# Patient Record
Sex: Female | Born: 1980
Health system: Southern US, Community
[De-identification: ages and names within clinical notes are randomized; demographics above are authoritative.]

## PROBLEM LIST (undated history)

## (undated) DIAGNOSIS — G43909 Migraine, unspecified, not intractable, without status migrainosus: Secondary | ICD-10-CM

## (undated) DIAGNOSIS — K219 Gastro-esophageal reflux disease without esophagitis: Secondary | ICD-10-CM

## (undated) DIAGNOSIS — F4310 Post-traumatic stress disorder, unspecified: Secondary | ICD-10-CM

## (undated) DIAGNOSIS — F32A Depression, unspecified: Secondary | ICD-10-CM

## (undated) DIAGNOSIS — R112 Nausea with vomiting, unspecified: Secondary | ICD-10-CM

## (undated) DIAGNOSIS — R002 Palpitations: Secondary | ICD-10-CM

## (undated) DIAGNOSIS — B009 Herpesviral infection, unspecified: Secondary | ICD-10-CM

## (undated) DIAGNOSIS — T7840XA Allergy, unspecified, initial encounter: Secondary | ICD-10-CM

## (undated) DIAGNOSIS — Z87448 Personal history of other diseases of urinary system: Secondary | ICD-10-CM

## (undated) DIAGNOSIS — G5600 Carpal tunnel syndrome, unspecified upper limb: Secondary | ICD-10-CM

## (undated) DIAGNOSIS — F909 Attention-deficit hyperactivity disorder, unspecified type: Secondary | ICD-10-CM

## (undated) DIAGNOSIS — Z8619 Personal history of other infectious and parasitic diseases: Secondary | ICD-10-CM

## (undated) DIAGNOSIS — Z9889 Other specified postprocedural states: Secondary | ICD-10-CM

## (undated) DIAGNOSIS — D649 Anemia, unspecified: Secondary | ICD-10-CM

## (undated) DIAGNOSIS — N83209 Unspecified ovarian cyst, unspecified side: Secondary | ICD-10-CM

## (undated) DIAGNOSIS — F17200 Nicotine dependence, unspecified, uncomplicated: Secondary | ICD-10-CM

## (undated) DIAGNOSIS — R0600 Dyspnea, unspecified: Secondary | ICD-10-CM

## (undated) DIAGNOSIS — R5382 Chronic fatigue, unspecified: Secondary | ICD-10-CM

## (undated) DIAGNOSIS — F329 Major depressive disorder, single episode, unspecified: Secondary | ICD-10-CM

## (undated) DIAGNOSIS — R87629 Unspecified abnormal cytological findings in specimens from vagina: Secondary | ICD-10-CM

## (undated) DIAGNOSIS — F419 Anxiety disorder, unspecified: Secondary | ICD-10-CM

## (undated) HISTORY — DX: Dyspnea, unspecified: R06.00

## (undated) HISTORY — DX: Palpitations: R00.2

## (undated) HISTORY — PX: WISDOM TOOTH EXTRACTION: SHX21

## (undated) HISTORY — DX: Personal history of other infectious and parasitic diseases: Z86.19

## (undated) HISTORY — DX: Major depressive disorder, single episode, unspecified: F32.9

## (undated) HISTORY — PX: LEEP: SHX91

## (undated) HISTORY — DX: Personal history of other diseases of urinary system: Z87.448

## (undated) HISTORY — DX: Anxiety disorder, unspecified: F41.9

## (undated) HISTORY — DX: Unspecified abnormal cytological findings in specimens from vagina: R87.629

## (undated) HISTORY — PX: BACK SURGERY: SHX140

## (undated) HISTORY — DX: Depression, unspecified: F32.A

## (undated) HISTORY — PX: OTHER SURGICAL HISTORY: SHX169

## (undated) HISTORY — DX: Migraine, unspecified, not intractable, without status migrainosus: G43.909

## (undated) HISTORY — DX: Allergy, unspecified, initial encounter: T78.40XA

---
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
## 2000-07-02 ENCOUNTER — Emergency Department (HOSPITAL_COMMUNITY): Admission: EM | Admit: 2000-07-02 | Discharge: 2000-07-03 | Payer: Self-pay | Admitting: Emergency Medicine

## 2000-07-03 ENCOUNTER — Encounter: Payer: Self-pay | Admitting: Emergency Medicine

## 2000-12-18 ENCOUNTER — Other Ambulatory Visit: Admission: RE | Admit: 2000-12-18 | Discharge: 2000-12-18 | Payer: Self-pay | Admitting: Family Medicine

## 2001-04-17 ENCOUNTER — Encounter: Payer: Self-pay | Admitting: Otolaryngology

## 2001-04-17 ENCOUNTER — Encounter: Admission: RE | Admit: 2001-04-17 | Discharge: 2001-04-17 | Payer: Self-pay | Admitting: Otolaryngology

## 2001-05-03 ENCOUNTER — Encounter (INDEPENDENT_AMBULATORY_CARE_PROVIDER_SITE_OTHER): Payer: Self-pay | Admitting: *Deleted

## 2001-05-03 ENCOUNTER — Ambulatory Visit (HOSPITAL_BASED_OUTPATIENT_CLINIC_OR_DEPARTMENT_OTHER): Admission: RE | Admit: 2001-05-03 | Discharge: 2001-05-03 | Payer: Self-pay | Admitting: Hematology and Oncology

## 2005-11-08 ENCOUNTER — Emergency Department (HOSPITAL_COMMUNITY): Admission: EM | Admit: 2005-11-08 | Discharge: 2005-11-08 | Payer: Self-pay | Admitting: Emergency Medicine

## 2007-12-06 ENCOUNTER — Inpatient Hospital Stay (HOSPITAL_COMMUNITY): Admission: AD | Admit: 2007-12-06 | Discharge: 2007-12-06 | Payer: Self-pay | Admitting: Obstetrics and Gynecology

## 2008-01-25 ENCOUNTER — Inpatient Hospital Stay (HOSPITAL_COMMUNITY): Admission: AD | Admit: 2008-01-25 | Discharge: 2008-01-25 | Payer: Self-pay | Admitting: Obstetrics and Gynecology

## 2008-02-08 ENCOUNTER — Inpatient Hospital Stay (HOSPITAL_COMMUNITY): Admission: AD | Admit: 2008-02-08 | Discharge: 2008-02-09 | Payer: Self-pay | Admitting: Obstetrics and Gynecology

## 2008-02-09 ENCOUNTER — Inpatient Hospital Stay (HOSPITAL_COMMUNITY): Admission: AD | Admit: 2008-02-09 | Discharge: 2008-02-11 | Payer: Self-pay | Admitting: Obstetrics and Gynecology

## 2008-02-14 ENCOUNTER — Inpatient Hospital Stay (HOSPITAL_COMMUNITY): Admission: AD | Admit: 2008-02-14 | Discharge: 2008-02-14 | Payer: Self-pay | Admitting: Obstetrics & Gynecology

## 2009-01-16 ENCOUNTER — Inpatient Hospital Stay (HOSPITAL_COMMUNITY): Admission: AD | Admit: 2009-01-16 | Discharge: 2009-01-16 | Payer: Self-pay | Admitting: Obstetrics & Gynecology

## 2009-01-27 ENCOUNTER — Encounter: Payer: Self-pay | Admitting: *Deleted

## 2009-02-06 ENCOUNTER — Inpatient Hospital Stay (HOSPITAL_COMMUNITY): Admission: AD | Admit: 2009-02-06 | Discharge: 2009-02-06 | Payer: Self-pay | Admitting: Obstetrics and Gynecology

## 2009-02-11 ENCOUNTER — Inpatient Hospital Stay (HOSPITAL_COMMUNITY): Admission: RE | Admit: 2009-02-11 | Discharge: 2009-02-12 | Payer: Self-pay | Admitting: Obstetrics and Gynecology

## 2010-02-25 ENCOUNTER — Encounter: Admission: RE | Admit: 2010-02-25 | Discharge: 2010-02-25 | Payer: Self-pay | Admitting: Otolaryngology

## 2010-04-25 ENCOUNTER — Encounter
Admission: RE | Admit: 2010-04-25 | Discharge: 2010-04-25 | Payer: Self-pay | Source: Home / Self Care | Attending: Emergency Medicine | Admitting: Emergency Medicine

## 2010-08-11 LAB — CBC
HCT: 32.2 % — ABNORMAL LOW (ref 36.0–46.0)
HCT: 35.1 % — ABNORMAL LOW (ref 36.0–46.0)
Hemoglobin: 10.7 g/dL — ABNORMAL LOW (ref 12.0–15.0)
Hemoglobin: 12 g/dL (ref 12.0–15.0)
MCHC: 33.3 g/dL (ref 30.0–36.0)
MCHC: 34.1 g/dL (ref 30.0–36.0)
MCV: 92.7 fL (ref 78.0–100.0)
MCV: 93.4 fL (ref 78.0–100.0)
Platelets: 176 10*3/uL (ref 150–400)
Platelets: 185 10*3/uL (ref 150–400)
RBC: 3.45 MIL/uL — ABNORMAL LOW (ref 3.87–5.11)
RBC: 3.79 MIL/uL — ABNORMAL LOW (ref 3.87–5.11)
RDW: 13.9 % (ref 11.5–15.5)
RDW: 14.2 % (ref 11.5–15.5)
WBC: 11.4 10*3/uL — ABNORMAL HIGH (ref 4.0–10.5)
WBC: 11.9 10*3/uL — ABNORMAL HIGH (ref 4.0–10.5)

## 2010-08-11 LAB — RPR: RPR Ser Ql: NONREACTIVE

## 2010-09-20 NOTE — H&P (Signed)
Abigail White, Abigail White              ACCOUNT NO.:  [id number]   MEDICAL RECORD NO.:  [id number]          PATIENT TYPE:  INP   LOCATION:  9126                          FACILITY:  [facility]   PHYSICIAN:  Lenoard Aden, M.D.DATE OF BIRTH:  08-19-1980   DATE OF ADMISSION:  02/09/2008  DATE OF DISCHARGE:                              HISTORY & PHYSICAL   CHIEF COMPLAINT:  Induction at 39 weeks.   Abigail White is a 30 year old white female G2, P0 at 39 and 5/7th weeks'  gestation by ultrasound, adjusted dates with unsure LMP who presents for  induction.  The patient currently lives in [place].  Her husband is  in the [employer].  Given her advanced distance 2-1/2 hours away from the  hospital, her advanced dilatation of 4-5 cm, her husband is need to be  out of town and deployed [employer] as of this week, and her GBS  positivity; a decision is made to proceed with induction at 39 weeks.   SOCIAL HISTORY:  Abigail White is a reformed smoker.  Abigail White is a nondrinker.  Abigail White  denies any current domestic or physical violence.   ALLERGIES:  1. CODEINE.  2. PENICILLIN.  3. LATEX.   Abigail White has a personal history of situational depression and no other  medical or surgical issues.   Abigail White has 1 uncomplicated TAB in 1999 at 10 weeks.   FAMILY HISTORY:  Heart disease, hypertension, diabetes, bone and breast  cancer, kidney stones, stroke, thyroid disease, and kidney disease.   Prenatal course has been complicated by preterm labor, advanced  dilatation, and GBS positivity.   PHYSICAL EXAMINATION:  GENERAL:  Abigail White is a well-developed and well-  nourished white female, in no acute distress.  HEENT:  Normal.  NECK:  Supple.  Full range of motion.  LUNGS:  Clear.  HEART:  Regular rhythm.  ABDOMEN:  Soft, gravid, and nontender.  Estimated fetal weight is 7-1/2  to 8 pounds.  Cervix is 4-5 cm, 80% vertex, -1, soft and mid position.  EXTREMITIES:  There are no cords.  NEUROLOGIC:  Nonfocal.  SKIN:  Intact.   IMPRESSION:  1. A 39-week intrauterine pregnancy.  2. Advanced geographic distance from the hospital.  3. Social indications to include husband deployed in the [employer].  4. Advanced dilatation.  5. Group B streptococcus positive.   PLAN:  Proceed with Pitocin induction and epidural as needed.  Anticipate attempts at vaginal delivery.      Lenoard Aden, M.D.  Electronically Signed     RJT/MEDQ  D:  02/09/2008  T:  02/10/2008  Job:  [id number]

## 2010-09-20 NOTE — H&P (Signed)
NAMESHELIE, Abigail White              ACCOUNT NO.:  [id number]   MEDICAL RECORD NO.:  [id number]          PATIENT TYPE:  MAT   LOCATION:  MATC                          [facility]:  [facility]   PHYSICIAN:  Lenoard Aden, M.D.DATE OF BIRTH:  1980-11-30   DATE OF ADMISSION:  01/25/2008  DATE OF DISCHARGE:  01/25/2008                              HISTORY & PHYSICAL   CHIEF COMPLAINT:  Labor.   She is a 26-year white female, G2, P0, 36 weeks' gestation with  increased frequency of contractions, questionable leakage of fluid.   ALLERGIES:  To CODEINE, PENICILLIN, LATEX.   MEDICATIONS:  1. Procardia as needed.  2. Preterm vitamins.  3. Tums.   She is a nonsmoker, nondrinker.  Denies domestic or physical violations.  She has a family history of heart disease, hypertension, bone and breast  cancer, stroke, kidney stones, thyroid disease.  Prenatal course  complicated by preterm cervical change.   PHYSICAL EXAMINATION:  GENERAL:  She is uncomfortable-appearing white  female, in no acute distress.  HEENT: Normal lungs, clear.  HEART: Regular rhythm.  ABDOMEN:  Soft, gravid, nontender.  Cervix is 2 cm, 60%, vertex -1.  EXTREMITIES:  There are no cords.  NEUROLOGICAL:  Nonfocal.  SKIN:  Intact.   NST is reactive.  Her fetal heart sounds 50 beats per minute,  accelerations noted, no decelerations, irregular contractions noted.   IMPRESSION:  1. A 36 weeks' intrauterine pregnancy.  2. Prodromal labor pattern, no evidence of cervical change.   PLAN:  Discharge home, preterm labor warning is given.  Procardia p.r.n.  until 37 weeks, followup in the office as scheduled.      Lenoard Aden, M.D.  Electronically Signed     RJT/MEDQ  D:  01/25/2008  T:  01/25/2008  Job:  [id number]

## 2010-09-23 NOTE — Op Note (Signed)
[place]  Patient:    Abigail White, Abigail White Visit Number: [id number] MRN: [id number]          Service Type: DSU Location: [facility] Attending Physician:  Dalene Seltzer Dictated by:   Jeannett Senior Pollyann Kennedy, M.D. Proc. Date: 05/03/01 Admit Date:  05/03/2001   CC:         Elvina Sidle, M.D.   Operative Report  PREOPERATIVE DIAGNOSIS:  Chronic ethmoid, maxillary, and frontal sinusitis.  POSTOPERATIVE DIAGNOSIS:  Chronic ethmoid, maxillary, and frontal sinusitis.  PROCEDURES: 1. Bilateral total endoscopic ethmoidectomy. 2. Bilateral endoscopic maxillary antrostomy. 3. Bilateral endoscopic frontal sinusotomy.  SURGEON:  Jefry H. Pollyann Kennedy, M.D.  ANESTHESIA:  General endotracheal anesthesia.  COMPLICATIONS:  None.  ESTIMATED BLOOD LOSS:  50 cc.  FINDINGS:  Diffuse inflammatory changes of mucosa involving the anterior and posterior ethmoid cell, the maxillary sinuses and the frontal sinuses bilaterally.  There was polypoid change of the mucosa and isolated large polyps particularly in the frontal sinus recess area bilaterally.  There was severely thickened and inflamed polypoid mucosa of the right maxillary sinus. There were multiple ethmoid cells that were filled with inspissated mucus.  REFERRING PHYSICIAN:  Dr. Elvina Sidle.  The patient tolerated the procedure well and was awakened, extubated, and transferred to recovery in stable condition.  INDICATION FOR PROCEDURE:  This is a 30 year old with a history of chronic pansinusitis, who has failed prolonged medical management with antibiotic therapy.  Risks, benefits, alternatives, and complications of the procedure were explained to the patient, who seemed to understand and agreed to surgery.  PROCEDURE:  The patient was taken to the operating room, placed on the operating table in supine position.  Following induction of general endotracheal anesthesia, the patient was prepped and draped  in a standard fashion.  #1 - Bilateral total endoscopic ethmoidectomy.  Oxymetazoline spray was used preoperatively in the nose.  Xylocaine 1% with epinephrine was infiltrated into the superior and posterior attachments of the middle turbinates bilaterally.  It was also infiltrated into the lateral nasal wall in the infundibular area.  The uncinate process was taken down using a sickle knife on both sides.  This allowed easier access into the infundibulum and exposure of the ethmoid bullae.  The ethmoid bullae were opened and cleaned of thickened mucosa.  A complete ethmoidectomy was then performed using a combination of sinus endoscopy forceps and the microdebrider.  The lateral limit of dissection was the lamina papyracea, which was kept intact bilaterally.  The superior limit of dissection was the fovea ethmoidalis laterally and the cribriform plate medially, which was also kept intact.  The posterior limit of dissection after passing through the ground lamella on both sides and opening the posterior ethmoid cells was the face of the sphenoid, which was kept intact.  The sphenoid ostium was identified on the left side, but was left unmolested as there was no history of chronic sphenoid disease. The ostium was not identified specifically on the right side.  The middle turbinate was thin and kept intact bilaterally.  There was mild to moderate deviation of the superior septum towards the right side, which made access into the right ethmoid slightly difficult at first, but after cleaning out the lateral cells made it much easier and there was sufficient room for the dissection.  #2 - Bilateral endoscopic maxillary antrostomy.  After the uncinectomy was performed, then the bulla was opened.  The natural ostium of the maxillary sinus was easily identified bilaterally.  It was  entered with a curved suction.  A large amount of inspissated mucus was aspirated from the left side, the right  side had chronic polypoid changes.  The ostium was enlarged anteriorly using the backbiting forceps up to the lacrimal bone and posteriorly using a straight through-cut forcep back to the confluence with the laminae papyracea.  #3 - Bilateral endoscopic frontal sinusotomy.  After having completed the anterior aspect of the ethmoid dissection, the frontal recess area was easily identified using the 30 degree scope and curved suction.  There was a large amount of polypoid tissue on both sides that was cleaned out from within the inferior aspect of the frontal sinus.  The bony ledge that was arising from anterior to the frontal sinus recess was taken down with a up-angled Wilde-Blakesley forceps to allow easier access into the frontal sinus on both sides.  The ethmoid cavities were suctioned of debris and blood and packed with Kennedy packs inflated with the local anesthetic solution.  The nasal cavity and pharynx were suctioned of blood and secretions and the patient was then awakened, extubated and transferred to recovery room. Dictated by:   Jeannett Senior Pollyann Kennedy, M.D. Attending Physician:  Dalene Seltzer DD:  05/03/01 TD:  05/03/01 Job: [id number]

## 2010-10-11 ENCOUNTER — Other Ambulatory Visit: Payer: Self-pay | Admitting: Obstetrics and Gynecology

## 2011-02-06 LAB — URINALYSIS, ROUTINE W REFLEX MICROSCOPIC
Bilirubin Urine: NEGATIVE
Glucose, UA: NEGATIVE
Hgb urine dipstick: NEGATIVE
Ketones, ur: NEGATIVE
Nitrite: NEGATIVE
Protein, ur: NEGATIVE
Specific Gravity, Urine: <1.005 — ABNORMAL LOW
Urobilinogen, UA: 0.2
pH: 7

## 2011-02-07 LAB — RH IMMUNE GLOB WKUP(>/=20WKS)(NOT WOMEN'S HOSP): Fetal Screen: NEGATIVE

## 2011-02-07 LAB — CBC
HCT: 30 — ABNORMAL LOW
HCT: 33.8 — ABNORMAL LOW
Hemoglobin: 10.1 — ABNORMAL LOW
Hemoglobin: 11.3 — ABNORMAL LOW
MCHC: 33.4
MCHC: 33.8
MCV: 90.8
MCV: 93.3
Platelets: 181
Platelets: 243
RBC: 3.21 — ABNORMAL LOW
RBC: 3.73 — ABNORMAL LOW
RDW: 12.3
RDW: 12.8
WBC: 11.2 — ABNORMAL HIGH
WBC: 15.9 — ABNORMAL HIGH

## 2011-02-07 LAB — RPR: RPR Ser Ql: NONREACTIVE

## 2011-08-11 ENCOUNTER — Encounter: Payer: Self-pay | Admitting: *Deleted

## 2011-08-11 DIAGNOSIS — R002 Palpitations: Secondary | ICD-10-CM | POA: Insufficient documentation

## 2011-08-11 DIAGNOSIS — R079 Chest pain, unspecified: Secondary | ICD-10-CM | POA: Insufficient documentation

## 2011-08-11 DIAGNOSIS — F419 Anxiety disorder, unspecified: Secondary | ICD-10-CM | POA: Insufficient documentation

## 2011-08-11 DIAGNOSIS — F329 Major depressive disorder, single episode, unspecified: Secondary | ICD-10-CM | POA: Insufficient documentation

## 2011-08-11 DIAGNOSIS — F32A Depression, unspecified: Secondary | ICD-10-CM | POA: Insufficient documentation

## 2011-08-11 DIAGNOSIS — R0600 Dyspnea, unspecified: Secondary | ICD-10-CM | POA: Insufficient documentation

## 2011-09-07 ENCOUNTER — Other Ambulatory Visit: Payer: Self-pay | Admitting: Dermatology

## 2011-09-07 ENCOUNTER — Ambulatory Visit (INDEPENDENT_AMBULATORY_CARE_PROVIDER_SITE_OTHER): Payer: 59 | Admitting: Family Medicine

## 2011-09-07 VITALS — BP 94/58 | HR 78 | Temp 98.6°F | Resp 16 | Ht 67.0 in | Wt 159.0 lb

## 2011-09-07 DIAGNOSIS — G5600 Carpal tunnel syndrome, unspecified upper limb: Secondary | ICD-10-CM

## 2011-09-07 NOTE — Progress Notes (Signed)
  Subjective:    Patient ID: Abigail White, female    DOB: April 06, 1981, 31 y.o.   MRN: [id number]  HPI  Patient presents complaining of numbness in (B) wrists Wrist pain present throughout the day worse at night. Intermittent paresthesias (B) 4 th and 5 th digit  Dx with carpal tunnel in the past; given splints in the past however cannot locate them.  Has never had NCS  SH/ Dialysis RN at [facility]  Review of Systems     Objective:   Physical Exam  Constitutional: She appears well-developed.  Neck: No thyromegaly present.  Cardiovascular: Normal rate, regular rhythm and normal heart sounds.   Pulmonary/Chest: Effort normal and breath sounds normal.  Neurological: She is alert.       Positive tinels and phalens (B) No evidence of thenar wasting          Assessment & Plan:   1. Carpal tunnel syndrome  Wrist splint, Ambulatory referral to Orthopedic Surgery   Anticipatory guidance

## 2011-10-24 ENCOUNTER — Ambulatory Visit (INDEPENDENT_AMBULATORY_CARE_PROVIDER_SITE_OTHER): Payer: 59 | Admitting: Emergency Medicine

## 2011-10-24 VITALS — BP 102/65 | HR 86 | Temp 97.9°F | Resp 18 | Ht 68.0 in | Wt 165.0 lb

## 2011-10-24 DIAGNOSIS — K2100 Gastro-esophageal reflux disease with esophagitis, without bleeding: Secondary | ICD-10-CM

## 2011-10-24 MED ORDER — ESOMEPRAZOLE MAGNESIUM 40 MG PO CPDR: 40.0000 mg | DELAYED_RELEASE_CAPSULE | Freq: Every day | ORAL | Status: DC

## 2011-10-24 MED ORDER — SUCRALFATE 1 G PO TABS: 1.0000 g | ORAL_TABLET | Freq: Four times a day (QID) | ORAL | Status: DC

## 2011-10-24 NOTE — Progress Notes (Signed)
  Subjective:    Patient ID: Abigail White, female    DOB: 1980/09/01, 31 y.o.   MRN: [id number]  Gastrophageal Reflux She complains of abdominal pain, coughing, dysphagia and heartburn. She reports no belching, no chest pain, no choking, no early satiety, no globus sensation, no hoarse voice, no nausea, no sore throat, no stridor, no tooth decay, no water brash or no wheezing. This is a new problem. The current episode started 1 to 4 weeks ago. The problem occurs constantly. The problem has been unchanged. The heartburn duration is an hour. The heartburn is located in the substernum. The heartburn is of moderate intensity. The heartburn wakes her from sleep. The heartburn does not limit her activity. The heartburn doesn't change with position. The symptoms are aggravated by lying down. Pertinent negatives include no anemia, fatigue, melena, muscle weakness, orthopnea or weight loss. Risk factors include no known risk factors. She has tried an antacid for the symptoms. The treatment provided no relief. Past procedures do not include an abdominal ultrasound, esophageal manometry, H. pylori antibody titer or a UGI.      Review of Systems  Constitutional: Positive for activity change, appetite change and unexpected weight change. Negative for fever, chills, weight loss and fatigue.  HENT: Negative.  Negative for sore throat and hoarse voice.   Eyes: Negative.   Respiratory: Positive for cough. Negative for choking and wheezing.   Cardiovascular: Negative.  Negative for chest pain.  Gastrointestinal: Positive for heartburn, dysphagia and abdominal pain. Negative for nausea, vomiting, diarrhea, constipation, blood in stool, melena and anal bleeding.  Genitourinary: Negative.   Musculoskeletal: Negative.  Negative for muscle weakness.  Skin: Negative.   All other systems reviewed and are negative.       Objective:   Physical Exam  Nursing note and vitals reviewed. Constitutional: She is  oriented to person, place, and time. She appears well-developed and well-nourished.  HENT:  Head: Normocephalic and atraumatic.  Right Ear: External ear normal.  Left Ear: External ear normal.  Eyes: Conjunctivae are normal. Pupils are equal, round, and reactive to light.  Neck: Normal range of motion. Neck supple.  Cardiovascular: Regular rhythm and normal heart sounds.   Pulmonary/Chest: Effort normal and breath sounds normal.  Abdominal: Soft.  Musculoskeletal: Normal range of motion.  Neurological: She is alert and oriented to person, place, and time.  Skin: Skin is warm and dry.          Assessment & Plan:  History of GERD treated previously with protonix F/U with [facility]MD

## 2011-11-06 ENCOUNTER — Ambulatory Visit (INDEPENDENT_AMBULATORY_CARE_PROVIDER_SITE_OTHER): Payer: 59 | Admitting: Family Medicine

## 2011-11-06 VITALS — BP 114/72 | HR 78 | Temp 98.3°F | Resp 16 | Ht 68.0 in | Wt 167.0 lb

## 2011-11-06 DIAGNOSIS — K219 Gastro-esophageal reflux disease without esophagitis: Secondary | ICD-10-CM

## 2011-11-06 DIAGNOSIS — J329 Chronic sinusitis, unspecified: Secondary | ICD-10-CM

## 2011-11-06 DIAGNOSIS — R059 Cough, unspecified: Secondary | ICD-10-CM

## 2011-11-06 DIAGNOSIS — M2550 Pain in unspecified joint: Secondary | ICD-10-CM

## 2011-11-06 DIAGNOSIS — R635 Abnormal weight gain: Secondary | ICD-10-CM

## 2011-11-06 DIAGNOSIS — R05 Cough: Secondary | ICD-10-CM

## 2011-11-06 LAB — POCT CBC
Granulocyte percent: 72.1 %G (ref 37–80)
HCT, POC: 45.3 % (ref 37.7–47.9)
Hemoglobin: 14.2 g/dL (ref 12.2–16.2)
Lymph, poc: 3.4 (ref 0.6–3.4)
MCH, POC: 30.3 pg (ref 27–31.2)
MCHC: 31.3 g/dL — AB (ref 31.8–35.4)
MCV: 96.5 fL (ref 80–97)
MID (cbc): 0.8 (ref 0–0.9)
MPV: 8.5 fL (ref 0–99.8)
POC Granulocyte: 11 — AB (ref 2–6.9)
POC LYMPH PERCENT: 22.5 %L (ref 10–50)
POC MID %: 5.4 %M (ref 0–12)
Platelet Count, POC: 347 10*3/uL (ref 142–424)
RBC: 4.69 M/uL (ref 4.04–5.48)
RDW, POC: 11.8 %
WBC: 15.2 10*3/uL — AB (ref 4.6–10.2)

## 2011-11-06 LAB — POCT SEDIMENTATION RATE: POCT SED RATE: 26 mm/hr — AB (ref 0–22)

## 2011-11-06 MED ORDER — LEVOFLOXACIN 500 MG PO TABS: 500.0000 mg | ORAL_TABLET | Freq: Every day | ORAL | Status: AC

## 2011-11-06 MED ORDER — HYDROCOD POLST-CHLORPHEN POLST 10-8 MG/5ML PO LQCR: 5.0000 mL | Freq: Every evening | ORAL | Status: DC | PRN

## 2011-11-06 MED ORDER — SUCRALFATE 1 GM/10ML PO SUSP: 1.0000 g | Freq: Four times a day (QID) | ORAL | Status: AC

## 2011-11-06 MED ORDER — PANTOPRAZOLE SODIUM 40 MG PO TBEC: 40.0000 mg | DELAYED_RELEASE_TABLET | Freq: Every day | ORAL | Status: DC

## 2011-11-06 NOTE — Progress Notes (Signed)
Gastrophageal Reflux  She complains of abdominal pain, coughing, dysphagia and heartburn. She reports no belching, no chest pain, no choking, no early satiety, no globus sensation, no hoarse voice, no nausea, no sore throat, no stridor, no tooth decay, no water brash or no wheezing. This is a new problem. The current episode started 1 to 4 weeks ago. The problem occurs constantly. The problem has been unchanged. The heartburn duration is an hour. The heartburn is located in the substernum. The heartburn is of moderate intensity. The heartburn wakes her from sleep. The heartburn does not limit her activity. The heartburn doesn't change with position. The symptoms are aggravated by lying down. Pertinent negatives include no anemia, fatigue, melena, muscle weakness, orthopnea or weight loss. Risk factors include no known risk factors. She has tried an antacid for the symptoms. The treatment provided no relief. Past procedures do not include an abdominal ultrasound, esophageal manometry, H. pylori antibody titer or a UGI.   Patient has been gaining weight since she moved in January.  She keeps gaining and is  Very upset.  She asked gynecologist for help but no help forthcoming.  Mother had thyroid problem in her thirties. She has stopped eating in bed.  Patient has two weeks of sinus congestion.  Thick green mucous has been associated.  Having chest pains and increasing cough.  Still smoking, but trying to quit  Also, has hand pain. Both writst, baseof thumbs.  Seeing orthopedic on July 8th.  Using brace.  Positive f/h for fibromyalgia  Objective: HEENT:  Thick mucous both nasal disch.  Otherwise neg Chest:  No rales. Heart:  Reg, no murmur Ext:  No edema Skin: normal  Assessment:  Sinus, GERD, weight  Gain  Plan:    1. Sinusitis  POCT CBC, levofloxacin (LEVAQUIN) 500 MG tablet  2. Weight gain  TSH  3. GERD (gastroesophageal reflux disease)  sucralfate (CARAFATE) 1 GM/10ML suspension, pantoprazole  (PROTONIX) 40 MG tablet  4. Cough  chlorpheniramine-HYDROcodone (TUSSIONEX PENNKINETIC ER) 10-8 MG/5ML [facility]

## 2011-11-07 LAB — TSH: TSH: 1.013 u[IU]/mL (ref 0.350–4.500)

## 2011-11-08 LAB — ANA: Anti Nuclear Antibody(ANA): NEGATIVE

## 2011-11-08 LAB — RHEUMATOID FACTOR: Rhuematoid fact SerPl-aCnc: <10 IU/mL (ref <=14)

## 2011-12-31 ENCOUNTER — Ambulatory Visit (HOSPITAL_COMMUNITY)
Admission: RE | Admit: 2011-12-31 | Discharge: 2011-12-31 | Disposition: A | Discharge status: Home / Self Care | Payer: 59 | Source: Ambulatory Visit | Attending: Family Medicine | Admitting: Family Medicine

## 2011-12-31 ENCOUNTER — Ambulatory Visit (HOSPITAL_COMMUNITY)
Admission: EM | Admit: 2011-12-31 | Discharge: 2011-12-31 | Disposition: A | Discharge status: Home / Self Care | Payer: 59 | Source: Ambulatory Visit | Attending: Family Medicine | Admitting: Family Medicine

## 2011-12-31 ENCOUNTER — Ambulatory Visit (INDEPENDENT_AMBULATORY_CARE_PROVIDER_SITE_OTHER): Payer: 59 | Admitting: Family Medicine

## 2011-12-31 ENCOUNTER — Ambulatory Visit: Payer: 59

## 2011-12-31 VITALS — BP 116/72 | HR 111 | Temp 98.5°F | Resp 16 | Ht 67.0 in | Wt 161.0 lb

## 2011-12-31 DIAGNOSIS — R1031 Right lower quadrant pain: Secondary | ICD-10-CM

## 2011-12-31 DIAGNOSIS — X58XXXA Exposure to other specified factors, initial encounter: Secondary | ICD-10-CM | POA: Insufficient documentation

## 2011-12-31 DIAGNOSIS — N83209 Unspecified ovarian cyst, unspecified side: Secondary | ICD-10-CM | POA: Insufficient documentation

## 2011-12-31 DIAGNOSIS — R110 Nausea: Secondary | ICD-10-CM

## 2011-12-31 DIAGNOSIS — T8339XA Other mechanical complication of intrauterine contraceptive device, initial encounter: Secondary | ICD-10-CM | POA: Insufficient documentation

## 2011-12-31 DIAGNOSIS — D72829 Elevated white blood cell count, unspecified: Secondary | ICD-10-CM

## 2011-12-31 LAB — POCT URINALYSIS DIPSTICK
Bilirubin, UA: NEGATIVE
Blood, UA: NEGATIVE
Glucose, UA: NEGATIVE
Ketones, UA: NEGATIVE
Nitrite, UA: NEGATIVE
Protein, UA: NEGATIVE
Spec Grav, UA: 1.02
Urobilinogen, UA: 0.2
pH, UA: 7

## 2011-12-31 LAB — POCT CBC
Granulocyte percent: 90.1 %G — AB (ref 37–80)
HCT, POC: 46 % (ref 37.7–47.9)
Hemoglobin: 14.3 g/dL (ref 12.2–16.2)
Lymph, poc: 1.3 (ref 0.6–3.4)
MCH, POC: 30 pg (ref 27–31.2)
MCHC: 31.1 g/dL — AB (ref 31.8–35.4)
MCV: 96.4 fL (ref 80–97)
MID (cbc): 0.4 (ref 0–0.9)
MPV: 9.4 fL (ref 0–99.8)
POC Granulocyte: 15.5 — AB (ref 2–6.9)
POC LYMPH PERCENT: 7.7 %L — AB (ref 10–50)
POC MID %: 2.2 %M (ref 0–12)
Platelet Count, POC: 327 10*3/uL (ref 142–424)
RBC: 4.77 M/uL (ref 4.04–5.48)
RDW, POC: 12.4 %
WBC: 17.2 10*3/uL — AB (ref 4.6–10.2)

## 2011-12-31 LAB — POCT UA - MICROSCOPIC ONLY
Bacteria, U Microscopic: NEGATIVE
Casts, Ur, LPF, POC: NEGATIVE
Crystals, Ur, HPF, POC: NEGATIVE
Mucus, UA: NEGATIVE
Yeast, UA: NEGATIVE

## 2011-12-31 MED ORDER — IOHEXOL 300 MG/ML  SOLN
100.0000 mL | Freq: Once | INTRAMUSCULAR | Status: AC | PRN
  Administered 2011-12-31: 100 mL via INTRAVENOUS

## 2011-12-31 MED ORDER — IOHEXOL 300 MG/ML  SOLN
40.0000 mL | Freq: Once | INTRAMUSCULAR | Status: AC | PRN
  Administered 2011-12-31: 40 mL via ORAL

## 2011-12-31 MED ORDER — TRAMADOL HCL 50 MG PO TABS: 50.0000 mg | ORAL_TABLET | Freq: Three times a day (TID) | ORAL | Status: AC | PRN

## 2011-12-31 MED ORDER — CIPROFLOXACIN HCL 500 MG PO TABS: 500.0000 mg | ORAL_TABLET | Freq: Two times a day (BID) | ORAL | Status: AC

## 2011-12-31 NOTE — Progress Notes (Signed)
[facility]:  Office Visit  Chief Complaint:  Chief Complaint  Patient presents with  . Abdominal Pain    RLQ since yesterday afternoon    HPI: Abigail White is a 31 y.o. female who complains of  1 day h/o RLQ abd pain, sharp. Denies fevers, chills. + nausea. Tried Tramadol without relief. No urinary or vagina sxs. Denies ovarian cyst. Denies  H/o abdominal surgery, ob/gyn thought she may have had endometriosis and was planning to do what sounds like an exploratory lap but she did not do it. Feels warm. Tried leftover levaquin and 1 pill of prednisone without relief because she thought she had a URI.   Past Medical History  Diagnosis Date  . Palpitations     H/O  . Dyspnea     H/O  . Chest pain     H/O  . Anxiety     H/O   No past surgical history on file. History   Social History  . Marital Status: Married    Spouse Name: N/A    Number of Children: N/A  . Years of Education: N/A   Occupational History  . nusre  Other   Social History Main Topics  . Smoking status: Current Everyday Smoker  . Smokeless tobacco: Never Used  . Alcohol Use: Yes     rarely  . Drug Use: No  . Sexually Active: None   Other Topics Concern  . None   Social History Narrative  . None   Family History  Problem Relation Age of Onset  . Coronary artery disease Mother 72  . Hypertension Mother   . Diabetes Mother   . Heart disease Mother   . Hypertension Other   . Diabetes Other   . Other Father     Shot-violence  . Hypertension Brother   . Hypertension Brother    Allergies  Allergen Reactions  . Codeine   . Latex   . Penicillins   . Sulfa Antibiotics   . Differin (Adapalene) Rash   Prior to Admission medications   Medication Sig Start Date End Date Taking? Authorizing Provider  ALPRAZolam Prudy Feeler) 0.5 MG tablet Take 0.5 mg by mouth as needed.   Yes Historical Provider, MD  esomeprazole (NEXIUM) 40 MG capsule Take 1 capsule (40 mg total) by mouth daily.  10/24/11 10/23/12 Yes Phillips Odor, MD  levonorgestrel (MIRENA) 20 MCG/24HR IUD 1 each by Intrauterine route once.   Yes Historical Provider, MD  lisdexamfetamine (VYVANSE) 20 MG capsule Take 20 mg by mouth daily.   Yes Historical Provider, MD  methocarbamol (ROBAXIN) 500 MG tablet Take 500 mg by mouth 2 (two) times daily.   Yes Historical Provider, MD  SUMAtriptan (IMITREX) 100 MG tablet Take 100 mg by mouth every 2 (two) hours as needed.   Yes Historical Provider, MD  ValACYclovir HCl (VALTREX PO) Take by mouth daily.   Yes Historical Provider, MD     ROS: The patient denies fevers, chills, night sweats, unintentional weight loss, chest pain, palpitations, wheezing, dyspnea on exertion, vomiting,  dysuria, hematuria, melena, numbness, weakness, or tingling. + nausea, abd pain  All other systems have been reviewed and were otherwise negative with the exception of those mentioned in the HPI and as above.    PHYSICAL EXAM: Filed Vitals:   12/31/11 1619  BP: 116/72  Pulse: 111  Temp: 98.5 F (36.9 C)  Resp: 16   Filed Vitals:   12/31/11 1619  Height: 5\' 7"  (1.702 m)  Weight: 161 lb (73.029 kg)   Body mass index is 25.22 kg/(m^2).  General: Alert, mild distress HEENT:  Normocephalic, atraumatic, oropharynx patent.  Cardiovascular:  Regular rate and rhythm, no rubs murmurs or gallops.  No Carotid bruits, radial pulse intact. No pedal edema.  Respiratory: Clear to auscultation bilaterally.  No wheezes, rales, or rhonchi.  No cyanosis, no use of accessory musculature GI: No organomegaly, abdomen is soft, + exquisitely tender RLQ, positive bowel sounds. + guarding. No rebound.   No masses. Skin: No rashes. Neurologic: Facial musculature symmetric. Psychiatric: Patient is appropriate throughout our interaction. Lymphatic: No cervical lymphadenopathy Musculoskeletal: Gait intact.   LABS: Results for orders placed in visit on 12/31/11  POCT CBC      Component Value Range   WBC  17.2 (*) 4.6 - 10.2 K/uL   Lymph, poc 1.3  0.6 - 3.4   POC LYMPH PERCENT 7.7 (*) 10 - 50 %L   MID (cbc) 0.4  0 - 0.9   POC MID % 2.2  0 - 12 %M   POC Granulocyte 15.5 (*) 2 - 6.9   Granulocyte percent 90.1 (*) 37 - 80 %G   RBC 4.77  4.04 - 5.48 M/uL   Hemoglobin 14.3  12.2 - 16.2 g/dL   HCT, POC 40.9  81.1 - 47.9 %   MCV 96.4  80 - 97 fL   MCH, POC 30.0  27 - 31.2 pg   MCHC 31.1 (*) 31.8 - 35.4 g/dL   RDW, POC 91.4     Platelet Count, POC 327  142 - 424 K/uL   MPV 9.4  0 - 99.8 fL  POCT UA - MICROSCOPIC ONLY      Component Value Range   WBC, Ur, HPF, POC 0-3     RBC, urine, microscopic 0-1     Bacteria, U Microscopic negative     Mucus, UA negative     Epithelial cells, urine per micros 0-1     Crystals, Ur, HPF, POC negative     Casts, Ur, LPF, POC negative     Yeast, UA negative    POCT URINALYSIS DIPSTICK      Component Value Range   Color, UA yellow     Clarity, UA clear     Glucose, UA negative     Bilirubin, UA negative     Ketones, UA negative     Spec Grav, UA 1.020     Blood, UA negative     pH, UA 7.0     Protein, UA negative     Urobilinogen, UA 0.2     Nitrite, UA negative     Leukocytes, UA Trace       EKG/XRAY:   Primary read interpreted by Dr. Conley Rolls at [facility]. Nonobstructive gas patterns, no free gas   ASSESSMENT/PLAN: Encounter Diagnosis  Name Primary?  . Abdominal pain, acute, right lower quadrant Yes   Stat CT abdomen to rule out appendicitis UA does not explain sxs and history Leukocytosis may be related to 1 x use of prednisone pill but I don't think that 1 pill would elevate her white count that high. She is tender and guarding in the RLQ of her abdomen.  If CT negative for appendicitis then consider antibiotic for coverage of leukocytosis ? UTI  (trace leuks on UA)     Nerine Pulse PHUONG, DO 12/31/2011 5:47 PM

## 2012-01-01 ENCOUNTER — Telehealth: Payer: Self-pay

## 2012-01-01 ENCOUNTER — Other Ambulatory Visit: Payer: Self-pay | Admitting: Family Medicine

## 2012-01-01 DIAGNOSIS — R110 Nausea: Secondary | ICD-10-CM

## 2012-01-01 LAB — COMPREHENSIVE METABOLIC PANEL
ALT: 12 U/L (ref 0–35)
AST: 16 U/L (ref 0–37)
Albumin: 4.8 g/dL (ref 3.5–5.2)
Alkaline Phosphatase: 64 U/L (ref 39–117)
BUN: 13 mg/dL (ref 6–23)
CO2: 21 mEq/L (ref 19–32)
Calcium: 10 mg/dL (ref 8.4–10.5)
Chloride: 107 mEq/L (ref 96–112)
Creat: 0.68 mg/dL (ref 0.50–1.10)
Glucose, Bld: 98 mg/dL (ref 70–99)
Potassium: 4.4 mEq/L (ref 3.5–5.3)
Sodium: 140 mEq/L (ref 135–145)
Total Bilirubin: 0.3 mg/dL (ref 0.3–1.2)
Total Protein: 7.4 g/dL (ref 6.0–8.3)

## 2012-01-01 MED ORDER — ONDANSETRON 4 MG PO TBDP: 4.0000 mg | ORAL_TABLET | Freq: Three times a day (TID) | ORAL | Status: AC | PRN

## 2012-01-01 NOTE — Telephone Encounter (Signed)
Labs sent

## 2012-01-01 NOTE — Telephone Encounter (Signed)
DEBBIE FROM THE MD OFFICE STATES PT WAS SEEN HERE ON THE WEEKEND AND THEY NEED TO KNOW THE LAB REPORT PT IS IN OFFICE NOW. PLEASE FAX TO [phone] AND THE PHONE NUMBER [place] [phone]   PT IN OFFICE NOW WAITING

## 2012-01-01 NOTE — Telephone Encounter (Signed)
Pt called to state dr Conley Rolls just called her with results and let us know dr L doesn't need to call now.  bf

## 2012-01-01 NOTE — Telephone Encounter (Signed)
PATIENT WAS TOLD BY DR. LE THAT HER REPORT WOULD BE READ TODAY AND THAT SHE WOULD GET A CALL WITH THE RESULTS.  DR. LE IS NOT HERE TODAY AND SHE WANTS TO KNOW IF DR. L CAN CALL HER BACK.  SHE IS STILL HAVING A LOT OF PROBLEMS AND THE OBGYN CANT FIGURE OUT WHAT IS WRONG WITH HER EITHER.  PLEASE CALL ASAP  [id number]

## 2012-01-30 ENCOUNTER — Other Ambulatory Visit: Payer: Self-pay

## 2012-01-30 MED ORDER — IBUPROFEN 600 MG PO TABS: 600.0000 mg | ORAL_TABLET | Freq: Three times a day (TID) | ORAL | Status: DC | PRN

## 2012-06-03 ENCOUNTER — Ambulatory Visit (INDEPENDENT_AMBULATORY_CARE_PROVIDER_SITE_OTHER): Payer: 59 | Admitting: Family Medicine

## 2012-06-03 ENCOUNTER — Ambulatory Visit: Payer: 59

## 2012-06-03 VITALS — BP 112/71 | HR 102 | Temp 98.7°F | Resp 18 | Ht 67.5 in | Wt 160.0 lb

## 2012-06-03 DIAGNOSIS — F411 Generalized anxiety disorder: Secondary | ICD-10-CM

## 2012-06-03 DIAGNOSIS — M542 Cervicalgia: Secondary | ICD-10-CM

## 2012-06-03 DIAGNOSIS — G43709 Chronic migraine without aura, not intractable, without status migrainosus: Secondary | ICD-10-CM

## 2012-06-03 DIAGNOSIS — F419 Anxiety disorder, unspecified: Secondary | ICD-10-CM

## 2012-06-03 DIAGNOSIS — R110 Nausea: Secondary | ICD-10-CM

## 2012-06-03 MED ORDER — ONDANSETRON 4 MG PO TBDP: 4.0000 mg | ORAL_TABLET | Freq: Three times a day (TID) | ORAL | Status: DC | PRN

## 2012-06-03 MED ORDER — CYCLOBENZAPRINE HCL 5 MG PO TABS: 5.0000 mg | ORAL_TABLET | Freq: Three times a day (TID) | ORAL | Status: DC | PRN

## 2012-06-03 MED ORDER — LIDOCAINE 5 % EX PTCH: 1.0000 | MEDICATED_PATCH | CUTANEOUS | Status: DC

## 2012-06-03 MED ORDER — ONDANSETRON 4 MG PO TBDP
4.0000 mg | ORAL_TABLET | Freq: Once | ORAL | Status: AC
  Administered 2012-06-03: 4 mg via ORAL

## 2012-06-03 MED ORDER — ALPRAZOLAM 0.5 MG PO TABS: 0.5000 mg | ORAL_TABLET | ORAL | Status: DC | PRN

## 2012-06-03 MED ORDER — BUTALBITAL-APAP-CAFFEINE 50-325-40 MG PO TABS: 1.0000 | ORAL_TABLET | Freq: Four times a day (QID) | ORAL | Status: AC | PRN

## 2012-06-03 MED ORDER — KETOROLAC TROMETHAMINE 60 MG/2ML IM SOLN
60.0000 mg | Freq: Once | INTRAMUSCULAR | Status: AC
  Administered 2012-06-03: 60 mg via INTRAMUSCULAR
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
## 2012-06-06 ENCOUNTER — Other Ambulatory Visit: Payer: Self-pay | Admitting: Family Medicine

## 2012-06-06 ENCOUNTER — Telehealth: Payer: Self-pay

## 2012-06-06 DIAGNOSIS — K6289 Other specified diseases of anus and rectum: Secondary | ICD-10-CM

## 2012-06-06 MED ORDER — HYDROCORTISONE 2.5 % RE CREA: TOPICAL_CREAM | Freq: Two times a day (BID) | RECTAL | Status: DC

## 2012-06-06 NOTE — Telephone Encounter (Signed)
Pt called wants to discuss a hemorrids issue with dr Milus Glazier, please call pt to advise

## 2012-06-06 NOTE — Telephone Encounter (Signed)
Spoke with pt and she states she was seen here 3 days ago and is taking a lot of medicine such as Robaxin, Tramadol, and Imitrex. She already has internal hemorrhoids and her stool has become very hard, (she thinks it may be because of all the medicine she is taking) and states she is in a lot of pain. She would like to know if she can be RXd something for this. She also requests a small quantity of Tramadol called in to help her through the pain. Please advise. Pt states you can call her if you would like.

## 2012-11-28 ENCOUNTER — Other Ambulatory Visit: Payer: Self-pay | Admitting: Family Medicine

## 2012-11-29 NOTE — Telephone Encounter (Signed)
Dr Conley Rolls, it looks like pt is due for f/up, but you have seen her most recently. I didn't see that you had ibuprofen in her plan, so wasn't sure if you wanted her taking this and quantity? If so, I can give her 1 mos RF w/note to RTC for more RFs.

## 2012-12-02 ENCOUNTER — Ambulatory Visit (INDEPENDENT_AMBULATORY_CARE_PROVIDER_SITE_OTHER): Payer: 59 | Admitting: Internal Medicine

## 2012-12-02 VITALS — BP 98/62 | HR 82 | Temp 98.2°F | Resp 18 | Ht 66.5 in | Wt 160.0 lb

## 2012-12-02 DIAGNOSIS — R5381 Other malaise: Secondary | ICD-10-CM

## 2012-12-02 DIAGNOSIS — R5383 Other fatigue: Secondary | ICD-10-CM

## 2012-12-02 DIAGNOSIS — F419 Anxiety disorder, unspecified: Secondary | ICD-10-CM

## 2012-12-02 DIAGNOSIS — G4700 Insomnia, unspecified: Secondary | ICD-10-CM

## 2012-12-02 DIAGNOSIS — M542 Cervicalgia: Secondary | ICD-10-CM

## 2012-12-02 DIAGNOSIS — E0789 Other specified disorders of thyroid: Secondary | ICD-10-CM

## 2012-12-02 DIAGNOSIS — E079 Disorder of thyroid, unspecified: Secondary | ICD-10-CM

## 2012-12-02 DIAGNOSIS — F17200 Nicotine dependence, unspecified, uncomplicated: Secondary | ICD-10-CM | POA: Insufficient documentation

## 2012-12-02 LAB — POCT CBC
Granulocyte percent: 65.4 %G (ref 37–80)
HCT, POC: 42.1 % (ref 37.7–47.9)
Hemoglobin: 13.2 g/dL (ref 12.2–16.2)
Lymph, poc: 3.2 (ref 0.6–3.4)
MCH, POC: 30.7 pg (ref 27–31.2)
MCHC: 31.4 g/dL — AB (ref 31.8–35.4)
MCV: 97.8 fL — AB (ref 80–97)
MID (cbc): 0.8 (ref 0–0.9)
MPV: 8.6 fL (ref 0–99.8)
POC Granulocyte: 7.4 — AB (ref 2–6.9)
POC LYMPH PERCENT: 27.9 %L (ref 10–50)
POC MID %: 6.7 %M (ref 0–12)
Platelet Count, POC: 287 10*3/uL (ref 142–424)
RBC: 4.3 M/uL (ref 4.04–5.48)
RDW, POC: 12.8 %
WBC: 11.3 10*3/uL — AB (ref 4.6–10.2)

## 2012-12-02 MED ORDER — CYCLOBENZAPRINE HCL 5 MG PO TABS: 5.0000 mg | ORAL_TABLET | Freq: Three times a day (TID) | ORAL | Status: DC | PRN

## 2012-12-02 MED ORDER — IBUPROFEN 800 MG PO TABS: 800.0000 mg | ORAL_TABLET | Freq: Three times a day (TID) | ORAL | Status: DC | PRN

## 2012-12-02 NOTE — Progress Notes (Signed)
Subjective:    Patient ID: Abigail White, female    DOB: Sep 26, 1980, 32 y.o.   MRN: [id number]  HPI complaining of marked fatigue for 4-8 weeks Difficulty sleeping, hard to fall asleep, and wakes frequently has young children get in bed with her tonight/she snores but has no history of gasping for air/she manages to have no daytime hypersomnolence Works as a Engineer, civil (consulting) in the dialysis [place] Married to an addict who is in remission but is a source of stress and maybe about to relapse again 4 kids, young require a lot of attention(2 are stepchildren)(the mother was a crack addict) Mother who is very ill lives with her needing her care Father Abigail White-our Patient Son Abigail White recently here with suspected Abigail White spotted fever  Has had lots of breast tenderness in the last month but has done numerous home pregnancy tests that were negative and also has not missed a period while using the patch  [employer] counseling Mom w/ graves-hypo//fibromyalgia on Cymbalta  PMH-stormy adolescence/ started by me on Paxil at age 72/has never done well on  SSRIs or Wellbutrin/allergic reaction to Zoloft with a rash Her brother is an addict/  Review of Systems  Constitutional: Positive for fatigue. Negative for fever, activity change, appetite change and unexpected weight change.  HENT: Positive for neck pain and neck stiffness. Negative for congestion, rhinorrhea, trouble swallowing and voice change.        History of arthritis.Dr.Le-requires exercises and intermittent muscle relaxers  Eyes: Negative for visual disturbance.  Respiratory: Negative for apnea, cough, shortness of breath and wheezing.   Cardiovascular: Negative for chest pain, palpitations and leg swelling.  Endocrine: Negative for cold intolerance and heat intolerance.  Genitourinary: Negative for dysuria, frequency, hematuria, flank pain, vaginal discharge, difficulty urinating and vaginal pain.       See history of ovarian cyst on  the right fall 2013 with cramping and expulsion of Mirena-Dr. Billy Coast LEEP 3 months ago   Musculoskeletal: Positive for myalgias and back pain. Negative for joint swelling.       History of sciatica  Recently uncomfortable all over in large muscles   Neurological: Positive for headaches. Negative for dizziness, weakness and light-headedness.  Hematological: Negative for adenopathy. Does not bruise/bleed easily.  Psychiatric/Behavioral: Positive for sleep disturbance and dysphoric mood. The patient is nervous/anxious.        She feels overwhelmed most of and has problems with anxiety. She's been unable to tolerate SSRIs or other medications because they seem to have the opposite effect. She is afraid to take things that would make her sleep because she needs to wake up for the kids.   her headaches respond ibuprofen taken on an intermittent basis     Objective:   Physical Exam  Constitutional: She is oriented to person, place, and time. She appears well-developed and well-nourished.  HENT:  Right Ear: External ear normal.  Left Ear: External ear normal.  Nose: Nose normal.  Mouth/Throat: Oropharynx is clear and moist.  Eyes: Conjunctivae and EOM are normal. Pupils are equal, round, and reactive to light.  Neck: Normal range of motion. Thyromegaly present.  Mildly enlarged thyroid that is tender especially on the right but is symmetrical without nodules His tender over the posterior C-spine with some discomfort with range of motion and him going from lying to sitting/shoulder range of motion is good  Cardiovascular: Normal rate, regular rhythm and normal heart sounds.   No murmur heard. Pulmonary/Chest: Breath sounds normal. She has no  wheezes. She exhibits no tenderness.  Abdominal: Soft. Bowel sounds are normal. She exhibits no mass. There is no tenderness. There is no guarding.  No organomegaly  Musculoskeletal: Normal range of motion. She exhibits no edema.  Lymphadenopathy:    She  has no cervical adenopathy.  Neurological: She is alert and oriented to person, place, and time. She has normal reflexes. No cranial nerve deficit.  Skin: No rash noted. No erythema.  Psychiatric: She has a normal mood and affect. Her behavior is normal. Thought content normal.      Results for orders placed in visit on 12/02/12  POCT CBC      Result Value Range   WBC 11.3 (*) 4.6 - 10.2 K/uL   Lymph, poc 3.2  0.6 - 3.4   POC LYMPH PERCENT 27.9  10 - 50 %L   MID (cbc) 0.8  0 - 0.9   POC MID % 6.7  0 - 12 %M   POC Granulocyte 7.4 (*) 2 - 6.9   Granulocyte percent 65.4  37 - 80 %G   RBC 4.30  4.04 - 5.48 M/uL   Hemoglobin 13.2  12.2 - 16.2 g/dL   HCT, POC 96.2  95.2 - 47.9 %   MCV 97.8 (*) 80 - 97 fL   MCH, POC 30.7  27 - 31.2 pg   MCHC 31.4 (*) 31.8 - 35.4 g/dL   RDW, POC 84.1     Platelet Count, POC 287  142 - 424 K/uL   MPV 8.6  0 - 99.8 fL   50 minute office visit     Assessment & Plan:  Other malaise and fatigue - Plan: POCT CBC, Comprehensive metabolic panel, TSH, T4, Free, Thyroid Peroxidase Antibody, Sedimentation rate  Insomnia, unspecified - Plan: POCT CBC, Comprehensive metabolic panel, TSH, T4, Free, Thyroid Peroxidase Antibody, Sedimentation rate  Pain in the neck/? Underlying arthritis - Plan: cyclobenzaprine (FLEXERIL) 5 MG tablet  Headache syndrome-okay for occasional ibuprofen 800/she has Fioricet but uses it sparingly  Thyroid pain- antibodies  Plan- She is working through a codependency workbook Discussed valerian root 300-600 mg at bedtime She has a prescription for Xanax at the pharmacy but has never filled keeping it for an emergency Lots of other options discussed Needs the fundamentals of eating sleeping and exercising correctly to be reestablished

## 2012-12-03 LAB — COMPREHENSIVE METABOLIC PANEL
ALT: 11 U/L (ref 0–35)
AST: 12 U/L (ref 0–37)
Albumin: 3.8 g/dL (ref 3.5–5.2)
Alkaline Phosphatase: 39 U/L (ref 39–117)
BUN: 10 mg/dL (ref 6–23)
CO2: 26 mEq/L (ref 19–32)
Calcium: 9.2 mg/dL (ref 8.4–10.5)
Chloride: 106 mEq/L (ref 96–112)
Creat: 0.77 mg/dL (ref 0.50–1.10)
Glucose, Bld: 84 mg/dL (ref 70–99)
Potassium: 4.7 mEq/L (ref 3.5–5.3)
Sodium: 140 mEq/L (ref 135–145)
Total Bilirubin: 0.2 mg/dL — ABNORMAL LOW (ref 0.3–1.2)
Total Protein: 6.4 g/dL (ref 6.0–8.3)

## 2012-12-03 LAB — TSH: TSH: 1.38 u[IU]/mL (ref 0.350–4.500)

## 2012-12-03 LAB — T4, FREE: Free T4: 1.15 ng/dL (ref 0.80–1.80)

## 2012-12-03 MED ORDER — ALPRAZOLAM 0.5 MG PO TABS: 0.5000 mg | ORAL_TABLET | Freq: Every evening | ORAL | Status: DC | PRN

## 2012-12-03 NOTE — Addendum Note (Signed)
Addended by: Tonye Pearson on: 12/03/2012 05:13 PM   Modules accepted: Orders

## 2012-12-04 LAB — THYROID PEROXIDASE ANTIBODY: Thyroperoxidase Ab SerPl-aCnc: <10 IU/mL (ref <35.0)

## 2012-12-05 LAB — SEDIMENTATION RATE

## 2012-12-06 ENCOUNTER — Encounter: Payer: Self-pay | Admitting: Internal Medicine

## 2012-12-18 ENCOUNTER — Ambulatory Visit (INDEPENDENT_AMBULATORY_CARE_PROVIDER_SITE_OTHER): Payer: 59 | Admitting: Family Medicine

## 2012-12-18 ENCOUNTER — Ambulatory Visit: Payer: 59

## 2012-12-18 VITALS — BP 100/58 | HR 98 | Temp 98.4°F | Resp 16 | Ht 66.0 in | Wt 160.4 lb

## 2012-12-18 DIAGNOSIS — R109 Unspecified abdominal pain: Secondary | ICD-10-CM

## 2012-12-18 DIAGNOSIS — M25559 Pain in unspecified hip: Secondary | ICD-10-CM

## 2012-12-18 LAB — POCT URINALYSIS DIPSTICK
Bilirubin, UA: NEGATIVE
Glucose, UA: NEGATIVE
Ketones, UA: NEGATIVE
Leukocytes, UA: NEGATIVE
Nitrite, UA: NEGATIVE
Spec Grav, UA: 1.025
Urobilinogen, UA: 0.2
pH, UA: 7.5

## 2012-12-18 LAB — POCT UA - MICROSCOPIC ONLY
Casts, Ur, LPF, POC: NEGATIVE
Crystals, Ur, HPF, POC: NEGATIVE
Mucus, UA: NEGATIVE
Yeast, UA: NEGATIVE

## 2012-12-18 LAB — POCT CBC
Granulocyte percent: 69.4 %G (ref 37–80)
HCT, POC: 38.8 % (ref 37.7–47.9)
Hemoglobin: 12.2 g/dL (ref 12.2–16.2)
Lymph, poc: 2.5 (ref 0.6–3.4)
MCH, POC: 30.5 pg (ref 27–31.2)
MCHC: 31.4 g/dL — AB (ref 31.8–35.4)
MCV: 97 fL (ref 80–97)
MID (cbc): 0.9 (ref 0–0.9)
MPV: 90 fL (ref 0–99.8)
POC Granulocyte: 7.7 — AB (ref 2–6.9)
POC LYMPH PERCENT: 22.6 %L (ref 10–50)
POC MID %: 8 %M (ref 0–12)
Platelet Count, POC: 283 10*3/uL (ref 142–424)
RBC: 4 M/uL — AB (ref 4.04–5.48)
RDW, POC: 12.9 %
WBC: 11.1 10*3/uL — AB (ref 4.6–10.2)

## 2012-12-18 LAB — POCT WET PREP WITH KOH
KOH Prep POC: NEGATIVE
RBC Wet Prep HPF POC: NEGATIVE
Trichomonas, UA: NEGATIVE
Yeast Wet Prep HPF POC: NEGATIVE

## 2012-12-18 LAB — POCT URINE PREGNANCY: Preg Test, Ur: NEGATIVE

## 2012-12-18 NOTE — Patient Instructions (Addendum)

## 2012-12-18 NOTE — Progress Notes (Signed)
[facility]:  Office Visit  Chief Complaint:  Chief Complaint  Patient presents with  . Back Pain  . Abdominal Pain    nausea; the stool is  light brown    HPI: Abigail White is a 32 y.o. female who complains of  Sunday started having abd pain after eating, driving from [place]. They had takeout chicken, [employer] in [place].  Pressure in the back , and laso in fron abd. She thought she had to use the restroom but could not so had triedSodium citrate and since then  she had 6-7, Has not had BM since Monday until today.  Had chills, has eaten Chicken and star soup, she is eating bland and crackers and tolerated ok.  This morning she had yogurt made her sick, she had tacos for dinner and sent her to the bathroom.  LMP July 1st   Past Medical History  Diagnosis Date  . Palpitations     H/O  . Dyspnea     H/O  . Chest pain     H/O  . Anxiety     H/O  . Depression   . Allergy   . Migraine    History reviewed. No pertinent past surgical history. History   Social History  . Marital Status: Married    Spouse Name: N/A    Number of Children: N/A  . Years of Education: N/A   Occupational History  . nusre  Other   Social History Main Topics  . Smoking status: Current Every Day Smoker -- 1.00 packs/day    Types: Cigarettes  . Smokeless tobacco: Never Used  . Alcohol Use: 0.6 oz/week    1 Glasses of wine per week     Comment: rarely  . Drug Use: No  . Sexual Activity: Yes    Birth Control/ Protection: Patch   Other Topics Concern  . None   Social History Narrative  . None   Family History  Problem Relation Age of Onset  . Coronary artery disease Mother 64  . Hypertension Mother   . Diabetes Mother   . Heart disease Mother   . Hypertension Other   . Diabetes Other   . Other Father     Shot-violence  . Hypertension Brother   . Hypertension Brother   . Cancer Maternal Grandfather   . Cancer Paternal Grandmother   . Aneurysm  Paternal Grandfather    Allergies  Allergen Reactions  . Codeine   . Latex   . Penicillins   . Sulfa Antibiotics   . Zoloft [Sertraline Hcl]     rash  . Differin [Adapalene] Rash   Prior to Admission medications   Medication Sig Start Date End Date Taking? Authorizing Provider  ALPRAZolam Prudy Feeler) 0.5 MG tablet Take 1 tablet (0.5 mg total) by mouth at bedtime as needed for sleep. 12/03/12  Yes Tonye Pearson, MD  butalbital-acetaminophen-caffeine (FIORICET) [phone] MG per tablet Take 1-2 tablets by mouth every 6 (six) hours as needed for headache. 06/03/12 06/03/13 Yes Ceniyah Thorp P Mozell Haber, DO  cyclobenzaprine (FLEXERIL) 5 MG tablet Take 1 tablet (5 mg total) by mouth 3 (three) times daily as needed for muscle spasms. 12/02/12  Yes Tonye Pearson, MD  hydrocortisone (ANUSOL-HC) 2.5 % rectal cream Place rectally 2 (two) times daily. 06/06/12  Yes Elvina Sidle, MD  ibuprofen (ADVIL,MOTRIN) 800 MG tablet Take 1 tablet (800 mg total) by mouth every 8 (eight) hours as needed. 12/02/12  Yes Molly Maduro  Clemencia Course, MD  lidocaine (LIDODERM) 5 % Place 1 patch onto the skin daily. Remove & Discard patch within 12 hours or as directed by MD 06/03/12  Yes Daeshawn Redmann P Wilbern Pennypacker, DO  ondansetron (ZOFRAN ODT) 4 MG disintegrating tablet Take 1 tablet (4 mg total) by mouth every 8 (eight) hours as needed for nausea. 06/03/12  Yes Aury Scollard P Laurren Lepkowski, DO  ValACYclovir HCl (VALTREX PO) Take by mouth daily.   Yes Historical Provider, MD  esomeprazole (NEXIUM) 40 MG capsule Take 40 mg by mouth as needed. 10/24/11 10/23/12  Phillips Odor, MD     ROS: The patient denies fevers,  night sweats, unintentional weight loss, chest pain, palpitations, wheezing, dyspnea on exertion, dysuria, hematuria, melena, numbness, weakness, or tingling.  All other systems have been reviewed and were otherwise negative with the exception of those mentioned in the HPI and as above.    PHYSICAL EXAM: Filed Vitals:   12/18/12 2010  BP: 100/58  Pulse: 98   Temp: 98.4 F (36.9 C)  Resp: 16   Filed Vitals:   12/18/12 2010  Height: 5\' 6"  (1.676 m)  Weight: 160 lb 6.4 oz (72.757 kg)   Body mass index is 25.9 kg/(m^2).  General: Alert, no acute distress HEENT:  Normocephalic, atraumatic, oropharynx patent.  Cardiovascular:  Regular rate and rhythm, no rubs murmurs or gallops.  No Carotid bruits, radial pulse intact. No pedal edema.  Respiratory: Clear to auscultation bilaterally.  No wheezes, rales, or rhonchi.  No cyanosis, no use of accessory musculature GI: No organomegaly, abdomen is soft and non-tender, positive bowel sounds.  No masses. Skin: No rashes. Neurologic: Facial musculature symmetric. Psychiatric: Patient is appropriate throughout our interaction. Lymphatic: No cervical lymphadenopathy Musculoskeletal: Gait intact.   LABS: Results for orders placed in visit on 12/18/12  POCT CBC      Result Value Range   WBC 11.1 (*) 4.6 - 10.2 K/uL   Lymph, poc 2.5  0.6 - 3.4   POC LYMPH PERCENT 22.6  10 - 50 %L   MID (cbc) 0.9  0 - 0.9   POC MID % 8.0  0 - 12 %M   POC Granulocyte 7.7 (*) 2 - 6.9   Granulocyte percent 69.4  37 - 80 %G   RBC 4.00 (*) 4.04 - 5.48 M/uL   Hemoglobin 12.2  12.2 - 16.2 g/dL   HCT, POC 16.1  09.6 - 47.9 %   MCV 97.0  80 - 97 fL   MCH, POC 30.5  27 - 31.2 pg   MCHC 31.4 (*) 31.8 - 35.4 g/dL   RDW, POC 04.5     Platelet Count, POC 283  142 - 424 K/uL   MPV 90  0 - 99.8 fL  POCT UA - MICROSCOPIC ONLY      Result Value Range   WBC, Ur, HPF, POC 0-3     RBC, urine, microscopic 0-2     Bacteria, U Microscopic trace     Mucus, UA neg     Epithelial cells, urine per micros 1-4     Crystals, Ur, HPF, POC neg     Casts, Ur, LPF, POC neg     Yeast, UA neg    POCT URINALYSIS DIPSTICK      Result Value Range   Color, UA yellow     Clarity, UA clear     Glucose, UA neg     Bilirubin, UA neg     Ketones, UA neg  Spec Grav, UA 1.025     Blood, UA trace     pH, UA 7.5     Protein, UA trace      Urobilinogen, UA 0.2     Nitrite, UA neg     Leukocytes, UA Negative    POCT URINE PREGNANCY      Result Value Range   Preg Test, Ur Negative    POCT WET PREP WITH KOH      Result Value Range   Trichomonas, UA Negative     Clue Cells Wet Prep HPF POC 2-6     Epithelial Wet Prep HPF POC 0-4     Yeast Wet Prep HPF POC neg     Bacteria Wet Prep HPF POC trace     RBC Wet Prep HPF POC neg     WBC Wet Prep HPF POC 0-2     KOH Prep POC Negative       EKG/XRAY:   Primary read interpreted by Dr. Conley Rolls at [facility]. Normal chest No pneumothorax, no free air No obstruction    ASSESSMENT/PLAN: Encounter Diagnoses  Name Primary?  . Abdominal pain, unspecified site Yes  . Right flank pain   . Pain in joint, pelvic region and thigh, unspecified laterality    Most likely constipation vs food poisoning Try miralax Monitor for appendicitis sxs Push fluids, bland diet F/u prn    Rutger Salton PHUONG, DO 12/18/2012 9:47 PM

## 2012-12-19 LAB — COMPREHENSIVE METABOLIC PANEL
ALT: 8 U/L (ref 0–35)
AST: 11 U/L (ref 0–37)
Albumin: 3.6 g/dL (ref 3.5–5.2)
Alkaline Phosphatase: 43 U/L (ref 39–117)
BUN: 10 mg/dL (ref 6–23)
CO2: 21 mEq/L (ref 19–32)
Calcium: 8.6 mg/dL (ref 8.4–10.5)
Chloride: 110 mEq/L (ref 96–112)
Creat: 0.56 mg/dL (ref 0.50–1.10)
Glucose, Bld: 101 mg/dL — ABNORMAL HIGH (ref 70–99)
Potassium: 4 mEq/L (ref 3.5–5.3)
Sodium: 138 mEq/L (ref 135–145)
Total Bilirubin: 0.2 mg/dL — ABNORMAL LOW (ref 0.3–1.2)
Total Protein: 6.3 g/dL (ref 6.0–8.3)

## 2012-12-19 LAB — LIPASE: Lipase: 12 U/L (ref 0–75)

## 2012-12-22 ENCOUNTER — Telehealth: Payer: Self-pay | Admitting: Family Medicine

## 2012-12-22 DIAGNOSIS — R350 Frequency of micturition: Secondary | ICD-10-CM

## 2012-12-22 MED ORDER — CIPROFLOXACIN HCL 250 MG PO TABS: 250.0000 mg | ORAL_TABLET | Freq: Two times a day (BID) | ORAL | Status: DC

## 2012-12-22 NOTE — Telephone Encounter (Signed)
Spoke with pt about sxs, will call in cipro 250 mg x 3 days, she will use her prn BV cream

## 2013-02-01 ENCOUNTER — Ambulatory Visit (INDEPENDENT_AMBULATORY_CARE_PROVIDER_SITE_OTHER): Payer: 59 | Admitting: Internal Medicine

## 2013-02-01 VITALS — BP 90/60 | HR 78 | Temp 98.5°F | Resp 16 | Ht 67.0 in | Wt 160.0 lb

## 2013-02-01 DIAGNOSIS — M542 Cervicalgia: Secondary | ICD-10-CM

## 2013-02-01 DIAGNOSIS — R51 Headache: Secondary | ICD-10-CM

## 2013-02-01 DIAGNOSIS — Z23 Encounter for immunization: Secondary | ICD-10-CM

## 2013-02-01 DIAGNOSIS — K589 Irritable bowel syndrome without diarrhea: Secondary | ICD-10-CM

## 2013-02-01 DIAGNOSIS — K649 Unspecified hemorrhoids: Secondary | ICD-10-CM

## 2013-02-01 DIAGNOSIS — R519 Headache, unspecified: Secondary | ICD-10-CM

## 2013-02-01 DIAGNOSIS — G8929 Other chronic pain: Secondary | ICD-10-CM

## 2013-02-01 DIAGNOSIS — Z8669 Personal history of other diseases of the nervous system and sense organs: Secondary | ICD-10-CM

## 2013-02-01 DIAGNOSIS — Z0000 Encounter for general adult medical examination without abnormal findings: Secondary | ICD-10-CM

## 2013-02-01 MED ORDER — KETOROLAC TROMETHAMINE 60 MG/2ML IM SOLN
60.0000 mg | Freq: Once | INTRAMUSCULAR | Status: AC
  Administered 2013-02-01: 60 mg via INTRAMUSCULAR

## 2013-02-01 MED ORDER — DICYCLOMINE HCL 20 MG PO TABS: ORAL_TABLET | ORAL | Status: DC

## 2013-02-01 MED ORDER — LIDOCAINE-HYDROCORTISONE ACE 3-2.5 % RE KIT: 1.0000 "application " | PACK | Freq: Two times a day (BID) | RECTAL | Status: DC

## 2013-02-01 MED ORDER — CYCLOBENZAPRINE HCL 10 MG PO TABS: 10.0000 mg | ORAL_TABLET | Freq: Three times a day (TID) | ORAL | Status: DC | PRN

## 2013-02-01 MED ORDER — LIDOCAINE 5 % EX PTCH: 1.0000 | MEDICATED_PATCH | CUTANEOUS | Status: DC

## 2013-02-01 MED ORDER — TRAMADOL HCL 50 MG PO TABS: 50.0000 mg | ORAL_TABLET | Freq: Four times a day (QID) | ORAL | Status: DC | PRN

## 2013-02-03 DIAGNOSIS — Z8669 Personal history of other diseases of the nervous system and sense organs: Secondary | ICD-10-CM | POA: Insufficient documentation
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
## 2013-02-06 ENCOUNTER — Ambulatory Visit (INDEPENDENT_AMBULATORY_CARE_PROVIDER_SITE_OTHER): Payer: 59 | Admitting: Physician Assistant

## 2013-02-06 VITALS — BP 115/72 | HR 105 | Temp 98.3°F | Resp 18 | Ht 67.0 in | Wt 161.4 lb

## 2013-02-06 DIAGNOSIS — R05 Cough: Secondary | ICD-10-CM

## 2013-02-06 DIAGNOSIS — IMO0001 Reserved for inherently not codable concepts without codable children: Secondary | ICD-10-CM

## 2013-02-06 DIAGNOSIS — M791 Myalgia, unspecified site: Secondary | ICD-10-CM

## 2013-02-06 DIAGNOSIS — R059 Cough, unspecified: Secondary | ICD-10-CM

## 2013-02-06 DIAGNOSIS — J3489 Other specified disorders of nose and nasal sinuses: Secondary | ICD-10-CM

## 2013-02-06 DIAGNOSIS — J029 Acute pharyngitis, unspecified: Secondary | ICD-10-CM

## 2013-02-06 DIAGNOSIS — R0981 Nasal congestion: Secondary | ICD-10-CM

## 2013-02-06 LAB — POCT CBC
Granulocyte percent: 77.9 %G (ref 37–80)
HCT, POC: 40.6 % (ref 37.7–47.9)
Hemoglobin: 12.9 g/dL (ref 12.2–16.2)
Lymph, poc: 2.3 (ref 0.6–3.4)
MCH, POC: 31 pg (ref 27–31.2)
MCHC: 31.8 g/dL (ref 31.8–35.4)
MCV: 97.7 fL — AB (ref 80–97)
MID (cbc): 0.8 (ref 0–0.9)
MPV: 9 fL (ref 0–99.8)
POC Granulocyte: 10.9 — AB (ref 2–6.9)
POC LYMPH PERCENT: 16.7 %L (ref 10–50)
POC MID %: 5.4 %M (ref 0–12)
Platelet Count, POC: 283 10*3/uL (ref 142–424)
RBC: 4.16 M/uL (ref 4.04–5.48)
RDW, POC: 13 %
WBC: 14 10*3/uL — AB (ref 4.6–10.2)

## 2013-02-06 LAB — POCT INFLUENZA A/B
Influenza A, POC: NEGATIVE
Influenza B, POC: NEGATIVE

## 2013-02-06 LAB — POCT RAPID STREP A (OFFICE): Rapid Strep A Screen: NEGATIVE

## 2013-02-06 MED ORDER — BENZONATATE 100 MG PO CAPS: 100.0000 mg | ORAL_CAPSULE | Freq: Three times a day (TID) | ORAL | Status: DC | PRN

## 2013-02-06 MED ORDER — IPRATROPIUM BROMIDE 0.03 % NA SOLN: 2.0000 | Freq: Two times a day (BID) | NASAL | Status: DC

## 2013-02-06 MED ORDER — DOXYCYCLINE HYCLATE 100 MG PO CAPS: 100.0000 mg | ORAL_CAPSULE | Freq: Two times a day (BID) | ORAL | Status: DC

## 2013-02-06 NOTE — Progress Notes (Signed)
Subjective:    Patient ID: Abigail White, female    DOB: [birth year], 32 y.o.   MRN: [id number]  HPI   Abigail White is a pleasant 32 yr old female here with concern for illness.  States "I hurt all over."  Having "moderate to severe body aches."  Stuffy nose, sore throat, "croupy" cough, coughing up "horrible green, bright yellow crap."  Feels SOB and wheezy.  Cough is keeping her awake at night.  Hurts to cough she has coughed so much.  Experiencing night sweats.  Subjective fever, but none documented - though she has been taking advil, tylenol around the clock, so she thinks may be masking fever.  Symptoms all started 3 days ago.  Coworker was recently diagnosed clinically with the flu.  No other sick contacts.  No GI or urinary symptoms.  Has used Mucinex DM and some prescription cough medicine with little relief.  Recently finished a course of levaquin for bronchitis.  Flu shot recently.    Sleeping poorly.  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  Will not take time off.  Review of Systems  Constitutional: Positive for fever (subjective), diaphoresis (night sweats) and fatigue.  HENT: Positive for congestion, sore throat and rhinorrhea. Negative for ear pain.   Respiratory: Positive for cough, shortness of breath and wheezing.   Cardiovascular: Negative.   Gastrointestinal: Negative.   Musculoskeletal: Positive for myalgias and arthralgias.  Skin: Negative.   Neurological: Negative.        Objective:   Physical Exam  Vitals reviewed. Constitutional: She is oriented to person, place, and time. She appears well-developed and well-nourished. No distress.  HENT:  Head: Normocephalic and atraumatic.  Right Ear: Tympanic membrane and ear canal normal.  Left Ear: Tympanic membrane and ear canal normal.  Nose: Mucosal edema present.  Mouth/Throat: Uvula is midline and mucous membranes are normal. Posterior oropharyngeal erythema present. No oropharyngeal exudate, posterior oropharyngeal  edema or tonsillar abscesses.  Eyes: Conjunctivae are normal. No scleral icterus.  Neck: Neck supple.  Cardiovascular: Normal rate, regular rhythm and normal heart sounds.   Pulmonary/Chest: Effort normal and breath sounds normal. She has no wheezes. She has no rales.  Abdominal: There is no tenderness.  Lymphadenopathy:    She has cervical adenopathy.  Neurological: She is alert and oriented to person, place, and time.  Skin: Skin is warm and dry.  Psychiatric: She has a normal mood and affect. Her behavior is normal.    Results for orders placed in visit on 02/06/13  POCT CBC      Result Value Range   WBC 14.0 (*) 4.6 - 10.2 K/uL   Lymph, poc 2.3  0.6 - 3.4   POC LYMPH PERCENT 16.7  10 - 50 %L   MID (cbc) 0.8  0 - 0.9   POC MID % 5.4  0 - 12 %M   POC Granulocyte 10.9 (*) 2 - 6.9   Granulocyte percent 77.9  37 - 80 %G   RBC 4.16  4.04 - 5.48 M/uL   Hemoglobin 12.9  12.2 - 16.2 g/dL   HCT, POC 08.6  57.8 - 47.9 %   MCV 97.7 (*) 80 - 97 fL   MCH, POC 31.0  27 - 31.2 pg   MCHC 31.8  31.8 - 35.4 g/dL   RDW, POC 46.9     Platelet Count, POC 283  142 - 424 K/uL   MPV 9.0  0 - 99.8 fL  POCT INFLUENZA A/B  Result Value Range   Influenza A, POC Negative     Influenza B, POC Negative    POCT RAPID STREP A (OFFICE)      Result Value Range   Rapid Strep A Screen Negative  Negative        Assessment & Plan:  Cough - Plan: POCT CBC, benzonatate (TESSALON) 100 MG capsule, doxycycline (VIBRAMYCIN) 100 MG capsule  Sore throat - Plan: POCT CBC, POCT Influenza A/B, POCT rapid strep A  Myalgia - Plan: POCT CBC, POCT Influenza A/B, POCT rapid strep A  Nasal congestion - Plan: ipratropium (ATROVENT) 0.03 % nasal spray   Abigail White is a 32 yr old female here with upper respiratory infection.  Afebrile,VSS, lungs CTA.  White count is elevated at 14 today, but all of pt's previous CBCs show leukocytosis as well.  Flu test neg.  Strep test neg.  Possibly viral URI.  Will continue  treating symptoms with mucniex, tussionex, atrovent.  Push fluids.  Encouraged pt stay home from work and rest, but she states she cannot do this.  Offered a work note, but she declines.  Will monitor symptoms over the next 3-4 days.  I have sent doxycycline to the pharmacy to start if worsening or not improving to cover sinusitis/bronchitis.  Pt to call if concerns.

## 2013-02-06 NOTE — Patient Instructions (Signed)
Continue using Mucinex twice daily to help break up congestion.  Tessalon Perles (benzonatate) every 8 hours as needed for cough.  Continue Tussionex syrup.  Atrovent nasal spray 2-3 times daily to help with nasal congestion  An allergy medicine (Allegra/Claritin/Zyrtec) will also help your symptoms.  Drink plenty of fluids (water is best!) and rest!  Start the doxycycline if you are worsening or not improving in about 3 days

## 2013-05-22 ENCOUNTER — Ambulatory Visit: Payer: 59

## 2013-05-22 ENCOUNTER — Ambulatory Visit (INDEPENDENT_AMBULATORY_CARE_PROVIDER_SITE_OTHER): Payer: 59 | Admitting: Family Medicine

## 2013-05-22 VITALS — BP 102/68 | HR 90 | Temp 98.1°F | Resp 18 | Ht 67.0 in | Wt 164.0 lb

## 2013-05-22 DIAGNOSIS — J209 Acute bronchitis, unspecified: Secondary | ICD-10-CM

## 2013-05-22 DIAGNOSIS — R059 Cough, unspecified: Secondary | ICD-10-CM

## 2013-05-22 DIAGNOSIS — R6889 Other general symptoms and signs: Secondary | ICD-10-CM

## 2013-05-22 DIAGNOSIS — R062 Wheezing: Secondary | ICD-10-CM

## 2013-05-22 DIAGNOSIS — R05 Cough: Secondary | ICD-10-CM

## 2013-05-22 DIAGNOSIS — R0600 Dyspnea, unspecified: Secondary | ICD-10-CM

## 2013-05-22 DIAGNOSIS — J329 Chronic sinusitis, unspecified: Secondary | ICD-10-CM

## 2013-05-22 LAB — POCT CBC
Granulocyte percent: 76.7 %G (ref 37–80)
HCT, POC: 43.1 % (ref 37.7–47.9)
Hemoglobin: 13.2 g/dL (ref 12.2–16.2)
Lymph, poc: 2.2 (ref 0.6–3.4)
MCH, POC: 30.1 pg (ref 27–31.2)
MCHC: 30.6 g/dL — AB (ref 31.8–35.4)
MCV: 98.2 fL — AB (ref 80–97)
MID (cbc): 0.7 (ref 0–0.9)
MPV: 8.7 fL (ref 0–99.8)
POC Granulocyte: 9.7 — AB (ref 2–6.9)
POC LYMPH PERCENT: 17.4 %L (ref 10–50)
POC MID %: 5.9 %M (ref 0–12)
Platelet Count, POC: 275 10*3/uL (ref 142–424)
RBC: 4.39 M/uL (ref 4.04–5.48)
RDW, POC: 12.9 %
WBC: 12.6 10*3/uL — AB (ref 4.6–10.2)

## 2013-05-22 LAB — POCT INFLUENZA A/B
Influenza A, POC: NEGATIVE
Influenza B, POC: NEGATIVE

## 2013-05-22 MED ORDER — CLARITHROMYCIN ER 500 MG PO TB24: 500.0000 mg | ORAL_TABLET | Freq: Every day | ORAL | Status: DC

## 2013-05-22 MED ORDER — ALBUTEROL SULFATE HFA 108 (90 BASE) MCG/ACT IN AERS
2.0000 | INHALATION_SPRAY | RESPIRATORY_TRACT | Status: DC | PRN
Start: 2013-05-22 — End: 2014-07-14

## 2013-05-22 NOTE — Patient Instructions (Signed)
Drink plenty of fluids to continue keep yourself well hydrated  Try to get plenty of rest  Take clarithromycin one twice daily for antibiotic  Use the cough syrup that you have if needed for coughing  Use the albuterol inhaler 2 puffs every 4-6 hours as needed for tightness in chest and wheezing and coughing  Use the nasal spray your home if necessary.  Return if worse fevers or getting more difficulty breathing, or go to the emergency room if extremely short of breath.

## 2013-05-22 NOTE — Progress Notes (Signed)
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 The patient has been sick for about 6 days. She had some chills but never took her temperature. She is kept on working. She's had several staff out with the flu. She got sick with a respiratory tract infection, rhinorrhea, cough. She's got more short of breath tight feeling in her chest. She has a history of having had pneumonia in the past. She feels lousy. She says she preop and blowing out a lot of yellow phlegm.  Objective: Ill-appearing lady. TMs normal. Throat not erythematous. Neck supple with small anterior nodes. Chest clear to auscultation delayed expiratory phase. Heart regular without murmurs. Flu swab taken. She complains of her throat hurting quite a lot but really doesn't look that bad.   Assessment: Flulike illness Sore throat Cough Dyspnea  Plan: Chest x-ray and flu swab and CBC  Results for orders placed in visit on 05/22/13  POCT CBC      Result Value Range   WBC 12.6 (*) 4.6 - 10.2 K/uL   Lymph, poc 2.2  0.6 - 3.4   POC LYMPH PERCENT 17.4  10 - 50 %L   MID (cbc) 0.7  0 - 0.9   POC MID % 5.9  0 - 12 %M   POC Granulocyte 9.7 (*) 2 - 6.9   Granulocyte percent 76.7  37 - 80 %G   RBC 4.39  4.04 - 5.48 M/uL   Hemoglobin 13.2  12.2 - 16.2 g/dL   HCT, POC 45.443.1  09.837.7 - 47.9 %   MCV 98.2 (*) 80 - 97 fL   MCH, POC 30.1  27 - 31.2 pg   MCHC 30.6 (*) 31.8 - 35.4 g/dL   RDW, POC 11.912.9     Platelet Count, POC 275  142 - 424 K/uL   MPV 8.7  0 - 99.8 fL  POCT INFLUENZA A/B      Result Value Range   Influenza A, POC Negative     Influenza B, POC Negative     [facility] reading (PRIMARY) by  Dr. Alwyn RenHopper No infiltrate  .

## 2013-11-26 ENCOUNTER — Ambulatory Visit (INDEPENDENT_AMBULATORY_CARE_PROVIDER_SITE_OTHER): Payer: 59 | Admitting: Family Medicine

## 2013-11-26 VITALS — BP 100/60 | HR 98 | Temp 98.4°F | Resp 16 | Ht 67.0 in | Wt 139.8 lb

## 2013-11-26 DIAGNOSIS — R5381 Other malaise: Secondary | ICD-10-CM

## 2013-11-26 DIAGNOSIS — F4310 Post-traumatic stress disorder, unspecified: Secondary | ICD-10-CM

## 2013-11-26 DIAGNOSIS — R5383 Other fatigue: Secondary | ICD-10-CM

## 2013-11-26 DIAGNOSIS — R5382 Chronic fatigue, unspecified: Secondary | ICD-10-CM

## 2013-11-26 LAB — POCT CBC
Granulocyte percent: 71 %G (ref 37–80)
HCT, POC: 44.8 % (ref 37.7–47.9)
Hemoglobin: 14.4 g/dL (ref 12.2–16.2)
Lymph, poc: 2.4 (ref 0.6–3.4)
MCH, POC: 30.4 pg (ref 27–31.2)
MCHC: 32.1 g/dL (ref 31.8–35.4)
MCV: 94.7 fL (ref 80–97)
MID (cbc): 0.4 (ref 0–0.9)
MPV: 7.8 fL (ref 0–99.8)
POC Granulocyte: 6.8 (ref 2–6.9)
POC LYMPH PERCENT: 24.7 %L (ref 10–50)
POC MID %: 4.3 %M (ref 0–12)
Platelet Count, POC: 268 10*3/uL (ref 142–424)
RBC: 4.73 M/uL (ref 4.04–5.48)
RDW, POC: 12.3 %
WBC: 9.6 10*3/uL (ref 4.6–10.2)

## 2013-11-26 LAB — POCT UA - MICROSCOPIC ONLY
Bacteria, U Microscopic: NEGATIVE
Casts, Ur, LPF, POC: NEGATIVE
Crystals, Ur, HPF, POC: NEGATIVE
Mucus, UA: NEGATIVE
RBC, urine, microscopic: NEGATIVE
WBC, Ur, HPF, POC: NEGATIVE
Yeast, UA: NEGATIVE

## 2013-11-26 LAB — COMPREHENSIVE METABOLIC PANEL
ALT: 15 U/L (ref 0–35)
AST: 14 U/L (ref 0–37)
Albumin: 4.4 g/dL (ref 3.5–5.2)
Alkaline Phosphatase: 55 U/L (ref 39–117)
BUN: 8 mg/dL (ref 6–23)
CO2: 26 mEq/L (ref 19–32)
Calcium: 9.5 mg/dL (ref 8.4–10.5)
Chloride: 105 mEq/L (ref 96–112)
Creat: 0.67 mg/dL (ref 0.50–1.10)
Glucose, Bld: 64 mg/dL — ABNORMAL LOW (ref 70–99)
Potassium: 4.6 mEq/L (ref 3.5–5.3)
Sodium: 140 mEq/L (ref 135–145)
Total Bilirubin: 0.4 mg/dL (ref 0.2–1.2)
Total Protein: 7 g/dL (ref 6.0–8.3)

## 2013-11-26 LAB — POCT SEDIMENTATION RATE: POCT SED RATE: 5 mm/hr (ref 0–22)

## 2013-11-26 LAB — POCT URINALYSIS DIPSTICK
Bilirubin, UA: NEGATIVE
Blood, UA: NEGATIVE
Glucose, UA: NEGATIVE
Ketones, UA: NEGATIVE
Leukocytes, UA: NEGATIVE
Nitrite, UA: NEGATIVE
Protein, UA: NEGATIVE
Spec Grav, UA: <=1.005
Urobilinogen, UA: 0.2
pH, UA: 6
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
## 2013-11-26 NOTE — Patient Instructions (Signed)
Stress Stress-related medical problems are becoming increasingly common. The body has a built-in physical response to stressful situations. Faced with pressure, challenge or danger, we need to react quickly. Our bodies release hormones such as cortisol and adrenaline to help do this. These hormones are part of the "fight or flight" response and affect the metabolic rate, heart rate and blood pressure, resulting in a heightened, stressed state that prepares the body for optimum performance in dealing with a stressful situation. It is likely that early man required these mechanisms to stay alive, but usually modern stresses do not call for this, and the same hormones released in today's world can damage health and reduce coping ability. CAUSES  Pressure to perform at work, at school or in sports.  Threats of physical violence.  Money worries.  Arguments.  Family conflicts.  Divorce or separation from significant other.  Bereavement.  New job or unemployment.  Changes in location.  Alcohol or drug abuse. SOMETIMES, THERE IS NO PARTICULAR REASON FOR DEVELOPING STRESS. Almost all people are at risk of being stressed at some time in their lives. It is important to know that some stress is temporary and some is long term.  Temporary stress will go away when a situation is resolved. Most people can cope with short periods of stress, and it can often be relieved by relaxing, taking a walk or getting any type of exercise, chatting through issues with friends, or having a good night's sleep.  Chronic (long-term, continuous) stress is much harder to deal with. It can be psychologically and emotionally damaging. It can be harmful both for an individual and for friends and family. SYMPTOMS Everyone reacts to stress differently. There are some common effects that help us recognize it. In times of extreme stress, people may:  Shake uncontrollably.  Breathe faster and deeper than normal  (hyperventilate).  Vomit.  For people with asthma, stress can trigger an attack.  For some people, stress may trigger migraine headaches, ulcers, and body pain. PHYSICAL EFFECTS OF STRESS MAY INCLUDE:  Loss of energy.  Skin problems.  Aches and pains resulting from tense muscles, including neck ache, backache and tension headaches.  Increased pain from arthritis and other conditions.  Irregular heart beat (palpitations).  Periods of irritability or anger.  Apathy or depression.  Anxiety (feeling uptight or worrying).  Unusual behavior.  Loss of appetite.  Comfort eating.  Lack of concentration.  Loss of, or decreased, sex-drive.  Increased smoking, drinking, or recreational drug use.  For women, missed periods.  Ulcers, joint pain, and muscle pain. Post-traumatic stress is the stress caused by any serious accident, strong emotional damage, or extremely difficult or violent experience such as rape or war. Post-traumatic stress victims can experience mixtures of emotions such as fear, shame, depression, guilt or anger. It may include recurrent memories or images that may be haunting. These feelings can last for weeks, months or even years after the traumatic event that triggered them. Specialized treatment, possibly with medicines and psychological therapies, is available. If stress is causing physical symptoms, severe distress or making it difficult for you to function as normal, it is worth seeing your caregiver. It is important to remember that although stress is a usual part of life, extreme or prolonged stress can lead to other illnesses that will need treatment. It is better to visit a doctor sooner rather than later. Stress has been linked to the development of high blood pressure and heart disease, as well as insomnia and depression.   There is no diagnostic test for stress since everyone reacts to it differently. But a caregiver will be able to spot the physical  symptoms, such as:  Headaches.  Shingles.  Ulcers. Emotional distress such as intense worry, low mood or irritability should be detected when the doctor asks pertinent questions to identify any underlying problems that might be the cause. In case there are physical reasons for the symptoms, the doctor may also want to do some tests to exclude certain conditions. If you feel that you are suffering from stress, try to identify the aspects of your life that are causing it. Sometimes you may not be able to change or avoid them, but even a small change can have a positive ripple effect. A simple lifestyle change can make all the difference. STRATEGIES THAT CAN HELP DEAL WITH STRESS:  Delegating or sharing responsibilities.  Avoiding confrontations.  Learning to be more assertive.  Regular exercise.  Avoid using alcohol or street drugs to cope.  Eating a healthy, balanced diet, rich in fruit and vegetables and proteins.  Finding humor or absurdity in stressful situations.  Never taking on more than you know you can handle comfortably.  Organizing your time better to get as much done as possible.  Talking to friends or family and sharing your thoughts and fears.  Listening to music or relaxation tapes.  Relaxation techniques like deep breathing, meditation, and yoga.  Tensing and then relaxing your muscles, starting at the toes and working up to the head and neck. If you think that you would benefit from help, either in identifying the things that are causing your stress or in learning techniques to help you relax, see a caregiver who is capable of helping you with this. Rather than relying on medications, it is usually better to try and identify the things in your life that are causing stress and try to deal with them. There are many techniques of managing stress including counseling, psychotherapy, aromatherapy, yoga, and exercise. Your caregiver can help you determine what is best  for you. Document Released: 07/15/2002 Document Revised: 04/29/2013 Document Reviewed: 06/11/2007 [employer] Patient Information 2015 [employer]. This information is not intended to replace advice given to you by your health care provider. Make sure you discuss any questions you have with your health care provider.  

## 2013-11-27 LAB — T4, FREE: Free T4: 1.47 ng/dL (ref 0.80–1.80)

## 2013-11-27 LAB — VITAMIN D 25 HYDROXY (VIT D DEFICIENCY, FRACTURES): Vit D, 25-Hydroxy: 35 ng/mL (ref 30–89)

## 2013-11-27 LAB — FERRITIN: Ferritin: 30 ng/mL (ref 10–291)

## 2013-11-27 LAB — TSH: TSH: 0.436 u[IU]/mL (ref 0.350–4.500)

## 2013-11-27 LAB — VITAMIN B12: Vitamin B-12: 586 pg/mL (ref 211–911)

## 2013-11-28 ENCOUNTER — Encounter: Payer: Self-pay | Admitting: Family Medicine

## 2013-12-01 ENCOUNTER — Telehealth: Payer: Self-pay | Admitting: *Deleted

## 2013-12-01 NOTE — Telephone Encounter (Signed)
Dr Milus GlazierLauenstein-- PT IS WANTING YOU TO CALL HER REGARDING HER MEDICATIONS

## 2013-12-02 MED ORDER — CIPROFLOXACIN HCL 250 MG PO TABS: 250.0000 mg | ORAL_TABLET | Freq: Two times a day (BID) | ORAL | Status: DC

## 2013-12-02 MED ORDER — VALACYCLOVIR HCL 500 MG PO TABS: 500.0000 mg | ORAL_TABLET | Freq: Every day | ORAL | Status: DC

## 2013-12-02 NOTE — Telephone Encounter (Signed)
The fatigue is not explained by the labs.  Patient is in therapy and is expecting mediation this Friday to end some of her stress with husband. She has some UTI symptoms and needs valtrex refilled. Anxiety is not a major problem, and she doesn't want to take the xanax any more than absolutely necessary so she doesn't get "addicted to it."

## 2014-01-05 ENCOUNTER — Other Ambulatory Visit: Payer: Self-pay | Admitting: Internal Medicine

## 2014-01-07 NOTE — Telephone Encounter (Signed)
Dr L, it looks like you are pt's PCP and you saw her recently for stress. Do you want to RF this? (Pt hasn't seen Dr Merla Riches in almost a years.

## 2014-01-08 ENCOUNTER — Other Ambulatory Visit: Payer: Self-pay | Admitting: Radiology

## 2014-01-08 NOTE — Telephone Encounter (Signed)
Faxed Alprazolam

## 2014-03-18 ENCOUNTER — Ambulatory Visit (INDEPENDENT_AMBULATORY_CARE_PROVIDER_SITE_OTHER): Payer: 59 | Admitting: Family Medicine

## 2014-03-18 VITALS — BP 112/68 | HR 86 | Temp 98.5°F | Resp 16 | Ht 66.25 in | Wt 151.0 lb

## 2014-03-18 DIAGNOSIS — F411 Generalized anxiety disorder: Secondary | ICD-10-CM

## 2014-03-18 DIAGNOSIS — Z79899 Other long term (current) drug therapy: Secondary | ICD-10-CM

## 2014-03-18 DIAGNOSIS — F909 Attention-deficit hyperactivity disorder, unspecified type: Secondary | ICD-10-CM

## 2014-03-18 DIAGNOSIS — M542 Cervicalgia: Secondary | ICD-10-CM

## 2014-03-18 DIAGNOSIS — G44209 Tension-type headache, unspecified, not intractable: Secondary | ICD-10-CM

## 2014-03-18 MED ORDER — IBUPROFEN 800 MG PO TABS: 800.0000 mg | ORAL_TABLET | Freq: Three times a day (TID) | ORAL | Status: DC | PRN

## 2014-03-18 MED ORDER — LIDOCAINE 5 % EX PTCH: 1.0000 | MEDICATED_PATCH | CUTANEOUS | Status: DC

## 2014-03-18 MED ORDER — LIDOCAINE 5 % EX PTCH
1.0000 | MEDICATED_PATCH | CUTANEOUS | Status: DC
Start: 2014-03-18 — End: 2014-03-27

## 2014-03-18 NOTE — Progress Notes (Addendum)
Subjective:   This chart was scribed for Abigail Agreste, MD at [facility] by Rayfield Citizen, medical scribe. This patient was seen in room 1 and the patient's care was started at 3:22 PM.    Patient ID: Abigail White, female    DOB: 1980-11-25, 33 y.o.   MRN: [id number]  Chief Complaint  Patient presents with  . EKG need - has to go back on Adderall  . Medication Refill    Ibuprofen 800 mg; Lidoderm patch   Patient Active Problem List   Diagnosis Date Noted  . History of migraine headaches 02/03/2013  . Nicotine addiction 12/02/2012  . Palpitations   . Dyspnea   . Chest pain   . Anxiety    Past Medical History  Diagnosis Date  . Palpitations     H/O  . Dyspnea     H/O  . Chest pain     H/O  . Anxiety     H/O  . Depression   . Allergy   . Migraine    No past surgical history on file. Allergies  Allergen Reactions  . Codeine   . Effexor [Venlafaxine]     Multiple somatic issues  . Latex   . Lexapro [Escitalopram Oxalate]     Mania   . Paxil [Paroxetine Hcl]     Feels detached   . Penicillins   . Sulfa Antibiotics   . Wellbutrin [Bupropion]     Became belligerant  . Zoloft [Sertraline Hcl]     rash  . Differin [Adapalene] Rash   Prior to Admission medications   Medication Sig Start Date End Date Taking? Authorizing Provider  albuterol (PROVENTIL HFA;VENTOLIN HFA) 108 (90 BASE) MCG/ACT inhaler Inhale 2 puffs into the lungs every 4 (four) hours as needed for wheezing or shortness of breath (cough, shortness of breath or wheezing.). 05/22/13  Yes Posey Boyer, MD  ALPRAZolam Duanne Moron) 0.5 MG tablet TAKE 1 TABLET BY MOUTH AT BEDTIME AS NEEDED FOR SLEEP 01/08/14  Yes Robyn Haber, MD  cyclobenzaprine (FLEXERIL) 10 MG tablet Take 1 tablet (10 mg total) by mouth 3 (three) times daily as needed for muscle spasms. 02/01/13  Yes Leandrew Koyanagi, MD  ibuprofen (ADVIL,MOTRIN) 800 MG tablet Take 1 tablet (800 mg total) by mouth every 8 (eight)  hours as needed. 12/02/12  Yes Leandrew Koyanagi, MD  L-Methylfolate (DEPLIN) 15 MG TABS Take by mouth.   Yes Historical Provider, MD  lidocaine (LIDODERM) 5 % Place 1 patch onto the skin daily. Remove & Discard patch within 12 hours or as directed by MD 02/01/13  Yes Leandrew Koyanagi, MD  Lidocaine-Hydrocortisone Ace (ANAMANTLE HC) 3-2.5 % KIT Place 1 application rectally 2 (two) times daily. 02/01/13  Yes Leandrew Koyanagi, MD  valACYclovir (VALTREX) 500 MG tablet Take 1 tablet (500 mg total) by mouth daily. 1 qd 12/02/13  Yes Robyn Haber, MD   History   Social History  . Marital Status: Married    Spouse Name: N/A    Number of Children: N/A  . Years of Education: N/A   Occupational History  . nusre  Other   Social History Main Topics  . Smoking status: Current Every Day Smoker -- 1.00 packs/day    Types: Cigarettes  . Smokeless tobacco: Never Used  . Alcohol Use: 0.6 oz/week    1 Glasses of wine per week     Comment: rarely  . Drug Use: No  . Sexual Activity:  Yes    Birth Control/ Protection: Patch   Other Topics Concern  . Not on file   Social History Narrative   HPI  Abigail White is a 33 y.o. female  PCP: Robyn Haber, MD   Patient has been seen by Dr. Joseph Art in the past, most recently July 2015. She has a history of PTSD and chronic fatigue; also possible depression at that time. Noted at that visit either allergies or intolerances to multiple SSRIs. Was followed by a counselor at that time. Was also seen by Dr. Laney Pastor in September 2014. At that time was treated with a Lidoderm patch for neck pain as well as Toradol injection for headache, ultram prescription and flexeril.   HPI Comments: Abigail White is a 33 y.o. female who presents to the [facility] for an EKG with interpretation for "cardiac clearance" to start taking Adderall again. She is followed by a psychiatric NP (Robyn) at [facility], where she is prescribed  Deplin for depression. Her NP would like this study before prescribing Adderall for her. She reports that she is very stressed right now; she works as an Therapist, sports, a [employer]ar[employer] manager. She is also struggling with some personal issues regarding ex-husband and children, as well as the death of her aunt. She states that her ADHD and stress are not mixing well.   She denies any personal history of heart issues. She does report a family history of heart issues; her mother, who is obese, was diagnosed with CHF in her mid to late 67s, but has never had a heart attack.   She would like a refill of other medications:  -- Ibuprofen for headaches; she has headaches about 2-3 times per week and these are normal for her. She no longer has a headache specialist, but previously saw Dr. Melton Alar. She has not seen him in several years.   -- Lidoderm patches for neck pain; she reports that her headaches begin with tension in her neck. She utilizes maybe 1x per month. She denies side effects with these patches.   Review of Systems  Musculoskeletal: Positive for neck pain.  Neurological: Positive for headaches.  Psychiatric/Behavioral: The patient is nervous/anxious.   All other systems reviewed and are negative.     Objective:   Physical Exam  Constitutional: She is oriented to person, place, and time. She appears well-developed and well-nourished.  HENT:  Head: Normocephalic and atraumatic.  Eyes: Conjunctivae and EOM are normal. Pupils are equal, round, and reactive to light.  Neck: Carotid bruit is not present. No tracheal deviation present.  Cardiovascular: Normal rate, regular rhythm, normal heart sounds and intact distal pulses.   Pulmonary/Chest: Effort normal and breath sounds normal.  Abdominal: Soft. She exhibits no pulsatile midline mass. There is no tenderness.  Musculoskeletal: She exhibits no edema.  Spasm in her c-spine paraspinal muscles but full ROM otherwise  Neurological: She is alert and oriented  to person, place, and time.  Skin: Skin is warm and dry.  Psychiatric: She has a normal mood and affect. Her behavior is normal.  Anxious, tearful at times during exam but interacts appropriately   Nursing note and vitals reviewed.  Filed Vitals:   03/18/14 1414  BP: 112/68  Pulse: 86  Temp: 98.5 F (36.9 C)  TempSrc: Oral  Resp: 16  Height: 5' 6.25" (1.683 m)  Weight: 151 lb (68.493 kg)  SpO2: 98%   EKG: sinus rhythm, RSR' in V1, no acte or concerning findings.  Assessment & Plan:   LATRICE STORLIE is a 33 y.o. female Attention deficit hyperactivity disorder (ADHD), unspecified ADHD type , Generalized anxiety disorder  - EKG without acute or concerning findings. Will follow up with her psychiatric provider as planned, but may be discussing this also with her prior providers here - Dr. Joseph Art or Dr. Laney Pastor. Suspect underlying anxiety and situational stress effects with multiple stressors currently as well as likely underlying ADD.   Discussed coping techniques, relaxation techniques and effects of anxiety on focus, critical thinking, and effectiveness at work.  Encouraged to incorporate these techniques during her work day.   High risk medication use - Plan: EKG as above.   Pain in the neck - Plan: ibuprofen (ADVIL,MOTRIN) 800 MG tablet, lidocaine (LIDODERM) 5 %, DISCONTINUED: lidocaine (LIDODERM) 5 %, DISCONTINUED: ibuprofen (ADVIL,MOTRIN) 800 MG tablet, Tension headache - Plan: ibuprofen (ADVIL,MOTRIN) 800 MG tablet, lidocaine (LIDODERM) 5 %, DISCONTINUED: lidocaine (LIDODERM) 5 %, DISCONTINUED: ibuprofen (ADVIL,MOTRIN) 800 MG tablet  -ok to continue intermittent NSAID, but discussed risk of analgesic rebound HA. Refilled Ibuprofen 800mg  for now, and lidoderm patches for intermittent use. Follow with PCP for further discussion of headaches as declined follow up with prior HA specialist.      Meds ordered this encounter  Medications  . L-Methylfolate (DEPLIN) 15 MG TABS     Sig: Take by mouth.  . DISCONTD: lidocaine (LIDODERM) 5 %    Sig: Place 1 patch onto the skin daily. Remove & Discard patch within 12 hours or as directed by MD    Dispense:  30 patch    Refill:  2  . DISCONTD: ibuprofen (ADVIL,MOTRIN) 800 MG tablet    Sig: Take 1 tablet (800 mg total) by mouth every 8 (eight) hours as needed.    Dispense:  90 tablet    Refill:  11  . ibuprofen (ADVIL,MOTRIN) 800 MG tablet    Sig: Take 1 tablet (800 mg total) by mouth every 8 (eight) hours as needed.    Dispense:  30 tablet    Refill:  0  . lidocaine (LIDODERM) 5 %    Sig: Place 1 patch onto the skin daily. Remove & Discard patch within 12 hours or as directed by MD    Dispense:  30 patch    Refill:  0   Patient Instructions  Relaxation and coping techniques as we discussed - even if for only a few minutes a few times per day with goal of "calm state of mind", that may help critical thinking and focus. Keep follow up with psychiatric provider and counselor, and follow up with Dr. Laney Pastor or Dr. Joseph Art in next few months, but refilled your ibuprofen and lidoderm patches today to get you through next few months. Return to the clinic or go to the nearest emergency room if any of your symptoms worsen or new symptoms occur.  Stress and Stress Management Stress is a normal reaction to life events. It is what you feel when life demands more than you are used to or more than you can handle. Some stress can be useful. For example, the stress reaction can help you catch the last bus of the day, study for a test, or meet a deadline at work. But stress that occurs too often or for too long can cause problems. It can affect your emotional health and interfere with relationships and normal daily activities. Too much stress can weaken your immune system and increase your risk for physical illness. If you already  have a medical problem, stress can make it worse. CAUSES  All sorts of life events may cause stress. An  event that causes stress for one person may not be stressful for another person. Major life events commonly cause stress. These may be positive or negative. Examples include losing your job, moving into a new home, getting married, having a baby, or losing a loved one. Less obvious life events may also cause stress, especially if they occur day after day or in combination. Examples include working long hours, driving in traffic, caring for children, being in debt, or being in a difficult relationship. SIGNS AND SYMPTOMS Stress may cause emotional symptoms including, the following:  Anxiety. This is feeling worried, afraid, on edge, overwhelmed, or out of control.  Anger. This is feeling irritated or impatient.  Depression. This is feeling sad, down, helpless, or guilty.  Difficulty focusing, remembering, or making decisions. Stress may cause physical symptoms, including the following:   Aches and pains. These may affect your head, neck, back, stomach, or other areas of your body.  Tight muscles or clenched jaw.  Low energy or trouble sleeping. Stress may cause unhealthy behaviors, including the following:   Eating to feel better (overeating) or skipping meals.  Sleeping too little, too much, or both.  Working too much or putting off tasks (procrastination).  Smoking, drinking alcohol, or using drugs to feel better. DIAGNOSIS  Stress is diagnosed through an assessment by your health care provider. Your health care provider will ask questions about your symptoms and any stressful life events.Your health care provider will also ask about your medical history and may order blood tests or other tests. Certain medical conditions and medicine can cause physical symptoms similar to stress. Mental illness can cause emotional symptoms and unhealthy behaviors similar to stress. Your health care provider may refer you to a mental health professional for further evaluation.  TREATMENT  Stress  management is the recommended treatment for stress.The goals of stress management are reducing stressful life events and coping with stress in healthy ways.  Techniques for reducing stressful life events include the following:  Stress identification. Self-monitor for stress and identify what causes stress for you. These skills may help you to avoid some stressful events.  Time management. Set your priorities, keep a calendar of events, and learn to say "no." These tools can help you avoid making too many commitments. Techniques for coping with stress include the following:  Rethinking the problem. Try to think realistically about stressful events rather than ignoring them or overreacting. Try to find the positives in a stressful situation rather than focusing on the negatives.  Exercise. Physical exercise can release both physical and emotional tension. The key is to find a form of exercise you enjoy and do it regularly.  Relaxation techniques. These relax the body and mind. Examples include yoga, meditation, tai chi, biofeedback, deep breathing, progressive muscle relaxation, listening to music, being out in nature, journaling, and other hobbies. Again, the key is to find one or more that you enjoy and can do regularly.  Healthy lifestyle. Eat a balanced diet, get plenty of sleep, and do not smoke. Avoid using alcohol or drugs to relax.  Strong support network. Spend time with family, friends, or other people you enjoy being around.Express your feelings and talk things over with someone you trust. Counseling or talktherapy with a mental health professional may be helpful if you are having difficulty managing stress on your own. Medicine is typically not recommended for  the treatment of stress.Talk to your health care provider if you think you need medicine for symptoms of stress. HOME CARE INSTRUCTIONS  Keep all follow-up visits as directed by your health care provider.  Take all medicines  as directed by your health care provider. SEEK MEDICAL CARE IF:  Your symptoms get worse or you start having new symptoms.  You feel overwhelmed by your problems and can no longer manage them on your own. SEEK IMMEDIATE MEDICAL CARE IF:  You feel like hurting yourself or someone else. Document Released: 10/18/2000 Document Revised: 09/08/2013 Document Reviewed: 12/17/2012 [facility] Patient Information 2015 [place], [place]. This information is not intended to replace advice given to you by your health care provider. Make sure you discuss any questions you have with your health care provider.  Tension Headache A tension headache is a feeling of pain, pressure, or aching often felt over the front and sides of the head. The pain can be dull or can feel tight (constricting). It is the most common type of headache. Tension headaches are not normally associated with nausea or vomiting and do not get worse with physical activity. Tension headaches can last 30 minutes to several days.  CAUSES  The exact cause is not known, but it may be caused by chemicals and hormones in the brain that lead to pain. Tension headaches often begin after stress, anxiety, or depression. Other triggers may include:  Alcohol.  Caffeine (too much or withdrawal).  Respiratory infections (colds, flu, sinus infections).  Dental problems or teeth clenching.  Fatigue.  Holding your head and neck in one position too long while using a computer. SYMPTOMS   Pressure around the head.   Dull, aching head pain.   Pain felt over the front and sides of the head.   Tenderness in the muscles of the head, neck, and shoulders. DIAGNOSIS  A tension headache is often diagnosed based on:   Symptoms.   Physical examination.   A CT scan or MRI of your head. These tests may be ordered if symptoms are severe or unusual. TREATMENT  Medicines may be given to help relieve symptoms.  HOME CARE INSTRUCTIONS   Only take  over-the-counter or prescription medicines for pain or discomfort as directed by your caregiver.   Lie down in a dark, quiet room when you have a headache.   Keep a journal to find out what may be triggering your headaches. For example, write down:  What you eat and drink.  How much sleep you get.  Any change to your diet or medicines.  Try massage or other relaxation techniques.   Ice packs or heat applied to the head and neck can be used. Use these 3 to 4 times per day for 15 to 20 minutes each time, or as needed.   Limit stress.   Sit up straight, and do not tense your muscles.   Quit smoking if you smoke.  Limit alcohol use.  Decrease the amount of caffeine you drink, or stop drinking caffeine.  Eat and exercise regularly.  Get 7 to 9 hours of sleep, or as recommended by your caregiver.  Avoid excessive use of pain medicine as recurrent headaches can occur.  SEEK MEDICAL CARE IF:   You have problems with the medicines you were prescribed.  Your medicines do not work.  You have a change from the usual headache.  You have nausea or vomiting. SEEK IMMEDIATE MEDICAL CARE IF:   Your headache becomes severe.  You have a  fever.  You have a stiff neck.  You have loss of vision.  You have muscular weakness or loss of muscle control.  You lose your balance or have trouble walking.  You feel faint or pass out.  You have severe symptoms that are different from your first symptoms. MAKE SURE YOU:   Understand these instructions.  Will watch your condition.  Will get help right away if you are not doing well or get worse. Document Released: 04/24/2005 Document Revised: 07/17/2011 Document Reviewed: 04/14/2011 [facility] Patient Information 2015 [place], [place]. This information is not intended to replace advice given to you by your health care provider. Make sure you discuss any questions you have with your health care provider.

## 2014-03-18 NOTE — Patient Instructions (Addendum)
Relaxation and coping techniques as we discussed - even if for only a few minutes a few times per day with goal of "calm state of mind", that may help critical thinking and focus. Keep follow up with psychiatric provider and counselor, and follow up with Dr. Laney Pastor or Dr. Joseph Art in next few months, but refilled your ibuprofen and lidoderm patches today to get you through next few months. Return to the clinic or go to the nearest emergency room if any of your symptoms worsen or new symptoms occur.  Stress and Stress Management Stress is a normal reaction to life events. It is what you feel when life demands more than you are used to or more than you can handle. Some stress can be useful. For example, the stress reaction can help you catch the last bus of the day, study for a test, or meet a deadline at work. But stress that occurs too often or for too long can cause problems. It can affect your emotional health and interfere with relationships and normal daily activities. Too much stress can weaken your immune system and increase your risk for physical illness. If you already have a medical problem, stress can make it worse. CAUSES  All sorts of life events may cause stress. An event that causes stress for one person may not be stressful for another person. Major life events commonly cause stress. These may be positive or negative. Examples include losing your job, moving into a new home, getting married, having a baby, or losing a loved one. Less obvious life events may also cause stress, especially if they occur day after day or in combination. Examples include working long hours, driving in traffic, caring for children, being in debt, or being in a difficult relationship. SIGNS AND SYMPTOMS Stress may cause emotional symptoms including, the following:  Anxiety. This is feeling worried, afraid, on edge, overwhelmed, or out of control.  Anger. This is feeling irritated or impatient.  Depression.  This is feeling sad, down, helpless, or guilty.  Difficulty focusing, remembering, or making decisions. Stress may cause physical symptoms, including the following:   Aches and pains. These may affect your head, neck, back, stomach, or other areas of your body.  Tight muscles or clenched jaw.  Low energy or trouble sleeping. Stress may cause unhealthy behaviors, including the following:   Eating to feel better (overeating) or skipping meals.  Sleeping too little, too much, or both.  Working too much or putting off tasks (procrastination).  Smoking, drinking alcohol, or using drugs to feel better. DIAGNOSIS  Stress is diagnosed through an assessment by your health care provider. Your health care provider will ask questions about your symptoms and any stressful life events.Your health care provider will also ask about your medical history and may order blood tests or other tests. Certain medical conditions and medicine can cause physical symptoms similar to stress. Mental illness can cause emotional symptoms and unhealthy behaviors similar to stress. Your health care provider may refer you to a mental health professional for further evaluation.  TREATMENT  Stress management is the recommended treatment for stress.The goals of stress management are reducing stressful life events and coping with stress in healthy ways.  Techniques for reducing stressful life events include the following:  Stress identification. Self-monitor for stress and identify what causes stress for you. These skills may help you to avoid some stressful events.  Time management. Set your priorities, keep a calendar of events, and learn to say "no." These  tools can help you avoid making too many commitments. Techniques for coping with stress include the following:  Rethinking the problem. Try to think realistically about stressful events rather than ignoring them or overreacting. Try to find the positives in a  stressful situation rather than focusing on the negatives.  Exercise. Physical exercise can release both physical and emotional tension. The key is to find a form of exercise you enjoy and do it regularly.  Relaxation techniques. These relax the body and mind. Examples include yoga, meditation, tai chi, biofeedback, deep breathing, progressive muscle relaxation, listening to music, being out in nature, journaling, and other hobbies. Again, the key is to find one or more that you enjoy and can do regularly.  Healthy lifestyle. Eat a balanced diet, get plenty of sleep, and do not smoke. Avoid using alcohol or drugs to relax.  Strong support network. Spend time with family, friends, or other people you enjoy being around.Express your feelings and talk things over with someone you trust. Counseling or talktherapy with a mental health professional may be helpful if you are having difficulty managing stress on your own. Medicine is typically not recommended for the treatment of stress.Talk to your health care provider if you think you need medicine for symptoms of stress. HOME CARE INSTRUCTIONS  Keep all follow-up visits as directed by your health care provider.  Take all medicines as directed by your health care provider. SEEK MEDICAL CARE IF:  Your symptoms get worse or you start having new symptoms.  You feel overwhelmed by your problems and can no longer manage them on your own. SEEK IMMEDIATE MEDICAL CARE IF:  You feel like hurting yourself or someone else. Document Released: 10/18/2000 Document Revised: 09/08/2013 Document Reviewed: 12/17/2012 [facility] Patient Information 2015 [place], [place]. This information is not intended to replace advice given to you by your health care provider. Make sure you discuss any questions you have with your health care provider.  Tension Headache A tension headache is a feeling of pain, pressure, or aching often felt over the front and sides of the head.  The pain can be dull or can feel tight (constricting). It is the most common type of headache. Tension headaches are not normally associated with nausea or vomiting and do not get worse with physical activity. Tension headaches can last 30 minutes to several days.  CAUSES  The exact cause is not known, but it may be caused by chemicals and hormones in the brain that lead to pain. Tension headaches often begin after stress, anxiety, or depression. Other triggers may include:  Alcohol.  Caffeine (too much or withdrawal).  Respiratory infections (colds, flu, sinus infections).  Dental problems or teeth clenching.  Fatigue.  Holding your head and neck in one position too long while using a computer. SYMPTOMS   Pressure around the head.   Dull, aching head pain.   Pain felt over the front and sides of the head.   Tenderness in the muscles of the head, neck, and shoulders. DIAGNOSIS  A tension headache is often diagnosed based on:   Symptoms.   Physical examination.   A CT scan or MRI of your head. These tests may be ordered if symptoms are severe or unusual. TREATMENT  Medicines may be given to help relieve symptoms.  HOME CARE INSTRUCTIONS   Only take over-the-counter or prescription medicines for pain or discomfort as directed by your caregiver.   Lie down in a dark, quiet room when you have a headache.  Keep a journal to find out what may be triggering your headaches. For example, write down:  What you eat and drink.  How much sleep you get.  Any change to your diet or medicines.  Try massage or other relaxation techniques.   Ice packs or heat applied to the head and neck can be used. Use these 3 to 4 times per day for 15 to 20 minutes each time, or as needed.   Limit stress.   Sit up straight, and do not tense your muscles.   Quit smoking if you smoke.  Limit alcohol use.  Decrease the amount of caffeine you drink, or stop drinking  caffeine.  Eat and exercise regularly.  Get 7 to 9 hours of sleep, or as recommended by your caregiver.  Avoid excessive use of pain medicine as recurrent headaches can occur.  SEEK MEDICAL CARE IF:   You have problems with the medicines you were prescribed.  Your medicines do not work.  You have a change from the usual headache.  You have nausea or vomiting. SEEK IMMEDIATE MEDICAL CARE IF:   Your headache becomes severe.  You have a fever.  You have a stiff neck.  You have loss of vision.  You have muscular weakness or loss of muscle control.  You lose your balance or have trouble walking.  You feel faint or pass out.  You have severe symptoms that are different from your first symptoms. MAKE SURE YOU:   Understand these instructions.  Will watch your condition.  Will get help right away if you are not doing well or get worse. Document Released: 04/24/2005 Document Revised: 07/17/2011 Document Reviewed: 04/14/2011 [facility] Patient Information 2015 [place], [place]. This information is not intended to replace advice given to you by your health care provider. Make sure you discuss any questions you have with your health care provider.

## 2014-03-23 ENCOUNTER — Telehealth: Payer: Self-pay

## 2014-03-23 NOTE — Telephone Encounter (Signed)
PA needed for lidocaine patches. Started/saved on covermymeds, but Dr Neva SeatGreene, can you please review list below and advise which indication I should use for pt? Dx is only neck pain in EPIC which is not specific enough. I have copied the possibly answers to question on PA form:  What is the indication or diagnosis? Postherpetic neuralgia (PHN - pain that occurs after a shingles outbreak)  Neuropathic pain  Low back pain  Osteoarthritis (OA)   Myofascial pain as adjunctive therapy  Carpal Tunnel Syndrome  Rheumatoid Arthritis (RA)   Sciatica  Fibromyalgia  Pain associated with rib fractures   All other indications and diagnoses

## 2014-03-23 NOTE — Telephone Encounter (Signed)
Can we see how it went through when Dr. Merla Richesoolittle prescribed this in 01/2013?  I just refilled it, not a new start. Possibly myofascial pain, but would check to see how this was linked prior if possible. Thanks.

## 2014-03-25 NOTE — Telephone Encounter (Signed)
In hindsight, this would not really be myofascial pain. I am not sure which of these would apply to her pain, and as prior auth not required last time, I am not sure what would cover these. I will route these notes to Dr. Merla Richesoolittle as well to see if he has any input on getting this covered as he saw her in September. Thanks. -JG.

## 2014-03-25 NOTE — Telephone Encounter (Signed)
Dr Neva SeatGreene, I don't see that we had to do a PA for it when first Rxd. Perhaps pt's ins or plan changed. I have very little success getting this approved for pt's unless it's for post herpetic neuralgia, but will certainly try. I have never tried to get it approved for myofascial pain though. Would you like me to try that? I don't think it will be approved for neck pain.

## 2014-03-27 MED ORDER — LIDOCAINE 5 % EX OINT: 1.0000 "application " | TOPICAL_OINTMENT | Freq: Two times a day (BID) | CUTANEOUS | Status: DC | PRN

## 2014-03-27 NOTE — Telephone Encounter (Signed)
Call pt - advise her that lidoderm patches not covered. Can try lidocaine gel BID prn to affected area as this may be easier to obtain per Dr. Merla Richesoolittle. Sent to pharmacy. Let me know if she has questions.

## 2014-03-27 NOTE — Telephone Encounter (Signed)
i ve been trying lidocaine 5%ointment to see if we can bypass requirements for patch only being covered for PHN

## 2014-03-28 NOTE — Telephone Encounter (Signed)
Spoke with pt. She will try this. Mora BellmanBarbara FYI

## 2014-05-15 ENCOUNTER — Ambulatory Visit (INDEPENDENT_AMBULATORY_CARE_PROVIDER_SITE_OTHER): Payer: 59 | Admitting: Family Medicine

## 2014-05-15 VITALS — BP 118/66 | HR 77 | Temp 98.4°F | Resp 16 | Ht 67.0 in | Wt 164.0 lb

## 2014-05-15 DIAGNOSIS — R300 Dysuria: Secondary | ICD-10-CM

## 2014-05-15 DIAGNOSIS — J069 Acute upper respiratory infection, unspecified: Secondary | ICD-10-CM

## 2014-05-15 DIAGNOSIS — R05 Cough: Secondary | ICD-10-CM

## 2014-05-15 DIAGNOSIS — R350 Frequency of micturition: Secondary | ICD-10-CM

## 2014-05-15 DIAGNOSIS — G43009 Migraine without aura, not intractable, without status migrainosus: Secondary | ICD-10-CM

## 2014-05-15 DIAGNOSIS — R059 Cough, unspecified: Secondary | ICD-10-CM

## 2014-05-15 LAB — POCT URINALYSIS DIPSTICK
Bilirubin, UA: NEGATIVE
Blood, UA: NEGATIVE
Glucose, UA: NEGATIVE
Ketones, UA: NEGATIVE
Leukocytes, UA: NEGATIVE
Nitrite, UA: NEGATIVE
Protein, UA: NEGATIVE
Spec Grav, UA: 1.02
Urobilinogen, UA: 0.2
pH, UA: 7

## 2014-05-15 LAB — POCT UA - MICROSCOPIC ONLY
Casts, Ur, LPF, POC: NEGATIVE
Crystals, Ur, HPF, POC: NEGATIVE
RBC, urine, microscopic: NEGATIVE
WBC, Ur, HPF, POC: NEGATIVE
Yeast, UA: NEGATIVE

## 2014-05-15 MED ORDER — LEVOFLOXACIN 500 MG PO TABS: 500.0000 mg | ORAL_TABLET | Freq: Every day | ORAL | Status: DC

## 2014-05-15 MED ORDER — BUTALBITAL-APAP-CAFFEINE 50-325-40 MG PO TABS: 1.0000 | ORAL_TABLET | Freq: Four times a day (QID) | ORAL | Status: AC | PRN

## 2014-05-15 MED ORDER — HYDROCOD POLST-CHLORPHEN POLST 10-8 MG/5ML PO LQCR: 5.0000 mL | Freq: Every evening | ORAL | Status: DC | PRN

## 2014-05-15 MED ORDER — FLUCONAZOLE 150 MG PO TABS: 150.0000 mg | ORAL_TABLET | Freq: Once | ORAL | Status: DC

## 2014-05-15 MED ORDER — ONDANSETRON 8 MG PO TBDP: 8.0000 mg | ORAL_TABLET | Freq: Three times a day (TID) | ORAL | Status: DC | PRN

## 2014-05-15 NOTE — Progress Notes (Signed)
Subjective:  This chart was scribed for Robyn Haber, MD by Molli Posey, Medical scribe. This patient was seen in ROOM 11 and the patient's care was started 2:00 PM.   Patient ID: [name], female    DOB: 02-07-1981, 34 y.o.   MRN: [id number]   Chief Complaint  Patient presents with  . Dysuria    x4 days; believes it is an kidney infection  . Urinary Frequency  . Back Pain  . Medication Refill   HPI HPI Comments: JEZEL BASTO is a 34 y.o. female with a history of dyspnea, migraines, anxiety and depression who presents to [facility] complaining of dysuria for the last 4 days. Pt reports associated urinary frequency and back pain as symptoms. Pt is worried the pain may be coming from her kidneys. She states that she thinks she may still be dehydrated. She reports a cough and congestion as well. Pt reports no modifying or alleviating factors at this time.   She also states she needs a medication refill for her migraine medication, Fioricet. Pt states her last migraine was last week. She says that she also needs a medication refill for her Zofran. Pt states that she re-started her Adderall 66m medication b.i.d recently.    Patient Active Problem List   Diagnosis Date Noted  . History of migraine headaches 02/03/2013  . Nicotine addiction 12/02/2012  . Palpitations   . Dyspnea   . Chest pain   . Anxiety    Past Medical History  Diagnosis Date  . Palpitations     H/O  . Dyspnea     H/O  . Chest pain     H/O  . Anxiety     H/O  . Depression   . Allergy   . Migraine    Allergies  Allergen Reactions  . Codeine   . Effexor [Venlafaxine]     Multiple somatic issues  . Latex   . Lexapro [Escitalopram Oxalate]     Mania   . Paxil [Paroxetine Hcl]     Feels detached   . Penicillins   . Sulfa Antibiotics   . Wellbutrin [Bupropion]     Became belligerant  . Zoloft [Sertraline Hcl]     rash  . Differin [Adapalene] Rash   Current Outpatient Prescriptions on  File Prior to Visit  Medication Sig Dispense Refill  . albuterol (PROVENTIL HFA;VENTOLIN HFA) 108 (90 BASE) MCG/ACT inhaler Inhale 2 puffs into the lungs every 4 (four) hours as needed for wheezing or shortness of breath (cough, shortness of breath or wheezing.). 1 Inhaler 1  . ALPRAZolam (XANAX) 0.5 MG tablet TAKE 1 TABLET BY MOUTH AT BEDTIME AS NEEDED FOR SLEEP 30 tablet 2  . cyclobenzaprine (FLEXERIL) 10 MG tablet Take 1 tablet (10 mg total) by mouth 3 (three) times daily as needed for muscle spasms. 90 tablet 3  . ibuprofen (ADVIL,MOTRIN) 800 MG tablet Take 1 tablet (800 mg total) by mouth every 8 (eight) hours as needed. 30 tablet 0  . L-Methylfolate (DEPLIN) 15 MG TABS Take by mouth.    . lidocaine (XYLOCAINE) 5 % ointment Apply 1 application topically 2 (two) times daily as needed. To pain on neck. 35.44 g 0  . Lidocaine-Hydrocortisone Ace (ANAMANTLE HC) 3-2.5 % KIT Place 1 application rectally 2 (two) times daily. 1 each 11  . valACYclovir (VALTREX) 500 MG tablet Take 1 tablet (500 mg total) by mouth daily. 1 qd 90 tablet 3   No current facility-administered medications on file  prior to visit.   Family History  Problem Relation Age of Onset  . Coronary artery disease Mother 21  . Hypertension Mother   . Diabetes Mother   . Heart disease Mother   . Hypertension Other   . Diabetes Other   . Other Father     Shot-violence  . Hypertension Brother   . Hypertension Brother   . Cancer Maternal Grandfather   . Cancer Paternal Grandmother   . Aneurysm Paternal Grandfather    History reviewed. No pertinent past surgical history.   Review of Systems  HENT: Positive for congestion.   Respiratory: Positive for cough.   Genitourinary: Positive for dysuria and frequency.  Musculoskeletal: Positive for back pain.       Objective:   Physical Exam  Constitutional: She is oriented to person, place, and time. She appears well-developed and well-nourished.  HENT:  Head: Normocephalic  and atraumatic.  Eyes: Right eye exhibits no discharge. Left eye exhibits no discharge.  Neck: Neck supple. No tracheal deviation present.  Cardiovascular: Normal rate.   Pulmonary/Chest: Effort normal. No respiratory distress.  Abdominal: She exhibits no distension.  Neurological: She is alert and oriented to person, place, and time.  Skin: Skin is warm and dry.  Psychiatric: She has a normal mood and affect. Her behavior is normal.  Nursing note and vitals reviewed.      Assessment & Plan:   This chart was scribed in my presence and reviewed by me personally.    ICD-9-CM ICD-10-CM   1. Dysuria 788.1 R30.0 POCT UA - Microscopic Only     POCT urinalysis dipstick     levofloxacin (LEVAQUIN) 500 MG tablet     fluconazole (DIFLUCAN) 150 MG tablet  2. Frequent urination 788.41 R35.0 POCT UA - Microscopic Only     POCT urinalysis dipstick     levofloxacin (LEVAQUIN) 500 MG tablet  3. Nonintractable migraine, unspecified migraine type 346.10 G43.009 butalbital-acetaminophen-caffeine (FIORICET) 50-325-40 MG per tablet     ondansetron (ZOFRAN ODT) 8 MG disintegrating tablet  4. URI (upper respiratory infection) 465.9 J06.9 levofloxacin (LEVAQUIN) 500 MG tablet     fluconazole (DIFLUCAN) 150 MG tablet  5. Cough 786.2 R05 chlorpheniramine-HYDROcodone (TUSSIONEX PENNKINETIC ER) 10-8 MG/5ML LQCR     Signed, Robyn Haber, MD

## 2014-07-14 ENCOUNTER — Ambulatory Visit (INDEPENDENT_AMBULATORY_CARE_PROVIDER_SITE_OTHER): Payer: 59 | Admitting: Family Medicine

## 2014-07-14 ENCOUNTER — Ambulatory Visit (INDEPENDENT_AMBULATORY_CARE_PROVIDER_SITE_OTHER): Payer: 59

## 2014-07-14 VITALS — BP 110/66 | HR 102 | Temp 99.1°F | Resp 16 | Ht 67.0 in | Wt 162.0 lb

## 2014-07-14 DIAGNOSIS — J101 Influenza due to other identified influenza virus with other respiratory manifestations: Secondary | ICD-10-CM

## 2014-07-14 DIAGNOSIS — M791 Myalgia, unspecified site: Secondary | ICD-10-CM

## 2014-07-14 DIAGNOSIS — R079 Chest pain, unspecified: Secondary | ICD-10-CM | POA: Diagnosis not present

## 2014-07-14 DIAGNOSIS — R0602 Shortness of breath: Secondary | ICD-10-CM | POA: Diagnosis not present

## 2014-07-14 DIAGNOSIS — R05 Cough: Secondary | ICD-10-CM | POA: Diagnosis not present

## 2014-07-14 DIAGNOSIS — R509 Fever, unspecified: Secondary | ICD-10-CM | POA: Diagnosis not present

## 2014-07-14 DIAGNOSIS — R059 Cough, unspecified: Secondary | ICD-10-CM

## 2014-07-14 LAB — POCT CBC
Granulocyte percent: 71.5 %G (ref 37–80)
HCT, POC: 40.1 % (ref 37.7–47.9)
Hemoglobin: 13.1 g/dL (ref 12.2–16.2)
Lymph, poc: 1.3 (ref 0.6–3.4)
MCH, POC: 30.8 pg (ref 27–31.2)
MCHC: 32.6 g/dL (ref 31.8–35.4)
MCV: 94.4 fL (ref 80–97)
MID (cbc): 0.7 (ref 0–0.9)
MPV: 7.2 fL (ref 0–99.8)
POC Granulocyte: 5.1 (ref 2–6.9)
POC LYMPH PERCENT: 18.6 %L (ref 10–50)
POC MID %: 9.9 %M (ref 0–12)
Platelet Count, POC: 210 10*3/uL (ref 142–424)
RBC: 4.25 M/uL (ref 4.04–5.48)
RDW, POC: 13.1 %
WBC: 7.1 10*3/uL (ref 4.6–10.2)

## 2014-07-14 LAB — POCT INFLUENZA A/B
Influenza A, POC: POSITIVE
Influenza B, POC: NEGATIVE

## 2014-07-14 MED ORDER — BENZONATATE 100 MG PO CAPS: 100.0000 mg | ORAL_CAPSULE | Freq: Three times a day (TID) | ORAL | Status: DC | PRN

## 2014-07-14 MED ORDER — HYDROCODONE-HOMATROPINE 5-1.5 MG/5ML PO SYRP: 5.0000 mL | ORAL_SOLUTION | Freq: Every evening | ORAL | Status: DC | PRN

## 2014-07-14 MED ORDER — OSELTAMIVIR PHOSPHATE 75 MG PO CAPS: 75.0000 mg | ORAL_CAPSULE | Freq: Two times a day (BID) | ORAL | Status: DC

## 2014-07-14 MED ORDER — ALBUTEROL SULFATE HFA 108 (90 BASE) MCG/ACT IN AERS: 2.0000 | INHALATION_SPRAY | Freq: Four times a day (QID) | RESPIRATORY_TRACT | Status: DC | PRN

## 2014-07-14 MED ORDER — ALBUTEROL SULFATE (2.5 MG/3ML) 0.083% IN NEBU: 2.5000 mg | INHALATION_SOLUTION | Freq: Four times a day (QID) | RESPIRATORY_TRACT | Status: DC | PRN

## 2014-07-14 NOTE — Progress Notes (Signed)
MRN: [id number] DOB: 12-22-1980  Subjective:   Abigail White is a 34 y.o. female presenting for chief complaint of Generalized Body Aches; Fever; Shortness of Breath; Cough; and Diarrhea  Reports 3 day history of myalgias, subjective fever, nasal congestion, sore throat, loose stools, worsening cough sometimes productive with thick green sputum. Now having diffuse chest pain/pleuritic pain worse with coughing, chest congestion, chest tightness, shob since late Sunday. Has tried over the counter medications, alka-seltzer, has also used her son's Pro-Air with minimal relief. Has had 3 sick contacts including her son, dx with pneumonia and her co-worker dx with the flu. Also, patient has smoked ~1/2 ppd for 17 years, reports anywhere from 1 cigarette to 1/2 pack in the last month. Denies itchy or watery red eyes, sinus pain, ear pain, ear drainage, tooth pain, n/v, abdominal pain. Denies having COPD, hx of asthma. Denies any other aggravating or relieving factors, no other questions or concerns.  Morrie Sheldonshley has a current medication list which includes the following prescription(s): albuterol, alprazolam, amphetamine-dextroamphetamine, butalbital-acetaminophen-caffeine, cyclobenzaprine, fluconazole, ibuprofen, deplin, lidocaine-hydrocortisone ace, ondansetron, ondansetron, and valacyclovir. She is allergic to codeine; effexor; latex; lexapro; paxil; penicillins; sulfa antibiotics; wellbutrin; zoloft; and differin.  Morrie Sheldonshley  has a past medical history of Palpitations; Dyspnea; Chest pain; Anxiety; Depression; Allergy; and Migraine. Also  has no past surgical history on file.  ROS As in subjective.  Objective:   Vitals: BP 110/66 mmHg  Pulse 102  Temp(Src) 99.1 F (37.3 C) (Oral)  Resp 16  Ht 5\' 7"  (1.702 m)  Wt 162 lb (73.483 kg)  BMI 25.37 kg/m2  SpO2 98%  Physical Exam  Constitutional: She is oriented to person, place, and time and well-developed, well-nourished, and in no distress.    HENT:  TM's intact bilaterally, no effusions or erythema. Nasal turbinates inflamed and edematous, clear-yellow rhinorrhea. No sinus tenderness. No oropharyngeal exudates, erythema, tonsillar abscesses   Eyes: Conjunctivae are normal. Right eye exhibits no discharge. Left eye exhibits no discharge. No scleral icterus.  Neck: Neck supple. No thyromegaly present.  Cardiovascular: Regular rhythm and intact distal pulses.  Exam reveals no gallop and no friction rub.   No murmur heard. Pulmonary/Chest: No stridor. No respiratory distress. She has no wheezes. She has no rales. She exhibits no tenderness.  rhonchorous lung sounds, difficult for patient to breathe deeply, had multiple coughing fits on exam  Abdominal: Bowel sounds are normal.  Lymphadenopathy:    She has cervical adenopathy (bilateral, anterior).  Neurological: She is alert and oriented to person, place, and time.  Skin: Skin is warm and dry. No rash noted. No erythema. No pallor.   Results for orders placed or performed in visit on 07/14/14 (from the past 24 hour(s))  POCT CBC     Status: None   Collection Time: 07/14/14  3:24 PM  Result Value Ref Range   WBC 7.1 4.6 - 10.2 K/uL   Lymph, poc 1.3 0.6 - 3.4   POC LYMPH PERCENT 18.6 10 - 50 %L   MID (cbc) 0.7 0 - 0.9   POC MID % 9.9 0 - 12 %M   POC Granulocyte 5.1 2 - 6.9   Granulocyte percent 71.5 37 - 80 %G   RBC 4.25 4.04 - 5.48 M/uL   Hemoglobin 13.1 12.2 - 16.2 g/dL   HCT, POC 82.940.1 56.237.7 - 47.9 %   MCV 94.4 80 - 97 fL   MCH, POC 30.8 27 - 31.2 pg   MCHC 32.6 31.8 - 35.4 g/dL  RDW, POC 13.1 %   Platelet Count, POC 210 142 - 424 K/uL   MPV 7.2 0 - 99.8 fL  POCT Influenza A/B     Status: None   Collection Time: 07/14/14  3:24 PM  Result Value Ref Range   Influenza A, POC Positive    Influenza B, POC Negative    [facility] reading (PRIMARY) by  Dr. Clelia Croft and PA-Shantese Raven. Chest: Increased markings bilaterally in lower lung bases. Please comment.  Assessment and Plan :    1. Fever, unspecified fever cause 2. Cough 3. Chest pain, unspecified chest pain type 4. Myalgia 5. Influenza A 6. Shortness of breath - Start oseltamivir for influenza - Offered nebulizer treatment in office, patient declined, provided script for nebulized albuterol and albuterol inhaler, patient agreed to use this at home, also contracted to stop using son's albuterol supply, may also use Tessalon and Hycodan for cough - Wrote out of work until 07/17/2014, advised to follow up with me in 2 days, will consider PFT, smoking cessation counseling  Wallis Bamberg, PA-C [facility] [phone] 07/14/2014 3:41 PM

## 2014-07-14 NOTE — Patient Instructions (Addendum)
Please come back in 1-2 days for re-evaluation.   Influenza Influenza ("the flu") is a viral infection of the respiratory tract. It occurs more often in winter months because people spend more time in close contact with one another. Influenza can make you feel very sick. Influenza easily spreads from person to person (contagious). CAUSES  Influenza is caused by a virus that infects the respiratory tract. You can catch the virus by breathing in droplets from an infected person's cough or sneeze. You can also catch the virus by touching something that was recently contaminated with the virus and then touching your mouth, nose, or eyes. RISKS AND COMPLICATIONS You may be at risk for a more severe case of influenza if you smoke cigarettes, have diabetes, have chronic heart disease (such as heart failure) or lung disease (such as asthma), or if you have a weakened immune system. Elderly people and pregnant women are also at risk for more serious infections. The most common problem of influenza is a lung infection (pneumonia). Sometimes, this problem can require emergency medical care and may be life threatening. SIGNS AND SYMPTOMS  Symptoms typically last 4 to 10 days and may include:  Fever.  Chills.  Headache, body aches, and muscle aches.  Sore throat.  Chest discomfort and cough.  Poor appetite.  Weakness or feeling tired.  Dizziness.  Nausea or vomiting. DIAGNOSIS  Diagnosis of influenza is often made based on your history and a physical exam. A nose or throat swab test can be done to confirm the diagnosis. TREATMENT  In mild cases, influenza goes away on its own. Treatment is directed at relieving symptoms. For more severe cases, your health care provider may prescribe antiviral medicines to shorten the sickness. Antibiotic medicines are not effective because the infection is caused by a virus, not by bacteria. HOME CARE INSTRUCTIONS  Take medicines only as directed by your health  care provider.  Use a cool mist humidifier to make breathing easier.  Get plenty of rest until your temperature returns to normal. This usually takes 3 to 4 days.  Drink enough fluid to keep your urine clear or pale yellow.  Cover yourmouth and nosewhen coughing or sneezing,and wash your handswellto prevent thevirusfrom spreading.  Stay homefromwork orschool untilthe fever is gonefor at least 731full day. PREVENTION  An annual influenza vaccination (flu shot) is the best way to avoid getting influenza. An annual flu shot is now routinely recommended for all adults in the [place] SEEK MEDICAL CARE IF:  You experiencechest pain, yourcough worsens,or you producemore mucus.  Youhave nausea,vomiting, ordiarrhea.  Your fever returns or gets worse. SEEK IMMEDIATE MEDICAL CARE IF:  You havetrouble breathing, you become short of breath,or your skin ornails becomebluish.  You have severe painor stiffnessin the neck.  You develop a sudden headache, or pain in the face or ear.  You have nausea or vomiting that you cannot control. MAKE SURE YOU:   Understand these instructions.  Will watch your condition.  Will get help right away if you are not doing well or get worse. Document Released: 04/21/2000 Document Revised: 09/08/2013 Document Reviewed: 07/24/2011 [facility] Patient Information 2015 [employer] [place]. This information is not intended to replace advice given to you by your health care provider. Make sure you discuss any questions you have with your health care provider.

## 2014-07-16 ENCOUNTER — Ambulatory Visit (INDEPENDENT_AMBULATORY_CARE_PROVIDER_SITE_OTHER): Payer: 59

## 2014-07-17 ENCOUNTER — Ambulatory Visit (INDEPENDENT_AMBULATORY_CARE_PROVIDER_SITE_OTHER): Payer: 59 | Admitting: Family Medicine

## 2014-07-17 VITALS — BP 122/90 | HR 95 | Temp 98.3°F | Resp 16 | Ht 67.0 in | Wt 162.0 lb

## 2014-07-17 DIAGNOSIS — J101 Influenza due to other identified influenza virus with other respiratory manifestations: Secondary | ICD-10-CM

## 2014-07-17 DIAGNOSIS — R1013 Epigastric pain: Secondary | ICD-10-CM | POA: Diagnosis not present

## 2014-07-17 DIAGNOSIS — R05 Cough: Secondary | ICD-10-CM | POA: Diagnosis not present

## 2014-07-17 DIAGNOSIS — R059 Cough, unspecified: Secondary | ICD-10-CM

## 2014-07-17 MED ORDER — AZITHROMYCIN 250 MG PO TABS: ORAL_TABLET | ORAL | Status: DC

## 2014-07-17 MED ORDER — HYOSCYAMINE SULFATE 0.125 MG SL SUBL: 0.1250 mg | SUBLINGUAL_TABLET | SUBLINGUAL | Status: DC | PRN

## 2014-07-17 NOTE — Patient Instructions (Addendum)
I think the cramps are related to the flu virus.  Please start the z pak if the cough persists.       Hyoscyamine sublingual tablet What is this medicine? HYOSCYAMINE (hye oh SYE a meen) is used to treat stomach and bladder problems. This medicine is also used for rhinitis, to reduce some problems caused by Parkinson's disease, and for the treatment of poisoning with drugs that are usually used to treat myasthenia gravis. This medicine may be used for other purposes; ask your health care provider or pharmacist if you have questions. COMMON BRAND NAME(S): A-Spas S/L, HyoMax-SL, Hyosol SL, Levsin SL, OSCIMIN, Symax What should I tell my health care provider before I take this medicine? They need to know if you have any of these conditions: -difficulty passing urine -glaucoma -heart disease, or previous heart attack -myasthenia gravis -prostate trouble -stomach obstruction -ulcerative colitis -an unusual or allergic reaction to hyoscyamine, other medicines, foods, dyes, or preservatives -pregnant or trying to get pregnant -breast-feeding How should I use this medicine? Take this medicine by mouth. Follow the directions on the prescription label. These tablets may be placed under the tongue, and allowed to dissolve, swallowed whole, or chewed. Take your doses at regular intervals. Do not take your medicine more often than directed. Talk to your pediatrician regarding the use of this medicine in children. Special care may be needed. While this medicine may be prescribed for children as young as 12 years for selected conditions, precautions do apply. Overdosage: If you think you have taken too much of this medicine contact a poison control center or emergency room at once. NOTE: This medicine is only for you. Do not share this medicine with others. What if I miss a dose? If you miss a dose, take it as soon as you can. If it is almost time for your next dose, take only that dose. Do not take  double or extra doses. What may interact with this medicine? -amantadine -antacids -benztropine -donepezil -galantamine -medicines for hay fever and other allergies -medicines for mental depression -medicines for mental problems or psychotic disturbances -rivastigmine -tacrine This list may not describe all possible interactions. Give your health care provider a list of all the medicines, herbs, non-prescription drugs, or dietary supplements you use. Also tell them if you smoke, drink alcohol, or use illegal drugs. Some items may interact with your medicine. What should I watch for while using this medicine? You may get dizzy. Do not drive, use machinery, or do anything that needs mental alertness until you know how this medicine affects you. To reduce the risk of dizzy or fainting spells, do not sit or stand up quickly, especially if you are an older patient. Alcohol can make you more dizzy. Avoid alcoholic drinks. Stay out of bright light and wear sunglasses if this medicine makes your eyes more sensitive to light. Your mouth may get dry. Chewing sugarless gum or sucking hard candy, and drinking plenty of water may help. Contact your doctor if the problem does not go away or is severe. This medicine may cause dry eyes and blurred vision. If you wear contact lenses you may feel some discomfort. Lubricating drops may help. See your eye doctor if the problem does not go away or is severe. Avoid extreme heat (e.g., hot tubs, saunas). This medicine can cause you to sweat less than normal. Your body temperature could increase to dangerous levels, which may lead to heat stroke. What side effects may I notice from receiving this  medicine? Side effects that you should report to your doctor or health care professional as soon as possible: -anxiety, nervousness -confusion -dizziness or fainting spells -fast heartbeat -fever -pain or difficulty passing urine -unusually weak or tired -vomiting Side  effects that usually do not require medical attention (report to your doctor or health care professional if they continue or are bothersome): -altered taste -constipation -nausea This list may not describe all possible side effects. Call your doctor for medical advice about side effects. You may report side effects to FDA at 1-800-FDA-1088. Where should I keep my medicine? Keep out of the reach of children. Store at room temperature between 15 and 30 degrees C (59 and 86 degrees F). Throw away any unused medicine after the expiration date. NOTE: This sheet is a summary. It may not cover all possible information. If you have questions about this medicine, talk to your doctor, pharmacist, or health care provider.  2015, [employer] Standard. (2007-09-09 14:37:10)

## 2014-07-17 NOTE — Progress Notes (Signed)
   Subjective:    Patient ID: Abigail White, female    DOB: 09-07-1980, 34 y.o.   MRN: [id number]  This chart was scribed for Elvina SidleKurt Lauenstein, MD, by Ronney LionSuzanne Le, ED Scribe. This patient was seen in room 4 and the patient's care was started at 5:11 PM.   HPI  Chief Complaint  Patient presents with  . Follow-up    flu    HPI Comments: Abigail White is a 34 y.o. female who presents to [facility] follow-up after being seen here 2 days ago, on 07/14/14. Patient also had a positive Influenza A test. Patient had a sudden onset severe fever 3 days ago, which has improved. She complains of persistent SOB and congestion. Patient is a smoker, and a CXR ruled out pneumonia. Patient had a flu shot this year.  She complains of sharp, intermittent epigastric pain and LLQ pain that have been ongoing for the past 48 hours. She complains of associated nausea with the pain. She was only able to alleviate her pain by pulling her legs into her chest and lying supine. She thought her pain may have been due to the Tamiflu or cough medication she was taking. Dr. Verlee Monteayvon recently did an US pelvis that was negative for ovarian cysts.  Patient takes Zofran prn for migraines.  Patient's divorce will be finalized in 3 days.   Review of Systems  Constitutional: Positive for fever.  HENT: Positive for congestion.   Respiratory: Positive for shortness of breath.   Gastrointestinal: Positive for nausea and abdominal pain.       Objective:   Physical Exam  Constitutional: She is oriented to person, place, and time. She appears well-developed and well-nourished. No distress.  HENT:  Head: Normocephalic and atraumatic.  Eyes: Conjunctivae and EOM are normal.  Neck: Neck supple. No tracheal deviation present.  Cardiovascular: Normal rate.   Pulmonary/Chest: Effort normal. No respiratory distress.  Abdominal: Soft. There is tenderness. There is no rebound.  Tender diffusely with deep  palpation. No rebound.  Musculoskeletal: Normal range of motion.  Neurological: She is alert and oriented to person, place, and time.  Skin: Skin is warm and dry.  Psychiatric: She has a normal mood and affect. Her behavior is normal.  Nursing note and vitals reviewed.      Assessment & Plan:   This chart was scribed in my presence and reviewed by me personally.    ICD-9-CM ICD-10-CM   1. Influenza A 487.1 J10.1 hyoscyamine (LEVSIN/SL) 0.125 MG SL tablet  2. Abdominal pain, epigastric 789.06 R10.13 hyoscyamine (LEVSIN/SL) 0.125 MG SL tablet  3. Cough 786.2 R05 azithromycin (ZITHROMAX Z-PAK) 250 MG tablet     Signed, Elvina SidleKurt Lauenstein, MD

## 2014-07-29 NOTE — Progress Notes (Signed)
Reviewed documentation and xray and agree w/ assessment and plan. Norberto SorensonEva Hudsyn Champine, MD MPH  Dg Chest 2 View  07/14/2014   CLINICAL DATA:  Chest tightness. Initial encounter. Chest pain. Cough.  EXAM: CHEST  2 VIEW  COMPARISON:  05/22/2013.  FINDINGS: Cardiopericardial silhouette within normal limits. Mediastinal contours normal. Trachea midline. No airspace disease or effusion. Respiratory motion artifact is present on the lateral view.  Specifically, the lung bases appear within normal limits. No consolidation or pneumonia. Apparent increased density on the frontal projection is due to overlapping soft tissues.  IMPRESSION: No active cardiopulmonary disease.   Electronically Signed   By: Andreas NewportGeoffrey  Lamke M.D.   On: 07/14/2014 18:46

## 2014-10-09 ENCOUNTER — Ambulatory Visit (INDEPENDENT_AMBULATORY_CARE_PROVIDER_SITE_OTHER): Payer: 59 | Admitting: Physician Assistant

## 2014-10-09 VITALS — BP 110/64 | HR 84 | Temp 98.0°F | Resp 16 | Ht 67.0 in | Wt 162.0 lb

## 2014-10-09 DIAGNOSIS — R05 Cough: Secondary | ICD-10-CM

## 2014-10-09 DIAGNOSIS — J019 Acute sinusitis, unspecified: Secondary | ICD-10-CM | POA: Diagnosis not present

## 2014-10-09 DIAGNOSIS — G43809 Other migraine, not intractable, without status migrainosus: Secondary | ICD-10-CM

## 2014-10-09 DIAGNOSIS — R059 Cough, unspecified: Secondary | ICD-10-CM

## 2014-10-09 MED ORDER — LEVOFLOXACIN 500 MG PO TABS: 500.0000 mg | ORAL_TABLET | Freq: Every day | ORAL | Status: DC

## 2014-10-09 MED ORDER — BENZONATATE 100 MG PO CAPS: 100.0000 mg | ORAL_CAPSULE | Freq: Three times a day (TID) | ORAL | Status: DC | PRN

## 2014-10-09 MED ORDER — KETOROLAC TROMETHAMINE 60 MG/2ML IM SOLN
60.0000 mg | Freq: Once | INTRAMUSCULAR | Status: AC
  Administered 2014-10-09: 60 mg via INTRAMUSCULAR

## 2014-10-09 NOTE — Patient Instructions (Signed)
Take levaquin once a day for 7 days. Continue with tessalon perles. Eat lots of yogurt over the next week. Continue with neti pot and saline spray. If you are not getting better in 7-10 days return to be seen.

## 2014-10-09 NOTE — Progress Notes (Signed)
[facility] [address], [place] [id number] [phone]  Date:  10/09/2014   Name:  Abigail White   DOB:  Oct 17, 1980   MRN:  [id number]  PCP:  Elvina SidleLAUENSTEIN,KURT, MD    Chief Complaint: Sinusitis   History of Present Illness:  This is a 34 y.o. female with PMH anxiety, migraine headaches and tobacco abuse who is presenting with 2 weeks of facial pain, upper teeth pain and nasal congestion. Nasal discharge green/yellow and sometimes tinged with blood. Been taking allergra daily and was helping some but has gotten worse. She is coughing up mucous - feels she is coughing up drainage from her throat. Having some sore throat. Started having ear pain today. Has also felt dizzy and unbalanced today. She has also been trying advil cold and sinus, neti pot and afrin. Has not used afrin in 4-5 days. Has been using albuterol inhaler for the past 2 weeks before bed. At baseline she does not use albuterol. Had sinus surgery when she was 34 years old. Pt feels her sinus pressure is triggering a migraine. Toradol usually helps her when she has a migraine. Pt states in the past amoxicillin and doxy have not been helpful in treating her sinus infections. She states levaquin is the most effective.  Review of Systems:  Review of Systems  Constitutional: Negative for fever and chills.  HENT: Positive for congestion, ear discharge, sinus pressure and sore throat.   Eyes: Negative for redness.  Respiratory: Positive for cough. Negative for shortness of breath and wheezing.   Gastrointestinal: Negative for nausea and vomiting.  Skin: Negative for rash.  Allergic/Immunologic: Positive for environmental allergies.  Neurological: Positive for dizziness and headaches. Negative for weakness and numbness.  Hematological: Negative for adenopathy.    Patient Active Problem List   Diagnosis Date Noted  . History of migraine headaches 02/03/2013  . Nicotine addiction 12/02/2012  .  Palpitations   . Dyspnea   . Chest pain   . Anxiety     Prior to Admission medications   Medication Sig Start Date End Date Taking? Authorizing Provider  albuterol (PROVENTIL HFA;VENTOLIN HFA) 108 (90 BASE) MCG/ACT inhaler Inhale 2 puffs into the lungs every 6 (six) hours as needed for wheezing or shortness of breath (cough, shortness of breath or wheezing.). 07/14/14  Yes Wallis BambergMario Mani, PA-C  albuterol (PROVENTIL) (2.5 MG/3ML) 0.083% nebulizer solution Take 3 mLs (2.5 mg total) by nebulization every 6 (six) hours as needed for wheezing or shortness of breath. 07/14/14  Yes Wallis BambergMario Mani, PA-C  ALPRAZolam Prudy Feeler(XANAX) 0.5 MG tablet TAKE 1 TABLET BY MOUTH AT BEDTIME AS NEEDED FOR SLEEP 01/08/14  Yes Elvina SidleKurt Lauenstein, MD  amphetamine-dextroamphetamine (ADDERALL) 5 MG tablet Take 5 mg by mouth 2 (two) times daily.   Yes Historical Provider, MD  benzonatate (TESSALON) 100 MG capsule Take 1-2 capsules (100-200 mg total) by mouth 3 (three) times daily as needed for cough. 07/14/14  Yes Wallis BambergMario Mani, PA-C  butalbital-acetaminophen-caffeine (FIORICET) 50-325-40 MG per tablet Take 1-2 tablets by mouth every 6 (six) hours as needed for headache. 05/15/14 05/15/15 Yes Elvina SidleKurt Lauenstein, MD  cyclobenzaprine (FLEXERIL) 10 MG tablet Take 1 tablet (10 mg total) by mouth 3 (three) times daily as needed for muscle spasms. 02/01/13  Yes Tonye Pearsonobert P Doolittle, MD  fluconazole (DIFLUCAN) 150 MG tablet Take 1 tablet (150 mg total) by mouth once. 05/15/14  Yes Elvina SidleKurt Lauenstein, MD  ibuprofen (ADVIL,MOTRIN) 800 MG tablet Take 1 tablet (800 mg total) by  mouth every 8 (eight) hours as needed. 03/18/14  Yes Shade Flood, MD  L-Methylfolate (DEPLIN) 15 MG TABS Take by mouth.   Yes Historical Provider, MD  ondansetron (ZOFRAN ODT) 8 MG disintegrating tablet Take 1 tablet (8 mg total) by mouth every 8 (eight) hours as needed for nausea or vomiting. 05/15/14  Yes Elvina Sidle, MD  ondansetron (ZOFRAN) 8 MG tablet Take by mouth every 8 (eight) hours as  needed for nausea or vomiting.   Yes Historical Provider, MD                  Allergies  Allergen Reactions  . Effexor [Venlafaxine]     Multiple somatic issues  . Latex   . Lexapro [Escitalopram Oxalate]     Mania   . Paxil [Paroxetine Hcl]     Feels detached   . Penicillins   . Sulfa Antibiotics   . Wellbutrin [Bupropion]     Became belligerant  . Zoloft [Sertraline Hcl]     rash  . Differin [Adapalene] Rash    History reviewed. No pertinent past surgical history.  History  Substance Use Topics  . Smoking status: Current Every Day Smoker -- 1.00 packs/day    Types: Cigarettes  . Smokeless tobacco: Never Used  . Alcohol Use: 0.6 oz/week    1 Glasses of wine per week     Comment: rarely    Family History  Problem Relation Age of Onset  . Coronary artery disease Mother 2  . Hypertension Mother   . Diabetes Mother   . Heart disease Mother   . Hypertension Other   . Diabetes Other   . Other Father     Shot-violence  . Hypertension Brother   . Hypertension Brother   . Cancer Maternal Grandfather   . Cancer Paternal Grandmother   . Aneurysm Paternal Grandfather     Medication list has been reviewed and updated.  Physical Examination:  Physical Exam  Constitutional: She is oriented to person, place, and time. She appears well-developed and well-nourished. No distress.  HENT:  Head: Normocephalic and atraumatic.  Right Ear: Hearing, tympanic membrane, external ear and ear canal normal.  Left Ear: Hearing, tympanic membrane, external ear and ear canal normal.  Nose: Mucosal edema present. Right sinus exhibits maxillary sinus tenderness and frontal sinus tenderness. Left sinus exhibits maxillary sinus tenderness and frontal sinus tenderness.  Mouth/Throat: Uvula is midline and mucous membranes are normal. Posterior oropharyngeal erythema present. No oropharyngeal exudate or posterior oropharyngeal edema.  Eyes: Conjunctivae and lids are normal. Right eye  exhibits no discharge. Left eye exhibits no discharge. No scleral icterus.  Cardiovascular: Normal rate, regular rhythm, normal heart sounds and normal pulses.   No murmur heard. Pulmonary/Chest: Effort normal and breath sounds normal. No respiratory distress. She has no wheezes. She has no rhonchi. She has no rales.  Musculoskeletal: Normal range of motion.  Lymphadenopathy:       Head (right side): No submental, no submandibular and no tonsillar adenopathy present.       Head (left side): No submental, no submandibular and no tonsillar adenopathy present.    She has cervical adenopathy (right anterior).  Neurological: She is alert and oriented to person, place, and time. She has normal reflexes. No cranial nerve deficit. Gait normal.  Skin: Skin is warm, dry and intact. No lesion and no rash noted.  Psychiatric: She has a normal mood and affect. Her speech is normal and behavior is normal. Thought content normal.  BP 110/64 mmHg  Pulse 84  Temp(Src) 98 F (36.7 C) (Oral)  Resp 16  Ht  (1.702 m)  Wt 162 lb (73.483 kg)  BMI 25.37 kg/m2  SpO2 99%  LMP 10/05/2014  Assessment and Plan:  1. Other migraine without status migrainosus, not intractable 2. Cough 3. Acute sinusitis D/t severity of symptoms and duration, will treat for sinusitis with levaquin (which pt states is the only abx that works) and Psychiatrist. She will continue home neti pots. Toradol for early onset migraine. She will return if symptoms not improving in 7-10 days. - ketorolac (TORADOL) injection 60 mg; Inject 2 mLs (60 mg total) into the muscle once. - benzonatate (TESSALON) 100 MG capsule; Take 1-2 capsules (100-200 mg total) by mouth 3 (three) times daily as needed for cough.  Dispense: 40 capsule; Refill: 0 - levofloxacin (LEVAQUIN) 500 MG tablet; Take 1 tablet (500 mg total) by mouth daily.  Dispense: 7 tablet; Refill: 0 - benzonatate (TESSALON) 100 MG capsule; Take 1-2 capsules (100-200 mg total) by mouth 3  (three) times daily as needed for cough.  Dispense: 40 capsule; Refill: 0   [place] [employer]. Dyke Brackett, MHS [facility]  10/11/2014

## 2014-11-13 ENCOUNTER — Other Ambulatory Visit: Payer: Self-pay | Admitting: Internal Medicine

## 2014-11-23 ENCOUNTER — Ambulatory Visit (INDEPENDENT_AMBULATORY_CARE_PROVIDER_SITE_OTHER): Payer: 59 | Admitting: Family Medicine

## 2014-11-23 VITALS — BP 102/63 | HR 87 | Temp 98.2°F | Resp 16 | Ht 66.75 in | Wt 157.0 lb

## 2014-11-23 DIAGNOSIS — K589 Irritable bowel syndrome without diarrhea: Secondary | ICD-10-CM | POA: Diagnosis not present

## 2014-11-23 DIAGNOSIS — F4323 Adjustment disorder with mixed anxiety and depressed mood: Secondary | ICD-10-CM | POA: Diagnosis not present

## 2014-11-23 DIAGNOSIS — F909 Attention-deficit hyperactivity disorder, unspecified type: Secondary | ICD-10-CM

## 2014-11-23 DIAGNOSIS — F988 Other specified behavioral and emotional disorders with onset usually occurring in childhood and adolescence: Secondary | ICD-10-CM

## 2014-11-23 DIAGNOSIS — G43009 Migraine without aura, not intractable, without status migrainosus: Secondary | ICD-10-CM | POA: Diagnosis not present

## 2014-11-23 MED ORDER — ONDANSETRON 8 MG PO TBDP: 8.0000 mg | ORAL_TABLET | Freq: Three times a day (TID) | ORAL | Status: DC | PRN

## 2014-11-23 MED ORDER — AMPHETAMINE-DEXTROAMPHETAMINE 5 MG PO TABS: 5.0000 mg | ORAL_TABLET | Freq: Two times a day (BID) | ORAL | Status: DC

## 2014-11-23 MED ORDER — ALPRAZOLAM 0.5 MG PO TABS: 0.5000 mg | ORAL_TABLET | Freq: Every evening | ORAL | Status: DC | PRN

## 2014-11-23 MED ORDER — HYOSCYAMINE SULFATE 0.125 MG SL SUBL: 0.1250 mg | SUBLINGUAL_TABLET | SUBLINGUAL | Status: DC | PRN

## 2014-11-23 NOTE — Progress Notes (Signed)
This chart was scribed for Dr. Robyn Haber, MD by Erling Conte, Medical Scribe. This patient was seen in Room 9 and the patient's care was started at 8:22 PM.   Patient ID: Abigail White MRN: [id number], DOB: 1980/07/26, 34 y.o. Date of Encounter: 11/23/2014, 8:21 PM  Primary Physician: Robyn Haber, MD  Chief Complaint:  Chief Complaint  Patient presents with  . Medication Refill    xanax  . Anxiety     HPI: 34 y.o. year old female with history below presents with a refill of her xanax medication. Pt has a h/o anxiety. She states she has been feeling very overwhelmed lately with her work and home life. Pt reports she has been extremely tearful. She states her IBS has been worse lately due to the anxiety. Pt is tolerating the Xanax well with no complaints. She denies any other complaints at this time.   Past Medical History  Diagnosis Date  . Palpitations     H/O  . Dyspnea     H/O  . Chest pain     H/O  . Anxiety     H/O  . Depression   . Allergy   . Migraine      Home Meds: Prior to Admission medications   Medication Sig Start Date End Date Taking? Authorizing Provider  albuterol (PROVENTIL HFA;VENTOLIN HFA) 108 (90 BASE) MCG/ACT inhaler Inhale 2 puffs into the lungs every 6 (six) hours as needed for wheezing or shortness of breath (cough, shortness of breath or wheezing.). 07/14/14  Yes Jaynee Eagles, PA-C  albuterol (PROVENTIL) (2.5 MG/3ML) 0.083% nebulizer solution Take 3 mLs (2.5 mg total) by nebulization every 6 (six) hours as needed for wheezing or shortness of breath. 07/14/14  Yes Jaynee Eagles, PA-C  ALPRAZolam Duanne Moron) 0.5 MG tablet TAKE 1 TABLET BY MOUTH AT BEDTIME AS NEEDED FOR SLEEP 01/08/14  Yes Robyn Haber, MD  amphetamine-dextroamphetamine (ADDERALL) 5 MG tablet Take 5 mg by mouth 2 (two) times daily.   Yes Historical Provider, MD  butalbital-acetaminophen-caffeine (FIORICET) 50-325-40 MG per tablet Take 1-2 tablets by mouth every 6 (six) hours as  needed for headache. 05/15/14 05/15/15 Yes Robyn Haber, MD  ibuprofen (ADVIL,MOTRIN) 800 MG tablet Take 1 tablet (800 mg total) by mouth every 8 (eight) hours as needed. 03/18/14  Yes Wendie Agreste, MD  L-Methylfolate (DEPLIN) 15 MG TABS Take by mouth.   Yes Historical Provider, MD  Lidocaine-Hydrocortisone Ace (ANAMANTLE HC) 3-2.5 % KIT Place 1 application rectally 2 (two) times daily. 02/01/13  Yes Leandrew Koyanagi, MD  ondansetron (ZOFRAN ODT) 8 MG disintegrating tablet Take 1 tablet (8 mg total) by mouth every 8 (eight) hours as needed for nausea or vomiting. 05/15/14  Yes Robyn Haber, MD  cyclobenzaprine (FLEXERIL) 10 MG tablet Take 1 tablet (10 mg total) by mouth 3 (three) times daily as needed for muscle spasms. Patient not taking: Reported on 11/23/2014 02/01/13   Leandrew Koyanagi, MD    Allergies:  Allergies  Allergen Reactions  . Effexor [Venlafaxine]     Multiple somatic issues  . Latex   . Lexapro [Escitalopram Oxalate]     Mania   . Paxil [Paroxetine Hcl]     Feels detached   . Penicillins   . Sulfa Antibiotics   . Wellbutrin [Bupropion]     Became belligerant  . Zoloft [Sertraline Hcl]     rash  . Differin [Adapalene] Rash    History   Social History  . Marital Status: Married  Spouse Name: N/A  . Number of Children: N/A  . Years of Education: N/A   Occupational History  . [employer]  Other   Social History Main Topics  . Smoking status: Current Every Day Smoker -- 1.00 packs/day    Types: Cigarettes  . Smokeless tobacco: Never Used  . Alcohol Use: 0.6 oz/week    1 Glasses of wine per week     Comment: rarely  . Drug Use: No  . Sexual Activity: Yes    Birth Control/ Protection: Patch   Other Topics Concern  . Not on file   Social History Narrative     Review of Systems: Constitutional: negative for chills, fever, night sweats, weight changes, or fatigue  HEENT: negative for vision changes, hearing loss, congestion, rhinorrhea, ST,  epistaxis, or sinus pressure Cardiovascular: negative for chest pain or palpitations Respiratory: negative for hemoptysis, wheezing, shortness of breath, or cough Abdominal: negative for abdominal pain, nausea, vomiting, diarrhea, or constipation Dermatological: negative for rash Neurologic: negative for headache, dizziness, or syncope Psych: positive for anxiety All other systems reviewed and are otherwise negative with the exception to those above and in the HPI.   Physical Exam: Blood pressure 102/63, pulse 87, temperature 98.2 F (36.8 C), temperature source Oral, resp. rate 16, height 5' 6.75" (1.695 m), weight 157 lb (71.215 kg), SpO2 98 %., Body mass index is 24.79 kg/(m^2). General: Well developed, well nourished, in no acute distress. Head: Normocephalic, atraumatic, eyes without discharge, sclera non-icteric, nares are without discharge. Bilateral auditory canals clear, TM's are without perforation, pearly grey and translucent with reflective cone of light bilaterally. Oral cavity moist, posterior pharynx without exudate, erythema, peritonsillar abscess, or post nasal drip.  Neck: Supple. No thyromegaly. Full ROM. No lymphadenopathy. Lungs: Clear bilaterally to auscultation without wheezes, rales, or rhonchi. Breathing is unlabored. Heart: RRR with S1 S2. No murmurs, rubs, or gallops appreciated. Abdomen: Soft, non-tender, non-distended with normoactive bowel sounds. No hepatomegaly. No rebound/guarding. No obvious abdominal masses. Msk:  Strength and tone normal for age. Extremities/Skin: Warm and dry. No clubbing or cyanosis. No edema. No rashes or suspicious lesions. Neuro: Alert and oriented X 3. Moves all extremities spontaneously. Gait is normal. CNII-XII grossly in tact. Psych:  Responds to questions appropriately with a normal affect.   Labs:   ASSESSMENT AND PLAN:  34 y.o. year old female with  1. Nonintractable migraine, unspecified migraine type   2. IBS (irritable  bowel syndrome)   3. ADD (attention deficit disorder)    Meds ordered this encounter  Medications  . ondansetron (ZOFRAN ODT) 8 MG disintegrating tablet    Sig: Take 1 tablet (8 mg total) by mouth every 8 (eight) hours as needed for nausea or vomiting.    Dispense:  20 tablet    Refill:  0  . amphetamine-dextroamphetamine (ADDERALL) 5 MG tablet    Sig: Take 1 tablet (5 mg total) by mouth 2 (two) times daily.    Dispense:  60 tablet    Refill:  0      Signed, Robyn Haber, MD 11/23/2014 8:21 PM

## 2014-11-23 NOTE — Patient Instructions (Addendum)
Attention Deficit Hyperactivity Disorder Attention deficit hyperactivity disorder (ADHD) is a problem with behavior issues based on the way the brain functions (neurobehavioral disorder). It is a common reason for behavior and academic problems in school. SYMPTOMS  There are 3 types of ADHD. The 3 types and some of the symptoms include:  Inattentive.  Gets bored or distracted easily.  Loses or forgets things. Forgets to hand in homework.  Has trouble organizing or completing tasks.  Difficulty staying on task.  An inability to organize daily tasks and school work.  Leaving projects, chores, or homework unfinished.  Trouble paying attention or responding to details. Careless mistakes.  Difficulty following directions. Often seems like is not listening.  Dislikes activities that require sustained attention (like chores or homework).  Hyperactive-impulsive.  Feels like it is impossible to sit still or stay in a seat. Fidgeting with hands and feet.  Trouble waiting turn.  Talking too much or out of turn. Interruptive.  Speaks or acts impulsively.  Aggressive, disruptive behavior.  Constantly busy or on the go; noisy.  Often leaves seat when they are expected to remain seated.  Often runs or climbs where it is not appropriate, or feels very restless.  Combined.  Has symptoms of both of the above. Often children with ADHD feel discouraged about themselves and with school. They often perform well below their abilities in school. As children get older, the excess motor activities can calm down, but the problems with paying attention and staying organized persist. Most children do not outgrow ADHD but with good treatment can learn to cope with the symptoms. DIAGNOSIS  When ADHD is suspected, the diagnosis should be made by professionals trained in ADHD. This professional will collect information about the individual suspected of having ADHD. Information must be collected from  various settings where the person lives, works, or attends school.  Diagnosis will include:  Confirming symptoms began in childhood.  Ruling out other reasons for the child's behavior.  The health care providers will check with the child's school and check their medical records.  They will talk to teachers and parents.  Behavior rating scales for the child will be filled out by those dealing with the child on a daily basis. A diagnosis is made only after all information has been considered. TREATMENT  Treatment usually includes behavioral treatment, tutoring or extra support in school, and stimulant medicines. Because of the way a person's brain works with ADHD, these medicines decrease impulsivity and hyperactivity and increase attention. This is different than how they would work in a person who does not have ADHD. Other medicines used include antidepressants and certain blood pressure medicines. Most experts agree that treatment for ADHD should address all aspects of the person's functioning. Along with medicines, treatment should include structured classroom management at school. Parents should reward good behavior, provide constant discipline, and set limits. Tutoring should be available for the child as needed. ADHD is a lifelong condition. If untreated, the disorder can have long-term serious effects into adolescence and adulthood. HOME CARE INSTRUCTIONS   Often with ADHD there is a lot of frustration among family members dealing with the condition. Blame and anger are also feelings that are common. In many cases, because the problem affects the family as a whole, the entire family may need help. A therapist can help the family find better ways to handle the disruptive behaviors of the person with ADHD and promote change. If the person with ADHD is young, most of the therapist's   work is with the parents. Parents will learn techniques for coping with and improving their child's behavior.  Sometimes only the child with the ADHD needs counseling. Your health care providers can help you make these decisions.  Children with ADHD may need help learning how to organize. Some helpful tips include:  Keep routines the same every day from wake-up time to bedtime. Schedule all activities, including homework and playtime. Keep the schedule in a place where the person with ADHD will often see it. Mark schedule changes as far in advance as possible.  Schedule outdoor and indoor recreation.  Have a place for everything and keep everything in its place. This includes clothing, backpacks, and school supplies.  Encourage writing down assignments and bringing home needed books. Work with your child's teachers for assistance in organizing school work.  Offer your child a well-balanced diet. Breakfast that includes a balance of whole grains, protein, and fruits or vegetables is especially important for school performance. Children should avoid drinks with caffeine including:  Soft drinks.  Coffee.  Tea.  However, some older children (adolescents) may find these drinks helpful in improving their attention. Because it can also be common for adolescents with ADHD to become addicted to caffeine, talk with your health care provider about what is a safe amount of caffeine intake for your child.  Children with ADHD need consistent rules that they can understand and follow. If rules are followed, give small rewards. Children with ADHD often receive, and expect, criticism. Look for good behavior and praise it. Set realistic goals. Give clear instructions. Look for activities that can foster success and self-esteem. Make time for pleasant activities with your child. Give lots of affection.  Parents are their children's greatest advocates. Learn as much as possible about ADHD. This helps you become a stronger and better advocate for your child. It also helps you educate your child's teachers and instructors  if they feel inadequate in these areas. Parent support groups are often helpful. A national group with local chapters is called Children and Adults with Attention Deficit Hyperactivity Disorder (CHADD). SEEK MEDICAL CARE IF:  Your child has repeated muscle twitches, cough, or speech outbursts.  Your child has sleep problems.  Your child has a marked loss of appetite.  Your child develops depression.  Your child has new or worsening behavioral problems.  Your child develops dizziness.  Your child has a racing heart.  Your child has stomach pains.  Your child develops headaches. SEEK IMMEDIATE MEDICAL CARE IF:  Your child has been diagnosed with depression or anxiety and the symptoms seem to be getting worse.  Your child has been depressed and suddenly appears to have increased energy or motivation.  You are worried that your child is having a bad reaction to a medication he or she is taking for ADHD. Document Released: 04/14/2002 Document Revised: 04/29/2013 Document Reviewed: 12/30/2012 [facility] Patient Information 2015 [place], [place]. This information is not intended to replace advice given to you by your health care provider. Make sure you discuss any questions you have with your health care provider. Irritable Bowel Syndrome Irritable bowel syndrome (IBS) is caused by a disturbance of normal bowel function and is a common digestive disorder. You may also hear this condition called spastic colon, mucous colitis, and irritable colon. There is no cure for IBS. However, symptoms often gradually improve or disappear with a good diet, stress management, and medicine. This condition usually appears in late adolescence or early adulthood. Women develop it twice  as often as men. CAUSES  After food has been digested and absorbed in the small intestine, waste material is moved into the large intestine, or colon. In the colon, water and salts are absorbed from the undigested products coming  from the small intestine. The remaining residue, or fecal material, is held for elimination. Under normal circumstances, gentle, rhythmic contractions of the bowel walls push the fecal material along the colon toward the rectum. In IBS, however, these contractions are irregular and poorly coordinated. The fecal material is either retained too long, resulting in constipation, or expelled too soon, producing diarrhea. SIGNS AND SYMPTOMS  The most common symptom of IBS is abdominal pain. It is often in the lower left side of the abdomen, but it may occur anywhere in the abdomen. The pain comes from spasms of the bowel muscles happening too much and from the buildup of gas and fecal material in the colon. This pain:  Can range from sharp abdominal cramps to a dull, continuous ache.  Often worsens soon after eating.  Is often relieved by having a bowel movement or passing gas. Abdominal pain is usually accompanied by constipation, but it may also produce diarrhea. The diarrhea often occurs right after a meal or upon waking up in the morning. The stools are often soft, watery, and flecked with mucus. Other symptoms of IBS include:  Bloating.  Loss of appetite.  Heartburn.  Backache.  Dull pain in the arms or shoulders.  Nausea.  Burping.  Vomiting.  Gas. IBS may also cause symptoms that are unrelated to the digestive system, such as:  Fatigue.  Headaches.  Anxiety.  Shortness of breath.  Trouble concentrating.  Dizziness. These symptoms tend to come and go. DIAGNOSIS  The symptoms of IBS may seem like symptoms of other, more serious digestive disorders. Your health care provider may want to perform tests to exclude these disorders.  TREATMENT Many medicines are available to help correct bowel function or relieve bowel spasms and abdominal pain. Among the medicines available are:  Laxatives for severe constipation and to help restore normal bowel habits.  Specific  antidiarrheal medicines to treat severe or lasting diarrhea.  Antispasmodic agents to relieve intestinal cramps. Your health care provider may also decide to treat you with a mild tranquilizer or sedative during unusually stressful periods in your life. Your health care provider may also prescribe antidepressant medicine. The use of this medicine has been shown to reduce pain and other symptoms of IBS. Remember that if any medicine is prescribed for you, you should take it exactly as directed. Make sure your health care provider knows how well it worked for you. HOME CARE INSTRUCTIONS   Take all medicines as directed by your health care provider.  Avoid foods that are high in fat or oils, such as heavy cream, butter, frankfurters, sausage, and other fatty meats.  Avoid foods that make you go to the bathroom, such as fruit, fruit juice, and dairy products.  Cut out carbonated drinks, chewing gum, and "gassy" foods such as beans and cabbage. This may help relieve bloating and burping.  Eat foods with bran, and drink plenty of liquids with the bran foods. This helps relieve constipation.  Keep track of what foods seem to bring on your symptoms.  Avoid emotionally charged situations or circumstances that produce anxiety.  Start or continue exercising.  Get plenty of rest and sleep. Document Released: 04/24/2005 Document Revised: 04/29/2013 Document Reviewed: 12/13/2007 [facility] Patient Information 2015 [place], [place]. This information is  not intended to replace advice given to you by your health care provider. Make sure you discuss any questions you have with your health care provider.

## 2014-12-29 ENCOUNTER — Emergency Department (HOSPITAL_BASED_OUTPATIENT_CLINIC_OR_DEPARTMENT_OTHER): Payer: Self-pay

## 2014-12-29 ENCOUNTER — Emergency Department (HOSPITAL_BASED_OUTPATIENT_CLINIC_OR_DEPARTMENT_OTHER)
Admission: EM | Admit: 2014-12-29 | Discharge: 2014-12-29 | Disposition: A | Discharge status: Home / Self Care | Payer: Self-pay | Attending: Emergency Medicine | Admitting: Emergency Medicine

## 2014-12-29 ENCOUNTER — Encounter (HOSPITAL_BASED_OUTPATIENT_CLINIC_OR_DEPARTMENT_OTHER): Payer: Self-pay

## 2014-12-29 DIAGNOSIS — Y9389 Activity, other specified: Secondary | ICD-10-CM | POA: Insufficient documentation

## 2014-12-29 DIAGNOSIS — Z79899 Other long term (current) drug therapy: Secondary | ICD-10-CM | POA: Insufficient documentation

## 2014-12-29 DIAGNOSIS — F419 Anxiety disorder, unspecified: Secondary | ICD-10-CM | POA: Insufficient documentation

## 2014-12-29 DIAGNOSIS — Z880 Allergy status to penicillin: Secondary | ICD-10-CM | POA: Insufficient documentation

## 2014-12-29 DIAGNOSIS — G43909 Migraine, unspecified, not intractable, without status migrainosus: Secondary | ICD-10-CM | POA: Insufficient documentation

## 2014-12-29 DIAGNOSIS — Z720 Tobacco use: Secondary | ICD-10-CM | POA: Insufficient documentation

## 2014-12-29 DIAGNOSIS — T148XXA Other injury of unspecified body region, initial encounter: Secondary | ICD-10-CM

## 2014-12-29 DIAGNOSIS — W208XXA Other cause of strike by thrown, projected or falling object, initial encounter: Secondary | ICD-10-CM | POA: Insufficient documentation

## 2014-12-29 DIAGNOSIS — Z91040 Latex allergy status: Secondary | ICD-10-CM | POA: Insufficient documentation

## 2014-12-29 DIAGNOSIS — Y998 Other external cause status: Secondary | ICD-10-CM | POA: Insufficient documentation

## 2014-12-29 DIAGNOSIS — F329 Major depressive disorder, single episode, unspecified: Secondary | ICD-10-CM | POA: Insufficient documentation

## 2014-12-29 DIAGNOSIS — Y9289 Other specified places as the place of occurrence of the external cause: Secondary | ICD-10-CM | POA: Insufficient documentation

## 2014-12-29 DIAGNOSIS — S9031XA Contusion of right foot, initial encounter: Secondary | ICD-10-CM | POA: Insufficient documentation

## 2014-12-29 MED ORDER — TRAMADOL HCL 50 MG PO TABS: 50.0000 mg | ORAL_TABLET | Freq: Four times a day (QID) | ORAL | Status: DC | PRN

## 2014-12-29 NOTE — Discharge Instructions (Signed)
Contusion °A contusion is a deep bruise. Contusions happen when an injury causes bleeding under the skin. Signs of bruising include pain, puffiness (swelling), and discolored skin. The contusion may turn blue, purple, or yellow. °HOME CARE  °· Put ice on the injured area. °¨ Put ice in a plastic bag. °¨ Place a towel between your skin and the bag. °¨ Leave the ice on for 15-20 minutes, 03-04 times a day. °· Only take medicine as told by your doctor. °· Rest the injured area. °· If possible, raise (elevate) the injured area to lessen puffiness. °GET HELP RIGHT AWAY IF:  °· You have more bruising or puffiness. °· You have pain that is getting worse. °· Your puffiness or pain is not helped by medicine. °MAKE SURE YOU:  °· Understand these instructions. °· Will watch your condition. °· Will get help right away if you are not doing well or get worse. °Document Released: 10/11/2007 Document Revised: 07/17/2011 Document Reviewed: 02/27/2011 °[employer]® Patient Information ©2015 [employer]. This information is not intended to replace advice given to you by your health care provider. Make sure you discuss any questions you have with your health care provider. ° °Cryotherapy °Cryotherapy means treatment with cold. Ice or gel packs can be used to reduce both pain and swelling. Ice is the most helpful within the first 24 to 48 hours after an injury or flare-up from overusing a muscle or joint. Sprains, strains, spasms, burning pain, shooting pain, and aches can all be eased with ice. Ice can also be used when recovering from surgery. Ice is effective, has very few side effects, and is safe for most people to use. °PRECAUTIONS  °Ice is not a safe treatment option for people with: °· Raynaud phenomenon. This is a condition affecting small blood vessels in the extremities. Exposure to cold may cause your problems to return. °· Cold hypersensitivity. There are many forms of cold hypersensitivity, including: °¨ Cold urticaria.  Red, itchy hives appear on the skin when the tissues begin to warm after being iced. °¨ Cold erythema. This is a red, itchy rash caused by exposure to cold. °¨ Cold hemoglobinuria. Red blood cells break down when the tissues begin to warm after being iced. The hemoglobin that carry oxygen are passed into the urine because they cannot combine with blood proteins fast enough. °· Numbness or altered sensitivity in the area being iced. °If you have any of the following conditions, do not use ice until you have discussed cryotherapy with your caregiver: °· Heart conditions, such as arrhythmia, angina, or chronic heart disease. °· High blood pressure. °· Healing wounds or open skin in the area being iced. °· Current infections. °· Rheumatoid arthritis. °· Poor circulation. °· Diabetes. °Ice slows the blood flow in the region it is applied. This is beneficial when trying to stop inflamed tissues from spreading irritating chemicals to surrounding tissues. However, if you expose your skin to cold temperatures for too long or without the proper protection, you can damage your skin or nerves. Watch for signs of skin damage due to cold. °HOME CARE INSTRUCTIONS °Follow these tips to use ice and cold packs safely. °· Place a dry or damp towel between the ice and skin. A damp towel will cool the skin more quickly, so you may need to shorten the time that the ice is used. °· For a more rapid response, add gentle compression to the ice. °· Ice for no more than 10 to 20 minutes at a time.   The bonier the area you are icing, the less time it will take to get the benefits of ice. °· Check your skin after 5 minutes to make sure there are no signs of a poor response to cold or skin damage. °· Rest 20 minutes or more between uses. °· Once your skin is numb, you can end your treatment. You can test numbness by very lightly touching your skin. The touch should be so light that you do not see the skin dimple from the pressure of your  fingertip. When using ice, most people will feel these normal sensations in this order: cold, burning, aching, and numbness. °· Do not use ice on someone who cannot communicate their responses to pain, such as small children or people with dementia. °HOW TO MAKE AN ICE PACK °Ice packs are the most common way to use ice therapy. Other methods include ice massage, ice baths, and cryosprays. Muscle creams that cause a cold, tingly feeling do not offer the same benefits that ice offers and should not be used as a substitute unless recommended by your caregiver. °To make an ice pack, do one of the following: °· Place crushed ice or a bag of frozen vegetables in a sealable plastic bag. Squeeze out the excess air. Place this bag inside another plastic bag. Slide the bag into a pillowcase or place a damp towel between your skin and the bag. °· Mix 3 parts water with 1 part rubbing alcohol. Freeze the mixture in a sealable plastic bag. When you remove the mixture from the freezer, it will be slushy. Squeeze out the excess air. Place this bag inside another plastic bag. Slide the bag into a pillowcase or place a damp towel between your skin and the bag. °SEEK MEDICAL CARE IF: °· You develop white spots on your skin. This may give the skin a blotchy (mottled) appearance. °· Your skin turns blue or pale. °· Your skin becomes waxy or hard. °· Your swelling gets worse. °MAKE SURE YOU:  °· Understand these instructions. °· Will watch your condition. °· Will get help right away if you are not doing well or get worse. °Document Released: 12/19/2010 Document Revised: 09/08/2013 Document Reviewed: 12/19/2010 °[employer]® Patient Information ©2015 [employer]. This information is not intended to replace advice given to you by your health care provider. Make sure you discuss any questions you have with your health care provider. ° °

## 2014-12-29 NOTE — ED Notes (Signed)
Rack of pans feel on pt's right foot at [facility]

## 2014-12-29 NOTE — ED Provider Notes (Signed)
CSN: [id number]     Arrival date & time 12/29/14  2110 History  This chart was scribed for Gilda Crease, MD by Octavia Heir, ED Scribe. This patient was seen in room MH06/MH06 and the patient's care was started at 9:18 PM.    Chief Complaint  Patient presents with  . Foot Injury      The history is provided by the patient. No language interpreter was used.   HPI Comments: Abigail White is a 34 y.o. female who presents to the Emergency Department complaining of sudden onset, gradual worsening right foot pain onset this afternoon. Pt notes about 10-12 muffin and bundt pans fell on her right foot and she notes kicking them to prevent them from falling on her son. She reports taking some advil to alleviate the pain with minimal relief. She denies any head injury or right leg pain.  Past Medical History  Diagnosis Date  . Palpitations     H/O  . Dyspnea     H/O  . Chest pain     H/O  . Anxiety     H/O  . Depression   . Allergy   . Migraine    History reviewed. No pertinent past surgical history. Family History  Problem Relation Age of Onset  . Coronary artery disease Mother 40  . Hypertension Mother   . Diabetes Mother   . Heart disease Mother   . Hypertension Other   . Diabetes Other   . Other Father     Shot-violence  . Hypertension Brother   . Hypertension Brother   . Cancer Maternal Grandfather   . Cancer Paternal Grandmother   . Aneurysm Paternal Grandfather    Social History  Substance Use Topics  . Smoking status: Current Every Day Smoker -- 1.00 packs/day    Types: Cigarettes  . Smokeless tobacco: Never Used  . Alcohol Use: 0.6 oz/week    1 Glasses of wine per week     Comment: rarely   OB History    No data available     Review of Systems  Musculoskeletal: Positive for joint swelling and arthralgias.  All other systems reviewed and are negative.     Allergies  Effexor; Latex; Lexapro; Paxil; Penicillins; Sulfa antibiotics; Wellbutrin;  Zoloft; and Differin  Home Medications   Prior to Admission medications   Medication Sig Start Date End Date Taking? Authorizing Provider  albuterol (PROVENTIL HFA;VENTOLIN HFA) 108 (90 BASE) MCG/ACT inhaler Inhale 2 puffs into the lungs every 6 (six) hours as needed for wheezing or shortness of breath (cough, shortness of breath or wheezing.). 07/14/14   Wallis Bamberg, PA-C  albuterol (PROVENTIL) (2.5 MG/3ML) 0.083% nebulizer solution Take 3 mLs (2.5 mg total) by nebulization every 6 (six) hours as needed for wheezing or shortness of breath. 07/14/14   Wallis Bamberg, PA-C  ALPRAZolam Prudy Feeler) 0.5 MG tablet Take 1 tablet (0.5 mg total) by mouth at bedtime as needed. for sleep 11/23/14   Elvina Sidle, MD  amphetamine-dextroamphetamine (ADDERALL) 5 MG tablet Take 1 tablet (5 mg total) by mouth 2 (two) times daily. Fill after January 20, 2015 11/23/14   Elvina Sidle, MD  butalbital-acetaminophen-caffeine (FIORICET) [phone] MG per tablet Take 1-2 tablets by mouth every 6 (six) hours as needed for headache. 05/15/14 05/15/15  Elvina Sidle, MD  cyclobenzaprine (FLEXERIL) 10 MG tablet Take 1 tablet (10 mg total) by mouth 3 (three) times daily as needed for muscle spasms. Patient not taking: Reported on 11/23/2014 02/01/13  Leandrew Koyanagi, MD  hyoscyamine (LEVSIN/SL) 0.125 MG SL tablet Place 1 tablet (0.125 mg total) under the tongue every 4 (four) hours as needed. 11/23/14   Robyn Haber, MD  ibuprofen (ADVIL,MOTRIN) 800 MG tablet Take 1 tablet (800 mg total) by mouth every 8 (eight) hours as needed. 03/18/14   Wendie Agreste, MD  L-Methylfolate (DEPLIN) 15 MG TABS Take by mouth.    Historical Provider, MD  Lidocaine-Hydrocortisone Ace (ANAMANTLE HC) 3-2.5 % KIT Place 1 application rectally 2 (two) times daily. 02/01/13   Leandrew Koyanagi, MD  ondansetron (ZOFRAN ODT) 8 MG disintegrating tablet Take 1 tablet (8 mg total) by mouth every 8 (eight) hours as needed for nausea or vomiting. 11/23/14   Robyn Haber, MD   Triage vitals: BP 124/65 mmHg  Pulse 101  Temp(Src) 98.4 F (36.9 C) (Oral)  Resp 16  Ht $R'5\' 7"'ii$  (1.702 m)  Wt 164 lb (74.39 kg)  BMI 25.68 kg/m2  SpO2 99% Physical Exam  Constitutional: She is oriented to person, place, and time. She appears well-developed and well-nourished. No distress.  HENT:  Head: Normocephalic and atraumatic.  Right Ear: Hearing normal.  Left Ear: Hearing normal.  Nose: Nose normal.  Mouth/Throat: Oropharynx is clear and moist and mucous membranes are normal.  Eyes: Conjunctivae and EOM are normal. Pupils are equal, round, and reactive to light.  Neck: Normal range of motion. Neck supple.  Cardiovascular: Regular rhythm, S1 normal and S2 normal.  Exam reveals no gallop and no friction rub.   No murmur heard. Pulmonary/Chest: Effort normal and breath sounds normal. No respiratory distress. She exhibits no tenderness.  Abdominal: Soft. Normal appearance and bowel sounds are normal. There is no hepatosplenomegaly. There is no tenderness. There is no rebound, no guarding, no tenderness at McBurney's point and negative Murphy's sign. No hernia.  Musculoskeletal: Normal range of motion.  Tenderness over dorsal aspect of right foot and toes   Neurological: She is alert and oriented to person, place, and time. She has normal strength. No cranial nerve deficit or sensory deficit. Coordination normal. GCS eye subscore is 4. GCS verbal subscore is 5. GCS motor subscore is 6.  Skin: Skin is warm, dry and intact. No rash noted. No cyanosis.  Psychiatric: She has a normal mood and affect. Her speech is normal and behavior is normal. Thought content normal.  Nursing note and vitals reviewed.   ED Course  Procedures  DIAGNOSTIC STUDIES: Oxygen Saturation is 99% on RA, normal by my interpretation.  COORDINATION OF CARE:  9:22 PM Discussed treatment plan which includes x-ray of right foot with pt at bedside and pt agreed to plan.  Labs Review Labs  Reviewed - No data to display  Imaging Review No results found. I have personally reviewed and evaluated these images and lab results as part of my medical decision-making.   EKG Interpretation None      MDM   Final diagnoses:  None   contusion  Resents to the ER for evaluation of right foot injury. Patient reports multiple heavy pans fell on her foot earlier this evening. She is complaining of diffuse pain across the top of her foot and toes. X-ray was negative. Treat with rest, ice, compression, elevation.  I personally performed the services described in this documentation, which was scribed in my presence. The recorded information has been reviewed and is accurate.     Orpah Greek, MD 12/29/14 [id number]

## 2014-12-30 ENCOUNTER — Ambulatory Visit (INDEPENDENT_AMBULATORY_CARE_PROVIDER_SITE_OTHER): Payer: 59 | Admitting: Family Medicine

## 2014-12-30 VITALS — BP 110/70 | HR 87 | Temp 98.4°F | Resp 16 | Ht 67.0 in | Wt 165.0 lb

## 2014-12-30 DIAGNOSIS — M542 Cervicalgia: Secondary | ICD-10-CM

## 2014-12-30 DIAGNOSIS — G44209 Tension-type headache, unspecified, not intractable: Secondary | ICD-10-CM | POA: Diagnosis not present

## 2014-12-30 DIAGNOSIS — S9031XA Contusion of right foot, initial encounter: Secondary | ICD-10-CM | POA: Diagnosis not present

## 2014-12-30 MED ORDER — IBUPROFEN 800 MG PO TABS: 800.0000 mg | ORAL_TABLET | Freq: Three times a day (TID) | ORAL | Status: DC | PRN

## 2014-12-30 NOTE — Progress Notes (Signed)
   Subjective:    Patient ID: Abigail White, female    DOB: July 20, 1980, 34 y.o.   MRN: [id number] This chart was scribed for Elvina Sidle, MD by Jolene Provost, Medical Scribe. This patient was seen in Room 12 and the patient's care was started a 7:18 PM.  Chief Complaint  Patient presents with  . Foot Injury    right foot yesterday    HPI HPI Comments: [name] is a 34 y.o. female who presents to [facility] complaining of foot pain, tingling and a sense of cold since she had an injury yesterday. She states pots and pans were falling for her sons head off the shelf at [place] and she caught many of them, and some of them also hit her foot. She states she has had a persistent feeling of pins and needles, and the feeling that her foot is in ice water, even though its not.   She had x-rays last night, which were normal, and she was given an Ace bandage and crutches, as well as a prescription for tramadol.  She works in a dialysis unit.   Review of Systems  Constitutional: Negative for fever and chills.  Musculoskeletal: Positive for joint swelling and gait problem.  Neurological: Positive for weakness Gabriel Rung secondary to pain). Negative for numbness.       Tingling in foot       Objective:   Physical Exam  Constitutional: She is oriented to person, place, and time. She appears well-developed and well-nourished. No distress.  HENT:  Head: Normocephalic and atraumatic.  Eyes: Pupils are equal, round, and reactive to light.  Neck: Neck supple.  Cardiovascular: Normal rate.   Pulmonary/Chest: Effort normal. No respiratory distress.  Musculoskeletal: Normal range of motion.  Mild ecchymosis without swelling over the dorsum of fright foot. No bony abnormality.   Neurological: She is alert and oriented to person, place, and time. Coordination normal.  Skin: Skin is warm and dry. She is not diaphoretic.  Psychiatric: She has a normal mood and affect. Her behavior is normal.  Nursing  note and vitals reviewed.   Filed Vitals:   12/30/14 1912  BP: 110/70  Pulse: 87  Temp: 98.4 F (36.9 C)  TempSrc: Oral  Resp: 16  Height:  (1.702 m)  Weight: 165 lb (74.844 kg)  SpO2: 99%      Assessment & Plan:    This chart was scribed in my presence and reviewed by me personally.    ICD-9-CM ICD-10-CM   1. Pain in the neck 723.1 M54.2 ibuprofen (ADVIL,MOTRIN) 800 MG tablet  2. Tension headache 307.81 G44.209 ibuprofen (ADVIL,MOTRIN) 800 MG tablet  3. Foot contusion, right, initial encounter 924.20 S90.31XA ibuprofen (ADVIL,MOTRIN) 800 MG tablet     Signed, Elvina Sidle, MD

## 2015-01-13 ENCOUNTER — Emergency Department (HOSPITAL_BASED_OUTPATIENT_CLINIC_OR_DEPARTMENT_OTHER)
Admission: EM | Admit: 2015-01-13 | Discharge: 2015-01-14 | Disposition: A | Discharge status: Home / Self Care | Payer: 59 | Attending: Emergency Medicine | Admitting: Emergency Medicine

## 2015-01-13 ENCOUNTER — Encounter (HOSPITAL_BASED_OUTPATIENT_CLINIC_OR_DEPARTMENT_OTHER): Payer: Self-pay | Admitting: Emergency Medicine

## 2015-01-13 ENCOUNTER — Emergency Department (HOSPITAL_BASED_OUTPATIENT_CLINIC_OR_DEPARTMENT_OTHER): Payer: 59

## 2015-01-13 DIAGNOSIS — Z79899 Other long term (current) drug therapy: Secondary | ICD-10-CM | POA: Insufficient documentation

## 2015-01-13 DIAGNOSIS — Z91040 Latex allergy status: Secondary | ICD-10-CM | POA: Insufficient documentation

## 2015-01-13 DIAGNOSIS — Z880 Allergy status to penicillin: Secondary | ICD-10-CM | POA: Insufficient documentation

## 2015-01-13 DIAGNOSIS — Z720 Tobacco use: Secondary | ICD-10-CM | POA: Insufficient documentation

## 2015-01-13 DIAGNOSIS — G43909 Migraine, unspecified, not intractable, without status migrainosus: Secondary | ICD-10-CM | POA: Insufficient documentation

## 2015-01-13 DIAGNOSIS — F909 Attention-deficit hyperactivity disorder, unspecified type: Secondary | ICD-10-CM | POA: Insufficient documentation

## 2015-01-13 DIAGNOSIS — R002 Palpitations: Secondary | ICD-10-CM

## 2015-01-13 DIAGNOSIS — F419 Anxiety disorder, unspecified: Secondary | ICD-10-CM | POA: Insufficient documentation

## 2015-01-13 DIAGNOSIS — I493 Ventricular premature depolarization: Secondary | ICD-10-CM | POA: Insufficient documentation

## 2015-01-13 DIAGNOSIS — R079 Chest pain, unspecified: Secondary | ICD-10-CM | POA: Diagnosis present

## 2015-01-13 DIAGNOSIS — F329 Major depressive disorder, single episode, unspecified: Secondary | ICD-10-CM | POA: Diagnosis not present

## 2015-01-13 HISTORY — DX: Carpal tunnel syndrome, unspecified upper limb: G56.00

## 2015-01-13 HISTORY — DX: Chronic fatigue, unspecified: R53.82

## 2015-01-13 HISTORY — DX: Attention-deficit hyperactivity disorder, unspecified type: F90.9

## 2015-01-13 HISTORY — DX: Post-traumatic stress disorder, unspecified: F43.10

## 2015-01-13 LAB — CBC WITH DIFFERENTIAL/PLATELET
Basophils Absolute: 0 10*3/uL (ref 0.0–0.1)
Basophils Relative: 0 % (ref 0–1)
Eosinophils Absolute: 0.2 10*3/uL (ref 0.0–0.7)
Eosinophils Relative: 1 % (ref 0–5)
HCT: 40.1 % (ref 36.0–46.0)
Hemoglobin: 13.6 g/dL (ref 12.0–15.0)
Lymphocytes Relative: 28 % (ref 12–46)
Lymphs Abs: 3.5 10*3/uL (ref 0.7–4.0)
MCH: 31.6 pg (ref 26.0–34.0)
MCHC: 33.9 g/dL (ref 30.0–36.0)
MCV: 93 fL (ref 78.0–100.0)
Monocytes Absolute: 1 10*3/uL (ref 0.1–1.0)
Monocytes Relative: 8 % (ref 3–12)
Neutro Abs: 7.6 10*3/uL (ref 1.7–7.7)
Neutrophils Relative %: 63 % (ref 43–77)
Platelets: 277 10*3/uL (ref 150–400)
RBC: 4.31 MIL/uL (ref 3.87–5.11)
RDW: 12.3 % (ref 11.5–15.5)
WBC: 12.2 10*3/uL — ABNORMAL HIGH (ref 4.0–10.5)

## 2015-01-13 LAB — BASIC METABOLIC PANEL
Anion gap: 6 (ref 5–15)
BUN: 11 mg/dL (ref 6–20)
CO2: 26 mmol/L (ref 22–32)
Calcium: 8.9 mg/dL (ref 8.9–10.3)
Chloride: 108 mmol/L (ref 101–111)
Creatinine, Ser: 0.63 mg/dL (ref 0.44–1.00)
GFR calc Af Amer: >60 mL/min (ref >60)
GFR calc non Af Amer: >60 mL/min (ref >60)
Glucose, Bld: 96 mg/dL (ref 65–99)
Potassium: 3.6 mmol/L (ref 3.5–5.1)
Sodium: 140 mmol/L (ref 135–145)

## 2015-01-13 NOTE — ED Notes (Signed)
Pt c/o intermittent chest pain and palpitations since 4pm today. Not currently experiencing pain.

## 2015-01-13 NOTE — ED Notes (Signed)
C/o cp and irregular ha, racing heart rate off and on since 1200 this pm  Denies pain at present

## 2015-01-14 LAB — TROPONIN I: Troponin I: <0.03 ng/mL (ref <0.031)

## 2015-01-14 LAB — TSH: TSH: 1.312 u[IU]/mL (ref 0.350–4.500)

## 2015-01-14 NOTE — ED Notes (Signed)
Pt aware that md is holding her for observation

## 2015-01-14 NOTE — ED Provider Notes (Signed)
CSN: [id number]     Arrival date & time 01/13/15  2208 History   First MD Initiated Contact with Patient 01/14/15 0015     Chief Complaint  Patient presents with  . Chest Pain     (Consider location/radiation/quality/duration/timing/severity/associated sxs/prior Treatment) HPI  This is a 34 year old female with a history of palpitations. She is here with episodes of palpitations which she describes as her heart beating rapidly and irregularly. It is accompanied by tightness in her chest which she rates as an 8 out of 10. She also feels short of breath when these episodes occur. She has had 3 of them since about 2 PM yesterday afternoon. They last 2-3 minutes each. She is able to abate them using vagal maneuvers. She is asymptomatic at the present time and has been since arrival.  Past Medical History  Diagnosis Date  . Palpitations     H/O  . Dyspnea     H/O  . Chest pain     H/O  . Anxiety     H/O  . Depression   . Allergy   . Migraine   . Chronic fatigue   . PTSD (post-traumatic stress disorder)   . ADHD (attention deficit hyperactivity disorder)   . Carpal tunnel syndrome    History reviewed. No pertinent past surgical history. Family History  Problem Relation Age of Onset  . Coronary artery disease Mother 58  . Hypertension Mother   . Diabetes Mother   . Heart disease Mother   . Hypertension Other   . Diabetes Other   . Other Father     Shot-violence  . Hypertension Brother   . Hypertension Brother   . Cancer Maternal Grandfather   . Cancer Paternal Grandmother   . Aneurysm Paternal Grandfather    Social History  Substance Use Topics  . Smoking status: Current Every Day Smoker -- 1.00 packs/day    Types: Cigarettes  . Smokeless tobacco: Never Used  . Alcohol Use: 0.6 oz/week    1 Glasses of wine per week     Comment: rarely   OB History    No data available     Review of Systems  All other systems reviewed and are negative.   Allergies  Effexor;  Latex; Lexapro; Paxil; Penicillins; Sulfa antibiotics; Wellbutrin; Zoloft; and Differin  Home Medications   Prior to Admission medications   Medication Sig Start Date End Date Taking? Authorizing Provider  albuterol (PROVENTIL HFA;VENTOLIN HFA) 108 (90 BASE) MCG/ACT inhaler Inhale 2 puffs into the lungs every 6 (six) hours as needed for wheezing or shortness of breath (cough, shortness of breath or wheezing.). 07/14/14   Jaynee Eagles, PA-C  albuterol (PROVENTIL) (2.5 MG/3ML) 0.083% nebulizer solution Take 3 mLs (2.5 mg total) by nebulization every 6 (six) hours as needed for wheezing or shortness of breath. 07/14/14   Jaynee Eagles, PA-C  ALPRAZolam Duanne Moron) 0.5 MG tablet Take 1 tablet (0.5 mg total) by mouth at bedtime as needed. for sleep 11/23/14   Robyn Haber, MD  amphetamine-dextroamphetamine (ADDERALL) 5 MG tablet Take 1 tablet (5 mg total) by mouth 2 (two) times daily. Fill after January 20, 2015 11/23/14   Robyn Haber, MD  butalbital-acetaminophen-caffeine (FIORICET) [phone] MG per tablet Take 1-2 tablets by mouth every 6 (six) hours as needed for headache. 05/15/14 05/15/15  Robyn Haber, MD  cyclobenzaprine (FLEXERIL) 10 MG tablet Take 1 tablet (10 mg total) by mouth 3 (three) times daily as needed for muscle spasms. 02/01/13   Linton Ham  Laney Pastor, MD  hyoscyamine (LEVSIN/SL) 0.125 MG SL tablet Place 1 tablet (0.125 mg total) under the tongue every 4 (four) hours as needed. 11/23/14   Robyn Haber, MD  ibuprofen (ADVIL,MOTRIN) 800 MG tablet Take 1 tablet (800 mg total) by mouth every 8 (eight) hours as needed. 12/30/14   Robyn Haber, MD  L-Methylfolate (DEPLIN) 15 MG TABS Take by mouth.    Historical Provider, MD  Lidocaine-Hydrocortisone Ace (ANAMANTLE HC) 3-2.5 % KIT Place 1 application rectally 2 (two) times daily. 02/01/13   Leandrew Koyanagi, MD  ondansetron (ZOFRAN ODT) 8 MG disintegrating tablet Take 1 tablet (8 mg total) by mouth every 8 (eight) hours as needed for nausea or  vomiting. 11/23/14   Robyn Haber, MD  traMADol (ULTRAM) 50 MG tablet Take 1 tablet (50 mg total) by mouth every 6 (six) hours as needed. 12/29/14   Orpah Greek, MD   BP 106/67 mmHg  Pulse 85  Temp(Src) 98 F (36.7 C) (Oral)  Resp 18  Ht $R'5\' 7"'QJ$  (1.702 m)  Wt 162 lb (73.483 kg)  BMI 25.37 kg/m2  SpO2 100%   Physical Exam  General: Well-developed, well-nourished female in no acute distress; appearance consistent with age of record HENT: normocephalic; atraumatic Eyes: pupils equal, round and reactive to light; extraocular muscles intact Neck: supple Heart: regular rate and rhythm; rare PVCs Lungs: clear to auscultation bilaterally Abdomen: soft; nondistended; nontender; bowel sounds present Extremities: No deformity; full range of motion; pulses normal Neurologic: Awake, alert and oriented; motor function intact in all extremities and symmetric; no facial droop Skin: Warm and dry Psychiatric: Normal mood and affect    ED Course  Procedures (including critical care time)   EKG Interpretation   Date/Time:  Wednesday January 13 2015 22:18:40 EDT Ventricular Rate:  82 PR Interval:  170 QRS Duration: 94 QT Interval:  382 QTC Calculation: 446 R Axis:   82 Text Interpretation:  Normal sinus rhythm Normal ECG Unchanged Confirmed  by Rayder Sullenger  MD, Jenny Reichmann ([id number]) on 01/14/2015 12:14:53 AM      MDM   Nursing notes and vitals signs, including pulse oximetry, reviewed.  Summary of this visit's results, reviewed by myself:  Labs:  Results for orders placed or performed during the hospital encounter of 01/13/15 (from the past 24 hour(s))  CBC with Differential     Status: Abnormal   Collection Time: 01/13/15 11:30 PM  Result Value Ref Range   WBC 12.2 (H) 4.0 - 10.5 K/uL   RBC 4.31 3.87 - 5.11 MIL/uL   Hemoglobin 13.6 12.0 - 15.0 g/dL   HCT 40.1 36.0 - 46.0 %   MCV 93.0 78.0 - 100.0 fL   MCH 31.6 26.0 - 34.0 pg   MCHC 33.9 30.0 - 36.0 g/dL   RDW 12.3 11.5 - 15.5 %    Platelets 277 150 - 400 K/uL   Neutrophils Relative % 63 43 - 77 %   Neutro Abs 7.6 1.7 - 7.7 K/uL   Lymphocytes Relative 28 12 - 46 %   Lymphs Abs 3.5 0.7 - 4.0 K/uL   Monocytes Relative 8 3 - 12 %   Monocytes Absolute 1.0 0.1 - 1.0 K/uL   Eosinophils Relative 1 0 - 5 %   Eosinophils Absolute 0.2 0.0 - 0.7 K/uL   Basophils Relative 0 0 - 1 %   Basophils Absolute 0.0 0.0 - 0.1 K/uL  Basic metabolic panel     Status: None   Collection Time: 01/13/15 11:30 PM  Result  Value Ref Range   Sodium 140 135 - 145 mmol/L   Potassium 3.6 3.5 - 5.1 mmol/L   Chloride 108 101 - 111 mmol/L   CO2 26 22 - 32 mmol/L   Glucose, Bld 96 65 - 99 mg/dL   BUN 11 6 - 20 mg/dL   Creatinine, Ser 0.63 0.44 - 1.00 mg/dL   Calcium 8.9 8.9 - 10.3 mg/dL   GFR calc non Af Amer >60 >60 mL/min   GFR calc Af Amer >60 >60 mL/min   Anion gap 6 5 - 15  Troponin I     Status: None   Collection Time: 01/13/15 11:30 PM  Result Value Ref Range   Troponin I <0.03 <0.031 ng/mL    Imaging Studies: Dg Chest 2 View  01/14/2015   CLINICAL DATA:  Mid to left-sided chest pain. Shortness of breath. Smoker.  EXAM: CHEST  2 VIEW  COMPARISON:  07/14/2014  FINDINGS: The heart size and mediastinal contours are within normal limits. Both lungs are clear. The visualized skeletal structures are unremarkable.  IMPRESSION: No active cardiopulmonary disease.   Electronically Signed   By: Lucienne Capers M.D.   On: 01/14/2015 00:10   1:55 AM Patient continues to be asymptomatic except for feeling an occasional PVC. Her rhythm strip for her ED stay, proximally 4 hours, was reviewed. The only abnormality seen was an occasional PVC. The patient's improvement earlier with a low maneuvers suggest supraventricular tachycardia. We will have her follow-up with cardiology for likely Holter monitoring.    Shanon Rosser, MD 01/14/15 0157

## 2015-01-18 ENCOUNTER — Ambulatory Visit (INDEPENDENT_AMBULATORY_CARE_PROVIDER_SITE_OTHER): Payer: 59 | Admitting: Family Medicine

## 2015-01-18 VITALS — BP 110/66 | HR 83 | Temp 98.2°F | Resp 18 | Ht 67.0 in | Wt 163.0 lb

## 2015-01-18 DIAGNOSIS — R002 Palpitations: Secondary | ICD-10-CM

## 2015-01-18 DIAGNOSIS — R0602 Shortness of breath: Secondary | ICD-10-CM | POA: Diagnosis not present

## 2015-01-18 DIAGNOSIS — R0789 Other chest pain: Secondary | ICD-10-CM

## 2015-01-18 MED ORDER — DILTIAZEM HCL ER COATED BEADS 120 MG PO CP24: 120.0000 mg | ORAL_CAPSULE | Freq: Every day | ORAL | Status: DC

## 2015-01-18 NOTE — Progress Notes (Addendum)
This chart was scribed for Robyn Haber, MD by Moises Blood, medical scribe at [facility].The patient was seen in exam room 3 and the patient's care was started at 4:29 PM.  Patient ID: Abigail White MRN: [id number], DOB: [birth year], 34 y.o. Date of Encounter: 01/18/2015  Primary Physician: Robyn Haber, MD  Chief Complaint:  Chief Complaint  Patient presents with  . Chest Pain    since wednesday, was seen in the emergency room about same problem    HPI:  Abigail White is a 34 y.o. female who presents to [facility] complaining of chest pain that started 5 days ago.  It's been happening intermittently all summer and believed it was due to stress at work. She took a vacation and thought it was better. She felt a crushing chest pain 5 days ago. When it occurs, she describes it taking her breath away. She feels spasms in her carotid and makes her a little lightheadedness. She denies nausea. Her mother had CHF when she was around late 74s.   She was seen in the emergency room 5 nights ago about the same issue. An EKG, chest x-ray, and blood work was done but no diagnosis was forthcoming. When she was at the ER, her heart rate would range from 60 to 120. They checked her thyroid at the ER but results aren't back yet. They think it's SVT. She has appointment with cardiology on the 22nd.   She took beta blockers for headaches in the past. She still smokes.  She's a single mother with 2 sons. She is frightened about what might happen to her. She works at the hospital.   Past Medical History  Diagnosis Date  . Palpitations     H/O  . Dyspnea     H/O  . Chest pain     H/O  . Anxiety     H/O  . Depression   . Allergy   . Migraine   . Chronic fatigue   . PTSD (post-traumatic stress disorder)   . ADHD (attention deficit hyperactivity disorder)   . Carpal tunnel syndrome      Home Meds: Prior to Admission medications   Medication Sig  Start Date End Date Taking? Authorizing Provider  ALPRAZolam Duanne Moron) 0.5 MG tablet Take 1 tablet (0.5 mg total) by mouth at bedtime as needed. for sleep 11/23/14  Yes Robyn Haber, MD  amphetamine-dextroamphetamine (ADDERALL) 5 MG tablet Take 1 tablet (5 mg total) by mouth 2 (two) times daily. Fill after January 20, 2015 11/23/14  Yes Robyn Haber, MD  hyoscyamine (LEVSIN/SL) 0.125 MG SL tablet Place 1 tablet (0.125 mg total) under the tongue every 4 (four) hours as needed. 11/23/14  Yes Robyn Haber, MD  L-Methylfolate (DEPLIN) 15 MG TABS Take by mouth.   Yes Historical Provider, MD  traMADol (ULTRAM) 50 MG tablet Take 1 tablet (50 mg total) by mouth every 6 (six) hours as needed. 12/29/14  Yes Orpah Greek, MD  albuterol (PROVENTIL HFA;VENTOLIN HFA) 108 (90 BASE) MCG/ACT inhaler Inhale 2 puffs into the lungs every 6 (six) hours as needed for wheezing or shortness of breath (cough, shortness of breath or wheezing.). Patient not taking: Reported on 01/18/2015 07/14/14   Jaynee Eagles, PA-C  albuterol (PROVENTIL) (2.5 MG/3ML) 0.083% nebulizer solution Take 3 mLs (2.5 mg total) by nebulization every 6 (six) hours as needed for wheezing or shortness of breath. Patient not taking: Reported on 01/18/2015 07/14/14   Jaynee Eagles,  PA-C  butalbital-acetaminophen-caffeine (FIORICET) 50-325-40 MG per tablet Take 1-2 tablets by mouth every 6 (six) hours as needed for headache. Patient not taking: Reported on 01/18/2015 05/15/14 05/15/15  Robyn Haber, MD  cyclobenzaprine (FLEXERIL) 10 MG tablet Take 1 tablet (10 mg total) by mouth 3 (three) times daily as needed for muscle spasms. Patient not taking: Reported on 01/18/2015 02/01/13   Leandrew Koyanagi, MD  ibuprofen (ADVIL,MOTRIN) 800 MG tablet Take 1 tablet (800 mg total) by mouth every 8 (eight) hours as needed. Patient not taking: Reported on 01/18/2015 12/30/14   Robyn Haber, MD  Lidocaine-Hydrocortisone Ace (ANAMANTLE HC) 3-2.5 % KIT Place 1  application rectally 2 (two) times daily. Patient not taking: Reported on 01/18/2015 02/01/13   Leandrew Koyanagi, MD  ondansetron (ZOFRAN ODT) 8 MG disintegrating tablet Take 1 tablet (8 mg total) by mouth every 8 (eight) hours as needed for nausea or vomiting. Patient not taking: Reported on 01/18/2015 11/23/14   Robyn Haber, MD    Allergies:  Allergies  Allergen Reactions  . Effexor [Venlafaxine]     Multiple somatic issues  . Latex   . Lexapro [Escitalopram Oxalate]     Mania   . Paxil [Paroxetine Hcl]     Feels detached   . Penicillins   . Sulfa Antibiotics   . Wellbutrin [Bupropion]     Became belligerant  . Zoloft [Sertraline Hcl]     rash  . Differin [Adapalene] Rash    Social History   Social History  . Marital Status: Married    Spouse Name: N/A  . Number of Children: N/A  . Years of Education: N/A   Occupational History  . nusre  Other   Social History Main Topics  . Smoking status: Current Every Day Smoker -- 1.00 packs/day    Types: Cigarettes  . Smokeless tobacco: Never Used  . Alcohol Use: 0.6 oz/week    1 Glasses of wine per week     Comment: rarely  . Drug Use: No  . Sexual Activity: Yes    Birth Control/ Protection: IUD   Other Topics Concern  . Not on file   Social History Narrative     Review of Systems: Constitutional: negative for chills, fever, night sweats, weight changes, or fatigue  HEENT: negative for vision changes, hearing loss, congestion, rhinorrhea, ST, epistaxis, or sinus pressure Cardiovascular: negative for palpitations; positive for chest pain Respiratory: negative for hemoptysis, wheezing, shortness of breath, or cough Abdominal: negative for abdominal pain, nausea, vomiting, diarrhea, or constipation Dermatological: negative for rash Neurologic: negative for headache, or syncope; positive for lightheadedness, dizziness All other systems reviewed and are otherwise negative with the exception to those above and in the  HPI.  Physical Exam:  Patient is tearful and quite worried about was Blood pressure 110/66, pulse 83, temperature 98.2 F (36.8 C), temperature source Oral, resp. rate 18, height $RemoveBe'5\' 7"'[id number]$  (1.702 m), weight 163 lb (73.936 kg), SpO2 99 %., Body mass index is 25.52 kg/(m^2). General: Well developed, well nourished, in no acute distress. Head: Normocephalic, atraumatic, eyes without discharge, sclera non-icteric, nares are without discharge. Bilateral auditory canals clear, TM's are without perforation, pearly grey and translucent with reflective cone of light bilaterally. Oral cavity moist, posterior pharynx without exudate, erythema, peritonsillar abscess, or post nasal drip.  Neck: Supple. No thyromegaly. Full ROM. No lymphadenopathy. Lungs: Clear bilaterally to auscultation without wheezes, rales, or rhonchi. Breathing is unlabored. Heart: RRR with S1 S2. No murmurs, rubs, or gallops appreciated. Abdomen:  Soft, non-tender, non-distended with normoactive bowel sounds. No hepatomegaly. No rebound/guarding. No obvious abdominal masses. Msk:  Strength and tone normal for age. Extremities/Skin: Warm and dry. No clubbing or cyanosis. No edema. No rashes or suspicious lesions. Neuro: Alert and oriented X 3. Moves all extremities spontaneously. Gait is normal. CNII-XII grossly in tact. Psych:  Responds to questions appropriately with a normal affect.   BP recheck in room (sitting) : 90/60 which does not change when standing  Labs: Results for orders placed or performed during the hospital encounter of 01/13/15  CBC with Differential  Result Value Ref Range   WBC 12.2 (H) 4.0 - 10.5 K/uL   RBC 4.31 3.87 - 5.11 MIL/uL   Hemoglobin 13.6 12.0 - 15.0 g/dL   HCT 40.1 36.0 - 46.0 %   MCV 93.0 78.0 - 100.0 fL   MCH 31.6 26.0 - 34.0 pg   MCHC 33.9 30.0 - 36.0 g/dL   RDW 12.3 11.5 - 15.5 %   Platelets 277 150 - 400 K/uL   Neutrophils Relative % 63 43 - 77 %   Neutro Abs 7.6 1.7 - 7.7 K/uL   Lymphocytes  Relative 28 12 - 46 %   Lymphs Abs 3.5 0.7 - 4.0 K/uL   Monocytes Relative 8 3 - 12 %   Monocytes Absolute 1.0 0.1 - 1.0 K/uL   Eosinophils Relative 1 0 - 5 %   Eosinophils Absolute 0.2 0.0 - 0.7 K/uL   Basophils Relative 0 0 - 1 %   Basophils Absolute 0.0 0.0 - 0.1 K/uL  Basic metabolic panel  Result Value Ref Range   Sodium 140 135 - 145 mmol/L   Potassium 3.6 3.5 - 5.1 mmol/L   Chloride 108 101 - 111 mmol/L   CO2 26 22 - 32 mmol/L   Glucose, Bld 96 65 - 99 mg/dL   BUN 11 6 - 20 mg/dL   Creatinine, Ser 0.63 0.44 - 1.00 mg/dL   Calcium 8.9 8.9 - 10.3 mg/dL   GFR calc non Af Amer >60 >60 mL/min   GFR calc Af Amer >60 >60 mL/min   Anion gap 6 5 - 15  Troponin I  Result Value Ref Range   Troponin I <0.03 <0.031 ng/mL  TSH  Result Value Ref Range   TSH 1.312 0.350 - 4.500 uIU/mL    ASSESSMENT AND PLAN:  34 y.o. year old female with chronic intermittent chest pain which has gotten worse and is become associated with palpitations and shortness of breath. Her workup so far is been negative but she has had this during her pregnancy and it seemed to be helped by taking diltiazem. This chart was scribed in my presence and reviewed by me personally.    ICD-9-CM ICD-10-CM   1. Other chest pain 786.59 R07.89 diltiazem (CARDIZEM CD) 120 MG 24 hr capsule     Ambulatory referral to Cardiology  2. Palpitations 785.1 R00.2 diltiazem (CARDIZEM CD) 120 MG 24 hr capsule     Ambulatory referral to Cardiology  3. Shortness of breath 786.05 R06.02 diltiazem (CARDIZEM CD) 120 MG 24 hr capsule     Ambulatory referral to Cardiology     Signed, Robyn Haber, MD   Signed, Robyn Haber, MD 01/18/2015 4:29 PM

## 2015-01-18 NOTE — Patient Instructions (Signed)
We are scheduling a cardiology consult to be done this week. In the meantime I will you to start on a diltiazem medicine at bedtime once a day.

## 2015-01-27 NOTE — Progress Notes (Signed)
This encounter was created in error - please disregard.

## 2015-01-28 ENCOUNTER — Encounter: Payer: 59 | Admitting: Cardiovascular Disease

## 2015-02-04 ENCOUNTER — Other Ambulatory Visit: Payer: Self-pay | Admitting: Family Medicine

## 2015-03-30 ENCOUNTER — Ambulatory Visit (INDEPENDENT_AMBULATORY_CARE_PROVIDER_SITE_OTHER): Payer: 59 | Admitting: Family Medicine

## 2015-03-30 VITALS — BP 110/68 | HR 89 | Temp 98.1°F | Resp 18 | Ht 68.0 in | Wt 165.4 lb

## 2015-03-30 DIAGNOSIS — J0100 Acute maxillary sinusitis, unspecified: Secondary | ICD-10-CM | POA: Diagnosis not present

## 2015-03-30 DIAGNOSIS — L739 Follicular disorder, unspecified: Secondary | ICD-10-CM

## 2015-03-30 MED ORDER — AZITHROMYCIN 250 MG PO TABS
ORAL_TABLET | ORAL | Status: DC
Start: 2015-03-30 — End: 2015-04-20

## 2015-03-30 MED ORDER — FLUCONAZOLE 150 MG PO TABS: 150.0000 mg | ORAL_TABLET | Freq: Once | ORAL | Status: DC

## 2015-03-30 MED ORDER — MUPIROCIN 2 % EX OINT: TOPICAL_OINTMENT | CUTANEOUS | Status: DC

## 2015-03-30 MED ORDER — AZELASTINE HCL 0.15 % NA SOLN: 2.0000 | Freq: Every day | NASAL | Status: DC

## 2015-03-30 NOTE — Progress Notes (Signed)
Chief Complaint:  Chief Complaint  Patient presents with  . Vaginal Discharge    today  . Sinusitis  . Cough    HPI: Abigail White is a 34 y.o. female who reports to [facility] today complaining of   Ingrown hair on pubic area after waxing for 1 week, she has had dc and has ahd pain, she has some other spots whoch she has pulled the ingrown hair put and they are fine.  No fevers or chills.  She has sinus issues and has had prior sinus surgery, not feeling well even after netty pot. She has not tried steroids NS. She took astelin and helped for several hours.  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 She states curretly no CP or palpitations, feels it is more anxiety and GERD related.  No SOB, n/w/t. No abd pain ,nausea/vomiting.   Past Medical History  Diagnosis Date  . Palpitations     H/O  . Dyspnea     H/O  . Chest pain     H/O  . Anxiety     H/O  . Depression   . Allergy   . Migraine   . Chronic fatigue   . PTSD (post-traumatic stress disorder)   . ADHD (attention deficit hyperactivity disorder)   . Carpal tunnel syndrome    History reviewed. No pertinent past surgical history. Social History   Social History  . Marital Status: Married    Spouse Name: N/A  . Number of Children: N/A  . Years of Education: N/A   Occupational History  . n[employer]  Other   Social History Main Topics  . Smoking status: Current Every Day Smoker -- 1.00 packs/day    Types: Cigarettes  . Smokeless tobacco: Never Used  . Alcohol Use: 0.6 oz/week    1 Glasses of wine per week     Comment: rarely  . Drug Use: No  . Sexual Activity: Yes   Birth Control/ Protection: IUD   Other Topics Concern  . None   Social History Narrative   Family History  Problem Relation Age of Onset  . Coronary artery disease Mother 67  . Hypertension Mother   . Diabetes Mother   . Heart disease Mother   . Hypertension Other   . Diabetes Other   . Other Father     Shot-violence  . Hypertension Brother   . Hypertension Brother   . Cancer Maternal Grandfather   . Cancer Paternal Grandmother   . Aneurysm Paternal Grandfather    Allergies  Allergen Reactions  . Effexor [Venlafaxine]     Multiple somatic issues  . Latex   . Lexapro [Escitalopram Oxalate]     Mania   . Paxil [Paroxetine Hcl]     Feels detached   . Penicillins   . Sulfa Antibiotics   . Wellbutrin [Bupropion]     Became belligerant  . Zoloft [Sertraline Hcl]     rash  . Differin [Adapalene] Rash   Prior to Admission medications   Medication Sig Start Date End Date Taking? Authorizing Provider  albuterol (  PROVENTIL HFA;VENTOLIN HFA) 108 (90 BASE) MCG/ACT inhaler Inhale 2 puffs into the lungs every 6 (six) hours as needed for wheezing or shortness of breath (cough, shortness of breath or wheezing.). Patient not taking: Reported on 01/18/2015 07/14/14   Jaynee Eagles, PA-C  albuterol (PROVENTIL) (2.5 MG/3ML) 0.083% nebulizer solution Take 3 mLs (2.5 mg total) by nebulization every 6 (six) hours as needed for wheezing or shortness of breath. Patient not taking: Reported on 01/18/2015 07/14/14   Jaynee Eagles, PA-C  ALPRAZolam Duanne Moron) 0.5 MG tablet Take 1 tablet (0.5 mg total) by mouth at bedtime as needed. for sleep 11/23/14   Robyn Haber, MD  amphetamine-dextroamphetamine (ADDERALL) 5 MG tablet Take 1 tablet (5 mg total) by mouth 2 (two) times daily. Fill after January 20, 2015 11/23/14   Robyn Haber, MD  butalbital-acetaminophen-caffeine (FIORICET) [phone] MG per tablet Take 1-2 tablets by mouth every 6 (six) hours as needed for headache. Patient not taking: Reported on  01/18/2015 05/15/14 05/15/15  Robyn Haber, MD  cyclobenzaprine (FLEXERIL) 10 MG tablet Take 1 tablet (10 mg total) by mouth 3 (three) times daily as needed for muscle spasms. Patient not taking: Reported on 01/18/2015 02/01/13   Leandrew Koyanagi, MD  diltiazem (CARDIZEM CD) 120 MG 24 hr capsule Take 1 capsule (120 mg total) by mouth at bedtime. 01/18/15   Robyn Haber, MD  hyoscyamine (LEVSIN/SL) 0.125 MG SL tablet Place 1 tablet (0.125 mg total) under the tongue every 4 (four) hours as needed. 11/23/14   Robyn Haber, MD  ibuprofen (ADVIL,MOTRIN) 800 MG tablet TAKE 1 TABLET BY MOUTH EVERY 8 HOURS AS NEEDED 02/05/15   Robyn Haber, MD  L-Methylfolate (DEPLIN) 15 MG TABS Take by mouth.    Historical Provider, MD  Lidocaine-Hydrocortisone Ace (ANAMANTLE HC) 3-2.5 % KIT Place 1 application rectally 2 (two) times daily. Patient not taking: Reported on 01/18/2015 02/01/13   Leandrew Koyanagi, MD  ondansetron (ZOFRAN ODT) 8 MG disintegrating tablet Take 1 tablet (8 mg total) by mouth every 8 (eight) hours as needed for nausea or vomiting. Patient not taking: Reported on 01/18/2015 11/23/14   Robyn Haber, MD  traMADol (ULTRAM) 50 MG tablet Take 1 tablet (50 mg total) by mouth every 6 (six) hours as needed. 12/29/14   Orpah Greek, MD     ROS: The patient denies fevers, chills, night sweats, unintentional weight loss, chest pain, palpitations, wheezing, dyspnea on exertion, nausea, vomiting, abdominal pain, dysuria, hematuria, melena, numbness, weakness, or tingling.   All other systems have been reviewed and were otherwise negative with the exception of those mentioned in the HPI and as above.    PHYSICAL EXAM: Filed Vitals:   03/30/15 1855  BP: 110/68  Pulse: 89  Temp: 98.1 F (36.7 C)  Resp: 18   Body mass index is 25.15 kg/(m^2).   General: Alert, no acute distress HEENT:  Normocephalic, atraumatic, oropharynx patent. EOMI, PERRLA Erythematous throat, no exudates, TM  normal, + sinus tenderness, + erythematous/boggy nasal mucosa Cardiovascular:  Regular rate and rhythm, no rubs murmurs or gallops.  No Carotid bruits, radial pulse intact. No pedal edema.  Respiratory: Clear to auscultation bilaterally.  No wheezes, rales, or rhonchi.  No cyanosis, no use of accessory musculature Abdominal: No organomegaly, abdomen is soft and non-tender, positive bowel sounds. No masses. Skin: + folliculitis, no abscess Neurologic: Facial musculature symmetric. Psychiatric: Patient acts appropriately throughout our interaction. Lymphatic: No cervical or submandibular lymphadenopathy Musculoskeletal: Gait intact. No edema, tenderness   LABS: Results for orders  placed or performed during the hospital encounter of 01/13/15  CBC with Differential  Result Value Ref Range   WBC 12.2 (H) 4.0 - 10.5 K/uL   RBC 4.31 3.87 - 5.11 MIL/uL   Hemoglobin 13.6 12.0 - 15.0 g/dL   HCT 40.1 36.0 - 46.0 %   MCV 93.0 78.0 - 100.0 fL   MCH 31.6 26.0 - 34.0 pg   MCHC 33.9 30.0 - 36.0 g/dL   RDW 12.3 11.5 - 15.5 %   Platelets 277 150 - 400 K/uL   Neutrophils Relative % 63 43 - 77 %   Neutro Abs 7.6 1.7 - 7.7 K/uL   Lymphocytes Relative 28 12 - 46 %   Lymphs Abs 3.5 0.7 - 4.0 K/uL   Monocytes Relative 8 3 - 12 %   Monocytes Absolute 1.0 0.1 - 1.0 K/uL   Eosinophils Relative 1 0 - 5 %   Eosinophils Absolute 0.2 0.0 - 0.7 K/uL   Basophils Relative 0 0 - 1 %   Basophils Absolute 0.0 0.0 - 0.1 K/uL  Basic metabolic panel  Result Value Ref Range   Sodium 140 135 - 145 mmol/L   Potassium 3.6 3.5 - 5.1 mmol/L   Chloride 108 101 - 111 mmol/L   CO2 26 22 - 32 mmol/L   Glucose, Bld 96 65 - 99 mg/dL   BUN 11 6 - 20 mg/dL   Creatinine, Ser 0.63 0.44 - 1.00 mg/dL   Calcium 8.9 8.9 - 10.3 mg/dL   GFR calc non Af Amer >60 >60 mL/min   GFR calc Af Amer >60 >60 mL/min   Anion gap 6 5 - 15  Troponin I  Result Value Ref Range   Troponin I <0.03 <0.031 ng/mL  TSH  Result Value Ref Range    TSH 1.312 0.350 - 4.500 uIU/mL     EKG/XRAY:   Primary read interpreted by Dr. Marin Comment at [facility]   ASSESSMENT/PLAN: Encounter Diagnoses  Name Primary?  . Acute maxillary sinusitis, recurrence not specified   . Folliculitis Yes   Rx bactroban prn Rx azithromycin Rx Astelin She declines referral to cardiology Dr Einar Gip  at this time since asymptomatic.   Gross sideeffects, risk and benefits, and alternatives of medications d/w patient. Patient is aware that all medications have potential sideeffects and we are unable to predict every sideeffect or drug-drug interaction that may occur.  Shantay Sonn DO  03/31/2015 10:55 AM

## 2015-04-20 ENCOUNTER — Ambulatory Visit (INDEPENDENT_AMBULATORY_CARE_PROVIDER_SITE_OTHER): Payer: 59 | Admitting: Internal Medicine

## 2015-04-20 VITALS — BP 102/64 | HR 95 | Temp 98.0°F | Resp 18 | Ht 68.0 in | Wt 168.0 lb

## 2015-04-20 DIAGNOSIS — S1093XA Contusion of unspecified part of neck, initial encounter: Secondary | ICD-10-CM | POA: Diagnosis not present

## 2015-04-20 DIAGNOSIS — R1310 Dysphagia, unspecified: Secondary | ICD-10-CM | POA: Diagnosis not present

## 2015-04-20 DIAGNOSIS — Z09 Encounter for follow-up examination after completed treatment for conditions other than malignant neoplasm: Secondary | ICD-10-CM

## 2015-04-20 DIAGNOSIS — M791 Myalgia: Secondary | ICD-10-CM | POA: Diagnosis not present

## 2015-04-20 DIAGNOSIS — S161XXA Strain of muscle, fascia and tendon at neck level, initial encounter: Secondary | ICD-10-CM | POA: Diagnosis not present

## 2015-04-20 DIAGNOSIS — M609 Myositis, unspecified: Secondary | ICD-10-CM

## 2015-04-20 DIAGNOSIS — T7491XA Unspecified adult maltreatment, confirmed, initial encounter: Secondary | ICD-10-CM | POA: Diagnosis not present

## 2015-04-20 MED ORDER — CYCLOBENZAPRINE HCL 10 MG PO TABS: 10.0000 mg | ORAL_TABLET | Freq: Three times a day (TID) | ORAL | Status: DC | PRN

## 2015-04-20 MED ORDER — MELOXICAM 15 MG PO TABS: 15.0000 mg | ORAL_TABLET | Freq: Every day | ORAL | Status: DC
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
## 2015-04-28 IMAGING — CR DG CHEST 2V
2 series · 2 of 2 positions shown · non-contrast
Comparison: 05/22/2013.

CLINICAL DATA: Chest tightness. Initial encounter. Chest pain.
Cough.

EXAM:
CHEST  2 VIEW

[PA]
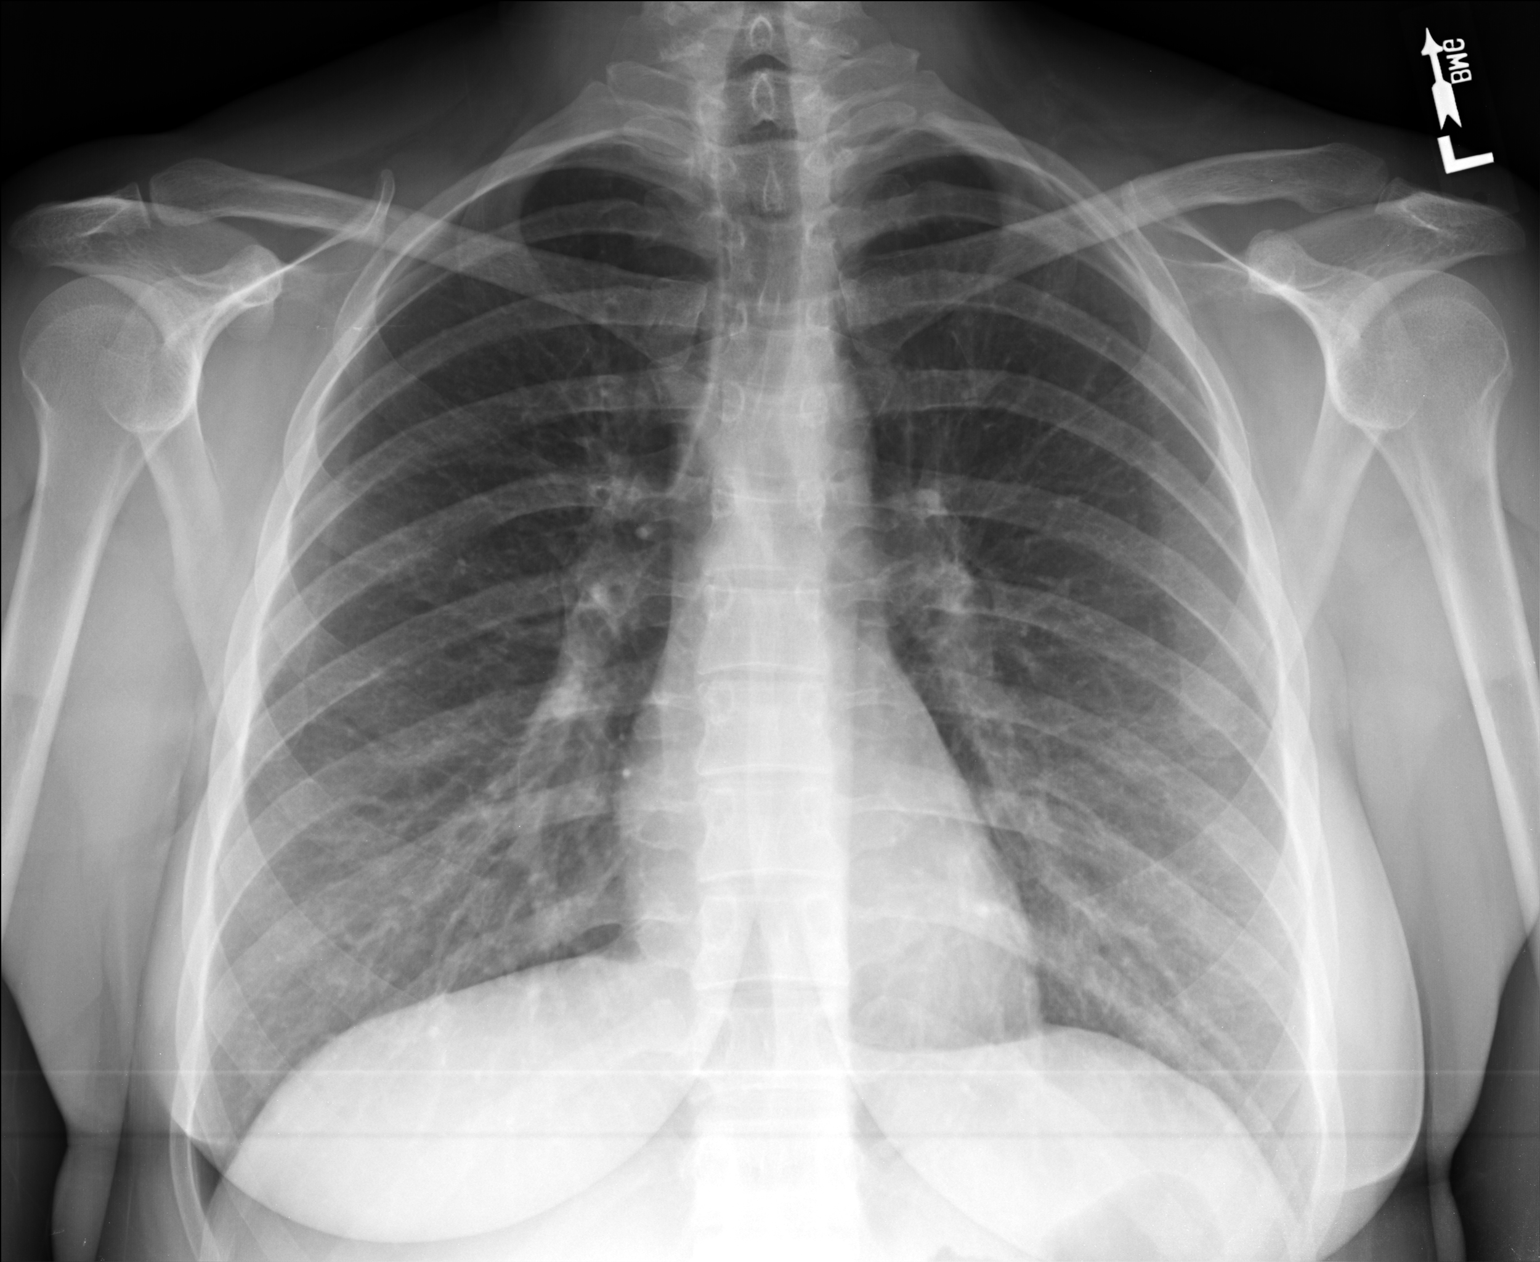

[lateral]
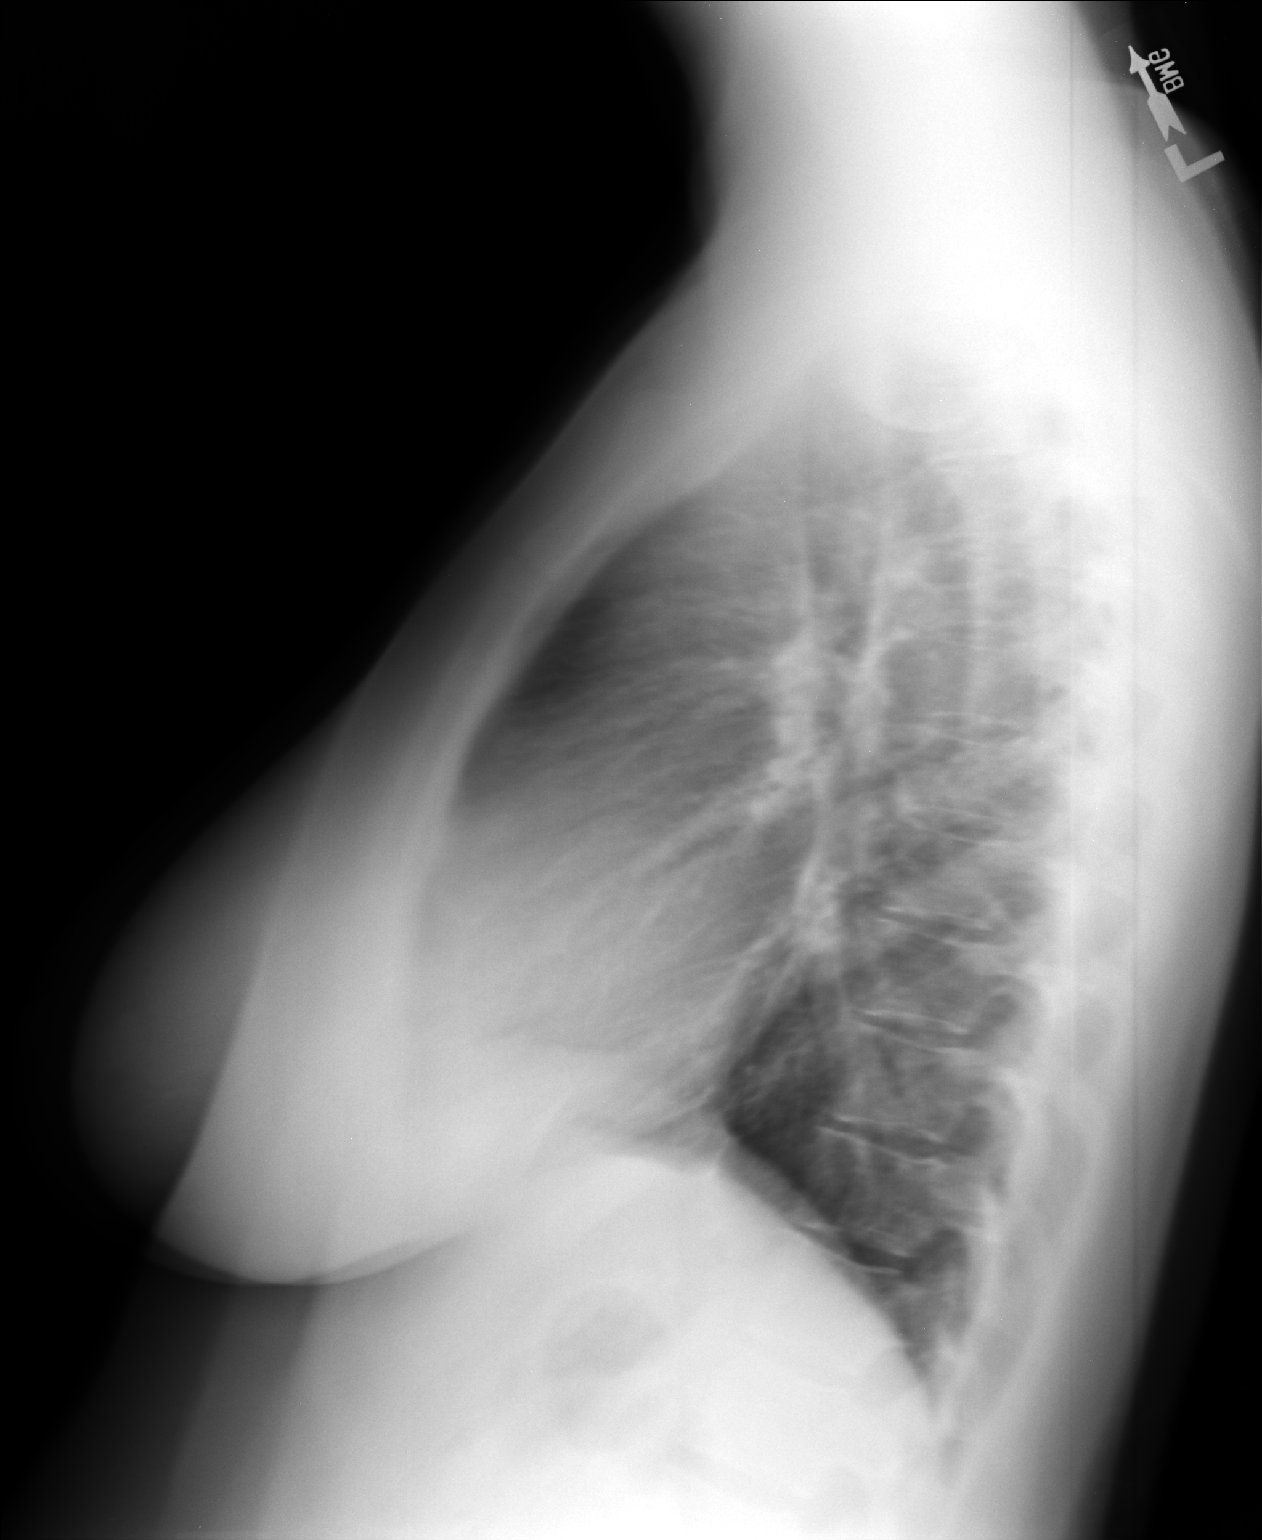

[2 of 2 positions shown; findings below may reference images not displayed]

FINDINGS: Cardiopericardial silhouette within normal limits. Mediastinal
contours normal. Trachea midline. No airspace disease or effusion.
Respiratory motion artifact is present on the lateral view.

Specifically, the lung bases appear within normal limits. No
consolidation or pneumonia. Apparent increased density on the
frontal projection is due to overlapping soft tissues.
IMPRESSION: No active cardiopulmonary disease.

## 2015-05-24 LAB — OB RESULTS CONSOLE GBS: GBS: NEGATIVE

## 2015-08-06 ENCOUNTER — Other Ambulatory Visit: Payer: Self-pay | Admitting: Internal Medicine

## 2015-08-09 ENCOUNTER — Other Ambulatory Visit: Payer: Self-pay | Admitting: Family Medicine

## 2015-08-09 NOTE — Telephone Encounter (Signed)
faxed

## 2015-09-14 ENCOUNTER — Other Ambulatory Visit: Payer: Self-pay | Admitting: Urgent Care

## 2015-10-13 IMAGING — DX DG FOOT COMPLETE 3+V*R*
3 series · 3 of 3 positions shown · non-contrast
Comparison: None.

CLINICAL DATA: Lots of pots/pans fell off the shelf at [employer] an
hour ago landing on her right foot; pain is lateral mid foot that
extends posteriorly and up the back of her heel

EXAM:
RIGHT FOOT COMPLETE - 3+ VIEW

[foot ap]
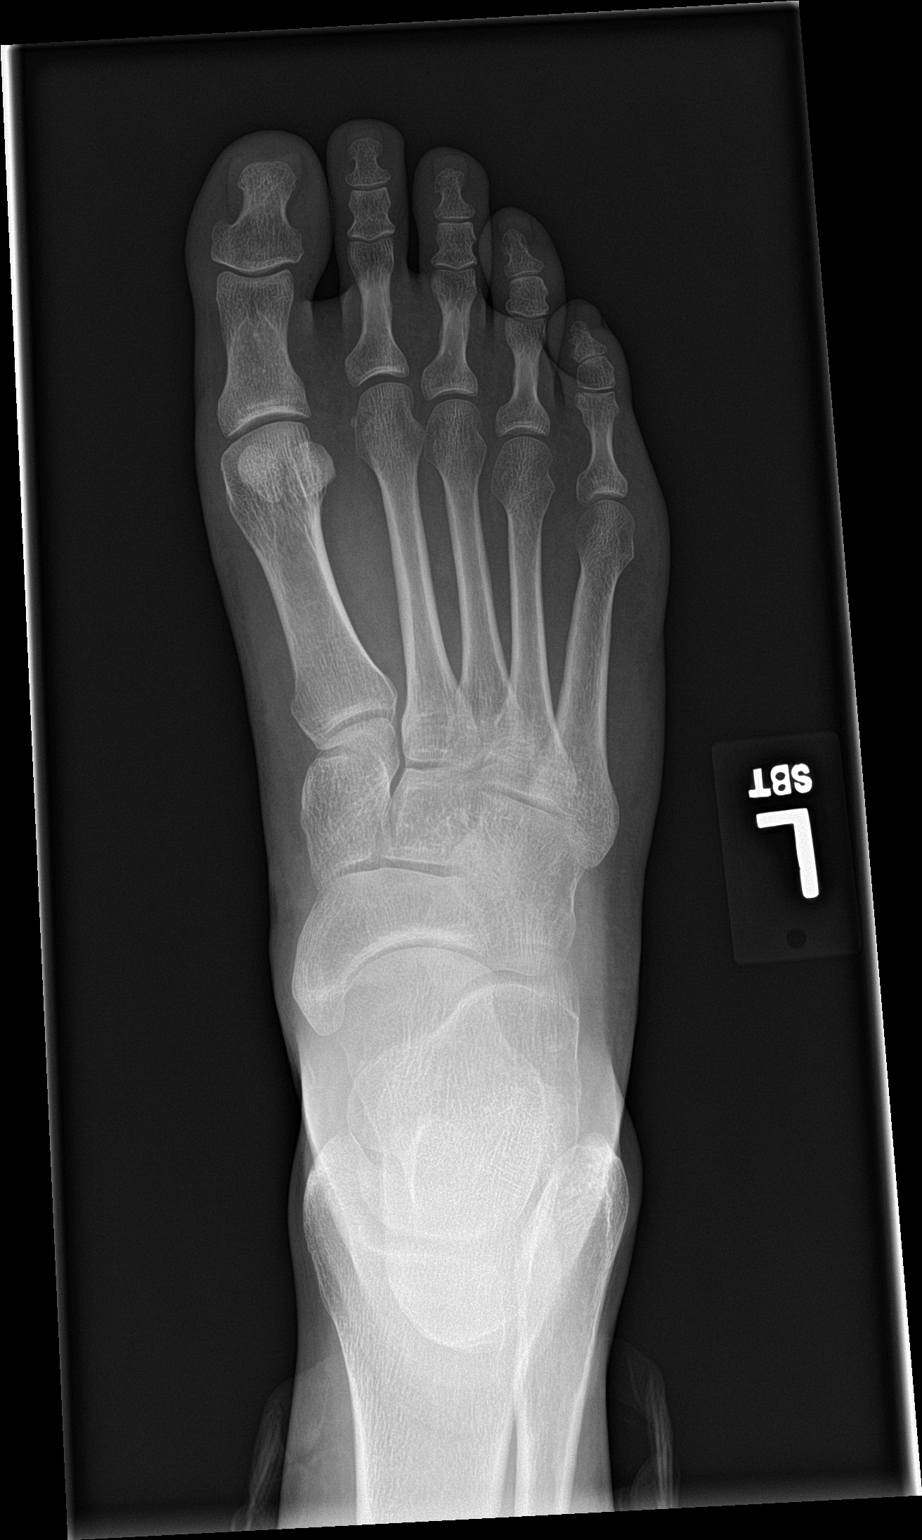

[foot obl]
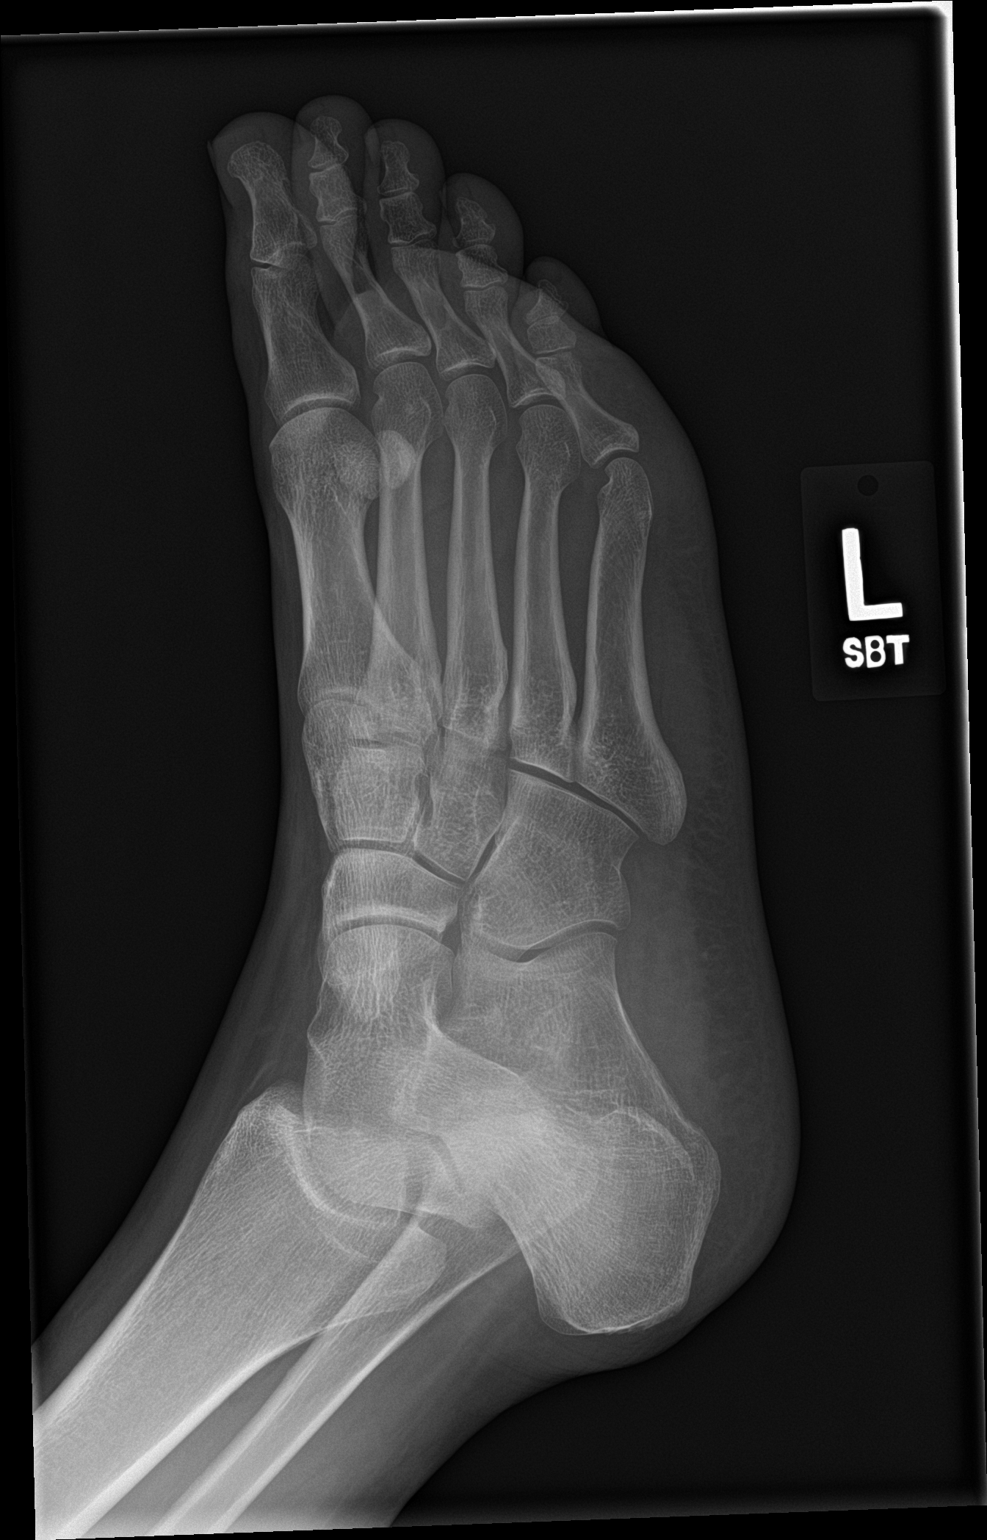

[foot lat]
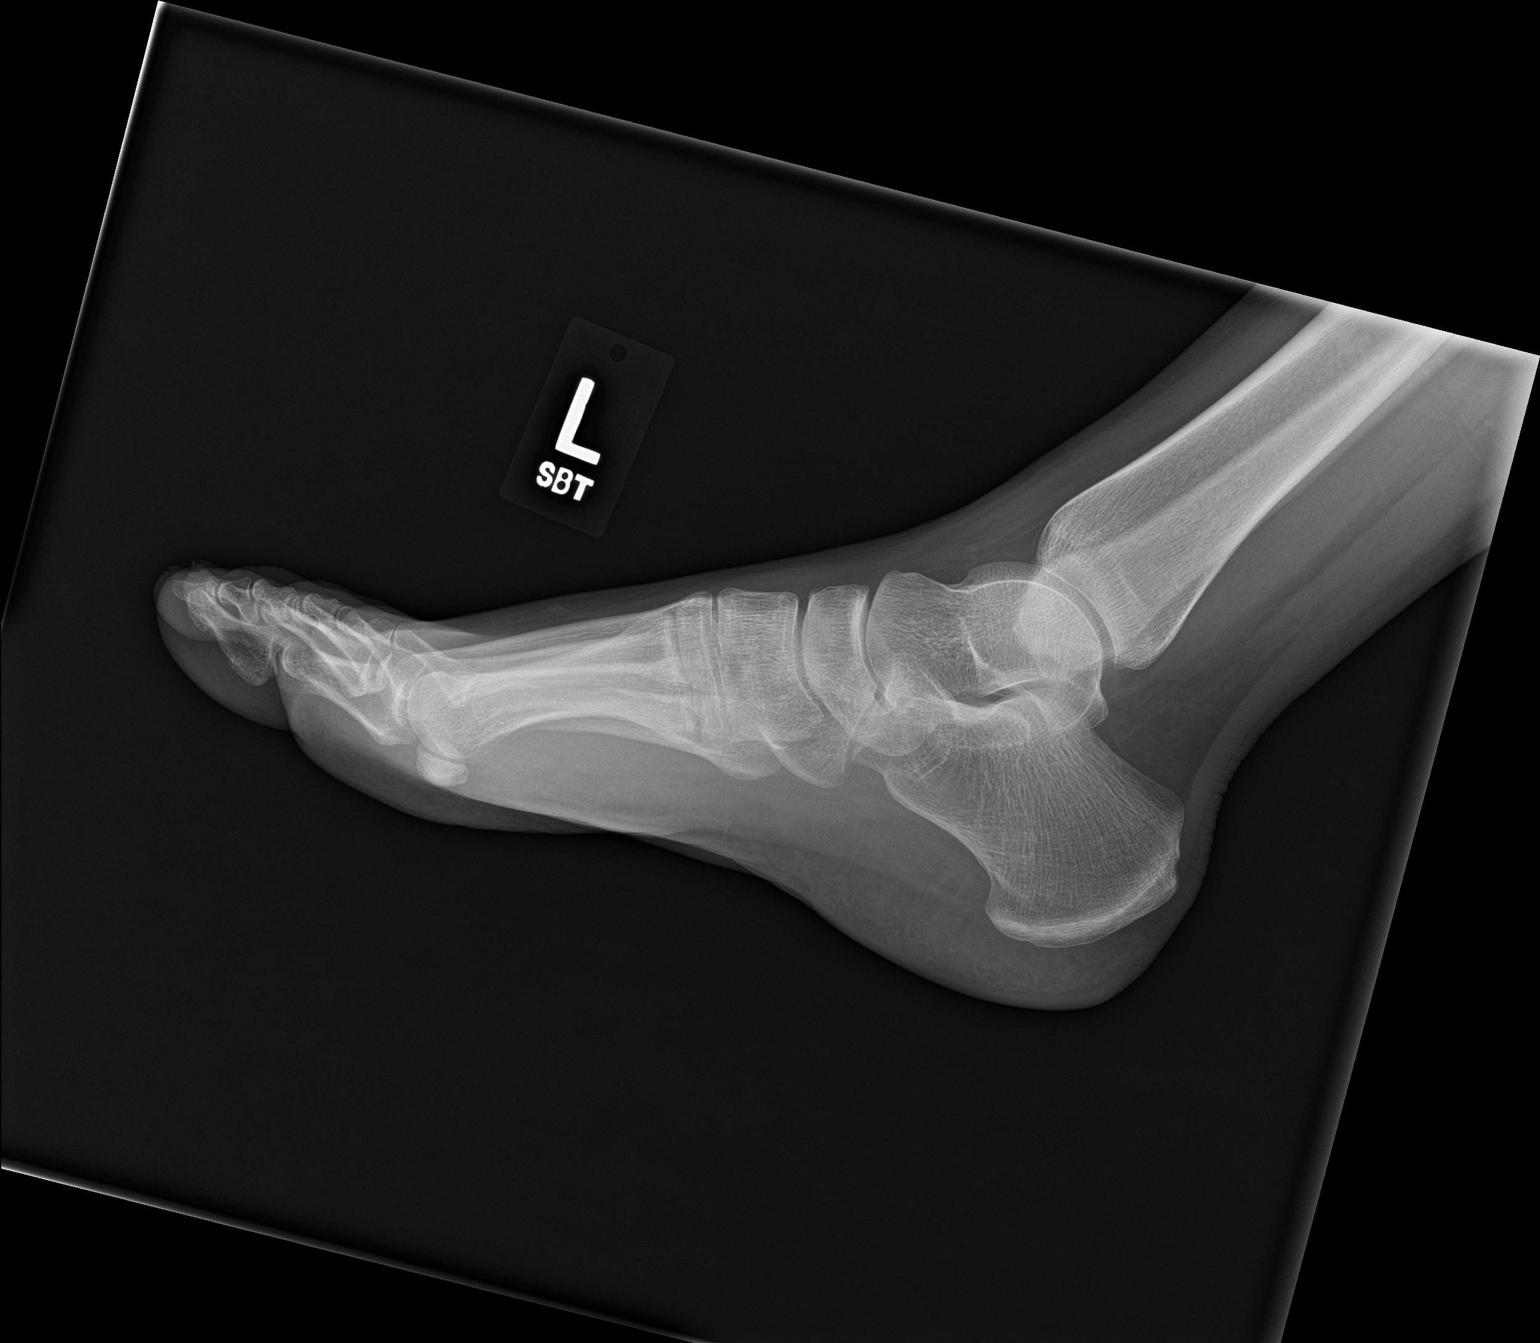

[3 of 3 positions shown; findings below may reference images not displayed]

FINDINGS: There is no evidence of fracture or dislocation. There is no
evidence of arthropathy or other focal bone abnormality. Soft
tissues are unremarkable.
IMPRESSION: Negative.

## 2015-11-18 LAB — OB RESULTS CONSOLE ANTIBODY SCREEN: Antibody Screen: NEGATIVE

## 2015-11-18 LAB — OB RESULTS CONSOLE RPR: RPR: NONREACTIVE

## 2015-11-18 LAB — OB RESULTS CONSOLE ABO/RH: RH Type: NEGATIVE

## 2015-11-18 LAB — OB RESULTS CONSOLE HIV ANTIBODY (ROUTINE TESTING): HIV: NONREACTIVE

## 2015-11-18 LAB — OB RESULTS CONSOLE RUBELLA ANTIBODY, IGM: Rubella: IMMUNE

## 2015-11-18 LAB — OB RESULTS CONSOLE HEPATITIS B SURFACE ANTIGEN: Hepatitis B Surface Ag: NEGATIVE

## 2015-11-18 LAB — OB RESULTS CONSOLE GC/CHLAMYDIA
Chlamydia: NEGATIVE
Gonorrhea: NEGATIVE

## 2016-04-17 ENCOUNTER — Encounter: Payer: 59 | Attending: Obstetrics and Gynecology | Admitting: Skilled Nursing Facility1

## 2016-04-17 ENCOUNTER — Encounter: Payer: Self-pay | Admitting: Skilled Nursing Facility1

## 2016-04-17 DIAGNOSIS — Z713 Dietary counseling and surveillance: Secondary | ICD-10-CM | POA: Diagnosis not present

## 2016-04-17 DIAGNOSIS — O99810 Abnormal glucose complicating pregnancy: Secondary | ICD-10-CM

## 2016-04-17 DIAGNOSIS — Z3A00 Weeks of gestation of pregnancy not specified: Secondary | ICD-10-CM | POA: Diagnosis not present

## 2016-04-17 NOTE — Progress Notes (Signed)
  Patient was seen on 04/17/2016 for Gestational Diabetes self-management class at [facility]. The following learning objectives were met by the patient during this course:   States the definition of Gestational Diabetes  States why dietary management is important in controlling blood glucose  Describes the effects each nutrient has on blood glucose levels  Demonstrates ability to create a balanced meal plan  Demonstrates carbohydrate counting   States when to check blood glucose levels involving a total of 4 separate occurences in a day  Demonstrates proper blood glucose monitoring techniques  States the effect of stress and exercise on blood glucose levels  States the importance of limiting caffeine and abstaining from alcohol and smoking  Demonstrates the knowledge the glucometer provided in class may not be covered by their insurance and to call their insurance provider immediately after class to know which glucometer their insurance provider does cover as well as calling their physician the next day for a prescription to the glucometer their insurance does cover (if the one provided is not) as well as the lancets and strips for that meter.  Blood glucose monitor given: pt already had her own Blood glucose reading: 92  Patient instructed to monitor glucose levels: FBS: 60 - <90 1 hour: <140 2 hour: <120  *Patient received handouts:  Nutrition Diabetes and Pregnancy  Carbohydrate Counting List  Patient will be seen for follow-up as needed.

## 2016-05-08 NOTE — L&D Delivery Note (Signed)
Delivery Note At 6:12 PM a viable and healthy female was delivered via  (Presentation: LOA ).  APGAR: 8,9 ; weight  pending.   Placenta status: spontaneous, intact.  Cord:  with the following complications: none.  Cord pH: na  Anesthesia:  epidural Episiotomy:  done Lacerations:  none Suture Repair: na Est. Blood Loss (mL):  250  Mom to postpartum.  Baby to Couplet care / Skin to Skin.  Willy Vorce J 06/20/2016, 6:28 PM

## 2016-05-11 DIAGNOSIS — R42 Dizziness and giddiness: Secondary | ICD-10-CM | POA: Diagnosis not present

## 2016-05-11 DIAGNOSIS — R51 Headache: Secondary | ICD-10-CM | POA: Diagnosis not present

## 2016-05-23 LAB — OB RESULTS CONSOLE GBS: GBS: NEGATIVE

## 2016-06-07 ENCOUNTER — Telehealth (HOSPITAL_COMMUNITY): Payer: Self-pay | Admitting: *Deleted

## 2016-06-07 ENCOUNTER — Encounter (HOSPITAL_COMMUNITY): Payer: Self-pay | Admitting: *Deleted

## 2016-06-07 ENCOUNTER — Other Ambulatory Visit: Payer: Self-pay | Admitting: Obstetrics and Gynecology

## 2016-06-20 ENCOUNTER — Inpatient Hospital Stay (HOSPITAL_COMMUNITY)
Admission: RE | Admit: 2016-06-20 | Discharge: 2016-06-22 | DRG: 774 | Disposition: A | Discharge status: Home / Self Care | Payer: 59 | Source: Ambulatory Visit | Attending: Obstetrics and Gynecology | Admitting: Obstetrics and Gynecology

## 2016-06-20 ENCOUNTER — Inpatient Hospital Stay (HOSPITAL_COMMUNITY): Payer: 59 | Admitting: Anesthesiology

## 2016-06-20 ENCOUNTER — Encounter (HOSPITAL_COMMUNITY): Payer: Self-pay

## 2016-06-20 DIAGNOSIS — Z3A39 39 weeks gestation of pregnancy: Secondary | ICD-10-CM | POA: Diagnosis not present

## 2016-06-20 DIAGNOSIS — A6000 Herpesviral infection of urogenital system, unspecified: Secondary | ICD-10-CM | POA: Diagnosis present

## 2016-06-20 DIAGNOSIS — Z8249 Family history of ischemic heart disease and other diseases of the circulatory system: Secondary | ICD-10-CM

## 2016-06-20 DIAGNOSIS — O9832 Other infections with a predominantly sexual mode of transmission complicating childbirth: Secondary | ICD-10-CM | POA: Diagnosis present

## 2016-06-20 DIAGNOSIS — Z833 Family history of diabetes mellitus: Secondary | ICD-10-CM

## 2016-06-20 DIAGNOSIS — O24429 Gestational diabetes mellitus in childbirth, unspecified control: Secondary | ICD-10-CM | POA: Diagnosis not present

## 2016-06-20 DIAGNOSIS — O24420 Gestational diabetes mellitus in childbirth, diet controlled: Secondary | ICD-10-CM | POA: Diagnosis not present

## 2016-06-20 DIAGNOSIS — Z823 Family history of stroke: Secondary | ICD-10-CM

## 2016-06-20 DIAGNOSIS — F17210 Nicotine dependence, cigarettes, uncomplicated: Secondary | ICD-10-CM | POA: Diagnosis present

## 2016-06-20 DIAGNOSIS — O99334 Smoking (tobacco) complicating childbirth: Secondary | ICD-10-CM | POA: Diagnosis present

## 2016-06-20 LAB — CBC
HCT: 35 % — ABNORMAL LOW (ref 36.0–46.0)
Hemoglobin: 12.4 g/dL (ref 12.0–15.0)
MCH: 32.4 pg (ref 26.0–34.0)
MCHC: 35.4 g/dL (ref 30.0–36.0)
MCV: 91.4 fL (ref 78.0–100.0)
Platelets: 257 10*3/uL (ref 150–400)
RBC: 3.83 MIL/uL — ABNORMAL LOW (ref 3.87–5.11)
RDW: 13.5 % (ref 11.5–15.5)
WBC: 12.4 10*3/uL — ABNORMAL HIGH (ref 4.0–10.5)

## 2016-06-20 LAB — ABO/RH: ABO/RH(D): O NEG

## 2016-06-20 LAB — TYPE AND SCREEN
ABO/RH(D): O NEG
Antibody Screen: NEGATIVE

## 2016-06-20 LAB — GLUCOSE, CAPILLARY: Glucose-Capillary: 126 mg/dL — ABNORMAL HIGH (ref 65–99)

## 2016-06-20 LAB — GLUCOSE, RANDOM: Glucose, Bld: 115 mg/dL — ABNORMAL HIGH (ref 65–99)

## 2016-06-20 MED ORDER — OXYTOCIN 40 UNITS IN LACTATED RINGERS INFUSION - SIMPLE MED: 2.5000 [IU]/h | INTRAVENOUS | Status: DC

## 2016-06-20 MED ORDER — EPHEDRINE 5 MG/ML INJ
10.0000 mg | INTRAVENOUS | Status: DC | PRN
  Filled 2016-06-20: qty 4

## 2016-06-20 MED ORDER — LACTATED RINGERS IV SOLN: 500.0000 mL | INTRAVENOUS | Status: DC | PRN

## 2016-06-20 MED ORDER — OXYCODONE-ACETAMINOPHEN 5-325 MG PO TABS: 2.0000 | ORAL_TABLET | ORAL | Status: DC | PRN

## 2016-06-20 MED ORDER — ONDANSETRON HCL 4 MG/2ML IJ SOLN: 4.0000 mg | INTRAMUSCULAR | Status: DC | PRN

## 2016-06-20 MED ORDER — PHENYLEPHRINE 40 MCG/ML (10ML) SYRINGE FOR IV PUSH (FOR BLOOD PRESSURE SUPPORT)
80.0000 ug | PREFILLED_SYRINGE | INTRAVENOUS | Status: DC | PRN
  Filled 2016-06-20: qty 10
  Filled 2016-06-20: qty 5

## 2016-06-20 MED ORDER — ONDANSETRON HCL 4 MG/2ML IJ SOLN: 4.0000 mg | Freq: Four times a day (QID) | INTRAMUSCULAR | Status: DC | PRN

## 2016-06-20 MED ORDER — FENTANYL 2.5 MCG/ML BUPIVACAINE 1/10 % EPIDURAL INFUSION (WH - ANES)
14.0000 mL/h | INTRAMUSCULAR | Status: DC | PRN
  Administered 2016-06-20: 14 mL/h via EPIDURAL
  Filled 2016-06-20: qty 100

## 2016-06-20 MED ORDER — METHYLERGONOVINE MALEATE 0.2 MG PO TABS: 0.2000 mg | ORAL_TABLET | ORAL | Status: DC | PRN

## 2016-06-20 MED ORDER — SOD CITRATE-CITRIC ACID 500-334 MG/5ML PO SOLN
30.0000 mL | ORAL | Status: DC | PRN
  Administered 2016-06-20: 30 mL via ORAL
  Filled 2016-06-20: qty 15

## 2016-06-20 MED ORDER — COCONUT OIL OIL
1.0000 "application " | TOPICAL_OIL | Status: DC | PRN
  Administered 2016-06-21: 1 via TOPICAL
  Filled 2016-06-20: qty 120

## 2016-06-20 MED ORDER — OXYCODONE-ACETAMINOPHEN 5-325 MG PO TABS: 1.0000 | ORAL_TABLET | ORAL | Status: DC | PRN

## 2016-06-20 MED ORDER — WITCH HAZEL-GLYCERIN EX PADS: 1.0000 "application " | MEDICATED_PAD | CUTANEOUS | Status: DC | PRN

## 2016-06-20 MED ORDER — ONDANSETRON HCL 4 MG PO TABS: 4.0000 mg | ORAL_TABLET | ORAL | Status: DC | PRN

## 2016-06-20 MED ORDER — BENZOCAINE-MENTHOL 20-0.5 % EX AERO: 1.0000 "application " | INHALATION_SPRAY | CUTANEOUS | Status: DC | PRN

## 2016-06-20 MED ORDER — ACETAMINOPHEN 325 MG PO TABS
650.0000 mg | ORAL_TABLET | ORAL | Status: DC | PRN
  Administered 2016-06-21: 650 mg via ORAL
  Filled 2016-06-20: qty 2

## 2016-06-20 MED ORDER — LIDOCAINE HCL (PF) 1 % IJ SOLN
30.0000 mL | INTRAMUSCULAR | Status: DC | PRN
  Filled 2016-06-20: qty 30

## 2016-06-20 MED ORDER — DIPHENHYDRAMINE HCL 50 MG/ML IJ SOLN: 12.5000 mg | INTRAMUSCULAR | Status: DC | PRN

## 2016-06-20 MED ORDER — OXYCODONE-ACETAMINOPHEN 5-325 MG PO TABS
1.0000 | ORAL_TABLET | ORAL | Status: DC | PRN
  Administered 2016-06-21: 1 via ORAL
  Filled 2016-06-20: qty 1

## 2016-06-20 MED ORDER — OXYTOCIN 40 UNITS IN LACTATED RINGERS INFUSION - SIMPLE MED
1.0000 m[IU]/min | INTRAVENOUS | Status: DC
  Administered 2016-06-20: 2 m[IU]/min via INTRAVENOUS
  Filled 2016-06-20: qty 1000

## 2016-06-20 MED ORDER — LACTATED RINGERS IV SOLN
INTRAVENOUS | Status: DC
  Administered 2016-06-20 (×2): via INTRAVENOUS

## 2016-06-20 MED ORDER — ZOLPIDEM TARTRATE 5 MG PO TABS: 5.0000 mg | ORAL_TABLET | Freq: Every evening | ORAL | Status: DC | PRN

## 2016-06-20 MED ORDER — TETANUS-DIPHTH-ACELL PERTUSSIS 5-2.5-18.5 LF-MCG/0.5 IM SUSP: 0.5000 mL | Freq: Once | INTRAMUSCULAR | Status: DC

## 2016-06-20 MED ORDER — IBUPROFEN 600 MG PO TABS
600.0000 mg | ORAL_TABLET | Freq: Four times a day (QID) | ORAL | Status: DC
  Administered 2016-06-20 – 2016-06-22 (×7): 600 mg via ORAL
  Filled 2016-06-20 (×7): qty 1

## 2016-06-20 MED ORDER — OXYTOCIN BOLUS FROM INFUSION: 500.0000 mL | Freq: Once | INTRAVENOUS | Status: DC

## 2016-06-20 MED ORDER — TERBUTALINE SULFATE 1 MG/ML IJ SOLN
0.2500 mg | Freq: Once | INTRAMUSCULAR | Status: DC | PRN
  Filled 2016-06-20: qty 1

## 2016-06-20 MED ORDER — PHENYLEPHRINE 40 MCG/ML (10ML) SYRINGE FOR IV PUSH (FOR BLOOD PRESSURE SUPPORT)
80.0000 ug | PREFILLED_SYRINGE | INTRAVENOUS | Status: DC | PRN
  Filled 2016-06-20: qty 5

## 2016-06-20 MED ORDER — SENNOSIDES-DOCUSATE SODIUM 8.6-50 MG PO TABS
2.0000 | ORAL_TABLET | ORAL | Status: DC
  Administered 2016-06-20 – 2016-06-22 (×2): 2 via ORAL
  Filled 2016-06-20 (×2): qty 2

## 2016-06-20 MED ORDER — DIPHENHYDRAMINE HCL 25 MG PO CAPS: 25.0000 mg | ORAL_CAPSULE | Freq: Four times a day (QID) | ORAL | Status: DC | PRN

## 2016-06-20 MED ORDER — SIMETHICONE 80 MG PO CHEW: 80.0000 mg | CHEWABLE_TABLET | ORAL | Status: DC | PRN

## 2016-06-20 MED ORDER — PRENATAL MULTIVITAMIN CH
1.0000 | ORAL_TABLET | Freq: Every day | ORAL | Status: DC
  Administered 2016-06-21 – 2016-06-22 (×2): 1 via ORAL
  Filled 2016-06-20 (×2): qty 1

## 2016-06-20 MED ORDER — LACTATED RINGERS IV SOLN: 500.0000 mL | Freq: Once | INTRAVENOUS | Status: DC

## 2016-06-20 MED ORDER — DIBUCAINE 1 % RE OINT: 1.0000 "application " | TOPICAL_OINTMENT | RECTAL | Status: DC | PRN

## 2016-06-20 MED ORDER — ACETAMINOPHEN 325 MG PO TABS: 650.0000 mg | ORAL_TABLET | ORAL | Status: DC | PRN

## 2016-06-20 MED ORDER — METHYLERGONOVINE MALEATE 0.2 MG/ML IJ SOLN: 0.2000 mg | INTRAMUSCULAR | Status: DC | PRN

## 2016-06-20 MED ORDER — LIDOCAINE HCL (PF) 1 % IJ SOLN
INTRAMUSCULAR | Status: DC | PRN
  Administered 2016-06-20: 5 mL via EPIDURAL
  Administered 2016-06-20: 7 mL via EPIDURAL

## 2016-06-20 NOTE — Anesthesia Procedure Notes (Signed)
Epidural Patient location during procedure: OB Start time: 06/20/2016 11:25 AM End time: 06/20/2016 11:29 AM  Staffing Anesthesiologist: Leilani AbleHATCHETT, Carigan Lister Performed: anesthesiologist   Preanesthetic Checklist Completed: patient identified, surgical consent, pre-op evaluation, timeout performed, IV checked, risks and benefits discussed and monitors and equipment checked  Epidural Patient position: sitting Prep: site prepped and draped and DuraPrep Patient monitoring: continuous pulse ox and blood pressure Approach: midline Location: L3-L4 Injection technique: LOR air  Needle:  Needle type: Tuohy  Needle gauge: 17 G Needle length: 9 cm and 9 Needle insertion depth: 5 cm cm Catheter type: closed end flexible Catheter size: 19 Gauge Catheter at skin depth: 10 cm Test dose: negative and Other  Assessment Sensory level: T9 Events: blood not aspirated, injection not painful, no injection resistance, negative IV test and no paresthesia  Additional Notes Reason for block:procedure for pain

## 2016-06-20 NOTE — Anesthesia Preprocedure Evaluation (Signed)
Anesthesia Evaluation  Patient identified by MRN, date of birth, ID band Patient awake    Reviewed: Allergy & Precautions, H&P , NPO status , Patient's Chart, lab work & pertinent test results  Airway Mallampati: II  TM Distance: >3 FB Neck ROM: full    Dental   Pulmonary Current Smoker,    Pulmonary exam normal        Cardiovascular negative cardio ROS Normal cardiovascular exam     Neuro/Psych    GI/Hepatic negative GI ROS, Neg liver ROS,   Endo/Other  negative endocrine ROS  Renal/GU negative Renal ROS     Musculoskeletal   Abdominal Normal abdominal exam  (+)   Peds  Hematology negative hematology ROS (+)   Anesthesia Other Findings   Reproductive/Obstetrics (+) Pregnancy                             Anesthesia Physical Anesthesia Plan  ASA: II  Anesthesia Plan: Epidural   Post-op Pain Management:    Induction:   Airway Management Planned:   Additional Equipment:   Intra-op Plan:   Post-operative Plan:   Informed Consent: I have reviewed the patients History and Physical, chart, labs and discussed the procedure including the risks, benefits and alternatives for the proposed anesthesia with the patient or authorized representative who has indicated his/her understanding and acceptance.     Plan Discussed with:   Anesthesia Plan Comments:         Anesthesia Quick Evaluation

## 2016-06-20 NOTE — Progress Notes (Signed)
S:  "something could be going on down there" - some irritation and itching         suppressive therapy with Valtrex since 32 weeks         states no nerve pain or burning which is her usual first symptom with outbreaks in past  O:  VS: Blood pressure 113/72, pulse 100, temperature 98.2 F (36.8 C), temperature source Oral, height 5\' 7"  (1.702 m), weight 84.8 kg (187 lb).        FHR : baseline 130 / variability moderate / accelerations + / no decelerations        Toco: occasional ctx         no herpetic lesions or ulcerations  A: induction of labor     FHR category 1      history of HSV  P: no evidence of current outbreak - no lesions or ulcerations     Marlinda MikeBAILEY, Demontez Novack CNM, MSN, FACNM 06/20/2016, 8:36 AM

## 2016-06-20 NOTE — H&P (Signed)
Abigail White is a 36 y.o. female presenting for IOL for GDM. OB History    Gravida Para Term Preterm AB Living   4 2 2   1 2    SAB TAB Ectopic Multiple Live Births     1     2     Past Medical History:  Diagnosis Date  . ADHD (attention deficit hyperactivity disorder)   . Allergy   . Anxiety    H/O  . Carpal tunnel syndrome   . Chest pain    H/O  . Chronic fatigue   . Depression   . Dyspnea    H/O  . Hx of pyelonephritis   . Migraine   . Palpitations    H/O  . PTSD (post-traumatic stress disorder)   . Vaginal Pap smear, abnormal    Past Surgical History:  Procedure Laterality Date  . LEEP    . NASAL SINUS SURGERY  1999   Family History: family history includes Aneurysm in her paternal grandfather; Asthma in her mother and son; Cancer in her maternal grandfather and paternal grandmother; Coronary artery disease (age of onset: 62) in her mother; Diabetes in her mother and other; Heart disease in her mother; Hypertension in her brother, brother, mother, and other; Hyperthyroidism in her mother; Kidney Stones in her brother and mother; Kidney disease in her mother; Other in her father; Stroke in her paternal grandmother. Social History:  reports that she has been smoking Cigarettes.  She has been smoking about 1.00 pack per day. She has never used smokeless tobacco. She reports that she drinks about 0.6 oz of alcohol per week . She reports that she does not use drugs.     Maternal Diabetes: No Genetic Screening: Normal Maternal Ultrasounds/Referrals: Normal Fetal Ultrasounds or other Referrals:  None Maternal Substance Abuse:  No Significant Maternal Medications:  None Significant Maternal Lab Results:  None Other Comments:  None  Review of Systems  Constitutional: Negative.   All other systems reviewed and are negative.  Maternal Medical History:  Contractions: Onset was less than 1 hour ago.   Perceived severity is mild.    Fetal activity: Perceived fetal  activity is normal.   Last perceived fetal movement was within the past hour.    Prenatal complications: no prenatal complications Prenatal Complications - Diabetes: gestational.    Dilation: 2 Effacement (%): 70 Station: -1 Exam by:: Algernon Mundie Blood pressure (!) 115/59, pulse 77, temperature 98.1 F (36.7 C), temperature source Oral, height 5\' 7"  (1.702 m), weight 84.8 kg (187 lb), SpO2 100 %. Maternal Exam:  Uterine Assessment: Contraction strength is mild.  Contraction frequency is rare.   Abdomen: Patient reports no abdominal tenderness. Fetal presentation: vertex  Introitus: Normal vulva. Normal vagina.  Ferning test: not done.  Nitrazine test: not done. Amniotic fluid character: not assessed.  Pelvis: adequate for delivery.   Cervix: Cervix evaluated by digital exam.     Physical Exam  Nursing note and vitals reviewed. Constitutional: She is oriented to person, place, and time. She appears well-developed and well-nourished.  HENT:  Head: Normocephalic and atraumatic.  Neck: Normal range of motion. Neck supple.  Cardiovascular: Normal rate and regular rhythm.   Respiratory: Effort normal and breath sounds normal.  GI: Bowel sounds are normal.  Genitourinary: Vagina normal and uterus normal.  Neurological: She is alert and oriented to person, place, and time. She has normal reflexes.  Skin: Skin is warm and dry.  Psychiatric: She has a normal mood and affect.  Prenatal labs: ABO, Rh: --/--/O NEG, O NEG (02/13 0917) Antibody: NEG (02/13 0917) Rubella: Immune (07/13 0000) RPR: Nonreactive (07/13 0000)  HBsAg: Negative (07/13 0000)  HIV: Non-reactive (07/13 0000)  GBS: Negative (01/16 0000)   Assessment/Plan: GDM- poor compliance 39 wk IUP IOL BS q 6   Bonniejean Piano J 06/20/2016, 12:53 PM

## 2016-06-20 NOTE — Anesthesia Pain Management Evaluation Note (Signed)
  CRNA Pain Management Visit Note  Patient: Abigail White, 36 y.o., female  "Hello I am a member of the anesthesia team at [facility]. We have an anesthesia team available at all times to provide care throughout the hospital, including epidural management and anesthesia for C-section. I don't know your plan for the delivery whether it a natural birth, water birth, IV sedation, nitrous supplementation, doula or epidural, but we want to meet your pain goals."   1.Was your pain managed to your expectations on prior hospitalizations?   Yes   2.What is your expectation for pain management during this hospitalization?     Epidural  3.How can we help you reach that goal? Epidural management  Record the patient's initial score and the patient's pain goal.   Pain: 2  Pain Goal: 5 [facility] wants you to be able to say your pain was always managed very well.  Edison PaceWILKERSON,Abigail White 06/20/2016

## 2016-06-20 NOTE — Progress Notes (Signed)
Pt has history of HSV and has been taking valtrex. States feels like something is "going on down there". Would like to be examined for lesions before proceeding with IOL. Taavon notified.

## 2016-06-21 ENCOUNTER — Encounter (HOSPITAL_COMMUNITY): Payer: Self-pay

## 2016-06-21 LAB — CBC
HCT: 33.5 % — ABNORMAL LOW (ref 36.0–46.0)
Hemoglobin: 11.5 g/dL — ABNORMAL LOW (ref 12.0–15.0)
MCH: 31.6 pg (ref 26.0–34.0)
MCHC: 34.3 g/dL (ref 30.0–36.0)
MCV: 92 fL (ref 78.0–100.0)
Platelets: 241 10*3/uL (ref 150–400)
RBC: 3.64 MIL/uL — ABNORMAL LOW (ref 3.87–5.11)
RDW: 13.6 % (ref 11.5–15.5)
WBC: 12.4 10*3/uL — ABNORMAL HIGH (ref 4.0–10.5)

## 2016-06-21 LAB — RPR: RPR Ser Ql: NONREACTIVE

## 2016-06-21 MED ORDER — RHO D IMMUNE GLOBULIN 1500 UNIT/2ML IJ SOSY
300.0000 ug | PREFILLED_SYRINGE | Freq: Once | INTRAMUSCULAR | Status: AC
  Administered 2016-06-21: 300 ug via INTRAMUSCULAR
  Filled 2016-06-21: qty 2

## 2016-06-21 MED ORDER — RHO D IMMUNE GLOBULIN 1500 UNIT/2ML IJ SOSY
300.0000 ug | PREFILLED_SYRINGE | Freq: Once | INTRAMUSCULAR | Status: DC
  Filled 2016-06-21: qty 2

## 2016-06-21 MED ORDER — OXYCODONE-ACETAMINOPHEN 5-325 MG PO TABS
0.5000 | ORAL_TABLET | ORAL | Status: DC | PRN
  Administered 2016-06-21 – 2016-06-22 (×3): 0.5 via ORAL
  Filled 2016-06-21 (×3): qty 1

## 2016-06-21 NOTE — Anesthesia Postprocedure Evaluation (Signed)
Anesthesia Post Note  Patient: Danella Maiersshley M Emory  Procedure(s) Performed: * No procedures listed *  Patient location during evaluation: Mother Baby Anesthesia Type: Epidural Level of consciousness: awake, awake and alert, oriented and patient cooperative Pain management: pain level controlled Vital Signs Assessment: post-procedure vital signs reviewed and stable Respiratory status: spontaneous breathing, nonlabored ventilation and respiratory function stable Cardiovascular status: stable Postop Assessment: no headache, no backache, no signs of nausea or vomiting and patient able to bend at knees Anesthetic complications: no        Last Vitals:  Vitals:   06/20/16 2103 06/21/16 0116  BP: (!) 106/53 (!) 107/53  Pulse: 87 75  Resp: 20 16  Temp: 36.8 C 36.8 C    Last Pain:  Vitals:   06/21/16 0730  TempSrc:   PainSc: 0-No pain   Pain Goal:                 Dominyck Reser L

## 2016-06-21 NOTE — Progress Notes (Addendum)
Patient ID: Raymond GurneyAshley M White, female   DOB: 1980/09/10, 36 y.o.   MRN: [id number] PPD # 1 SVD  S:  Reports feeling well.             Tolerating po/ No nausea or vomiting             Bleeding is light             Pain controlled with ibuprofen (OTC) and narcotic analgesics including Percocet - only 1/2 tab             Up ad lib / ambulatory / voiding without difficulties     Information for the patient's newborn:  Jana Halfmory, Girl Morrie Sheldonshley [[id number]][[id number]]  female "Lillanne"   breast feeding  - "spitting up a lot, so ped wants her to stay an additional day for obs"   O:  A & O x 3, in no apparent distress              VS:  Vitals:   06/20/16 1931 06/20/16 2005 06/20/16 2103 06/21/16 0116  BP: (!) 116/59 111/61 (!) 106/53 (!) 107/53  Pulse: 82 88 87 75  Resp: 18 18 20 16   Temp:  99 F (37.2 C) 98.3 F (36.8 C) 98.2 F (36.8 C)  TempSrc:  Oral Oral Oral  SpO2:      Weight:      Height:        LABS:  Recent Labs  06/20/16 0749 06/21/16 0515  WBC 12.4* 12.4*  HGB 12.4 11.5*  HCT 35.0* 33.5*  PLT 257 241    Blood type: O NEG (02/13 0917) / Infant Rh POS - Rhophylac indicated  Rubella: Immune (07/13 0000)   I&O: I/O last 3 completed shifts: In: -  Out: 1150 [Urine:1000; Blood:150]          No intake/output data recorded.    Abdomen: soft, non-tender, non-distended             Fundus: firm, non-tender, U-1  Perineum: Intact, no edema  Lochia: minimal  Extremities: No edema, no calf pain or tenderness    A/P: PPD # 1 35 y.o., [id number]   Principal Problem:   Postpartum care following vaginal delivery (2/13) Active Problems:   Indication for care in labor or delivery   Spontaneous vaginal delivery    Doing well - stable status  Routine post partum orders  Change Percocet order to 0.5 tab every 4 hrs prn  Anticipate discharge tomorrow    Raelyn MoraAWSON, Haldon Carley, M, MSN, CNM 06/21/2016, 8:35 AM

## 2016-06-21 NOTE — Progress Notes (Signed)
Pt c/o of pain after motrin and tylenol given. Offered percocet 1 tablet. Advised pt that we could not cut the pill in half and an order from MD would be required. Pt states that she would take the whole tablet. Pt had pill in her mouth prior to be leaving room. Went back to reassess pt and she states that she decided to cut pill in half herself. Half percocet wasted and witnessed by Camelia Engerri, Rn.

## 2016-06-21 NOTE — Lactation Note (Signed)
This note was copied from a baby's chart. Lactation Consultation Note  Patient Name: Abigail White Emory ZOXWR'UToday's Date: 06/21/2016 Reason for consult: Initial assessment   Initial assessment with Exp BF mom of 16 hour old infant. Mom reports she plans to BF until she returns to work and then switch to formula. Mom reports she BF her 36 yo for 5 weeks and her 36 yo did not latch.   Infant was latched and feeding in the football hold on the left breast. Infant was supported well on pillows at the level of the breast. Mom reports some pinching with feeding, mom latched infant independently. Showed mom how to flange infant lips and enc mom to pull infant to breast vs bringing breast to infant. Mom reports is felt a little better. She reports her left breast is feeling fuller today. Mom with soft compressible breasts with everted nipples. Nipple was round when infant came off the breast. Enc mom to feed infant STS 8-12 x in 24 hours. Parents are maintaining feeding log.    BF Resources Handout and [facility] Bro[facility] reviewed, mom informed of IP/OP Services, BF Support Groups and LC phone #. Enc mom to call out for feeding assistance as needed. Mom requested a manual pump, pump given with instructions for use and cleaning. Mom is to call her insurance company to inquire about a breast pump. Mom without questions/concerns.    Maternal Data Formula Feeding for Exclusion: No Has patient been taught Hand Expression?: Yes Does the patient have breastfeeding experience prior to this delivery?: Yes  Feeding Feeding Type: Breast Fed Length of feed: 15 min (still feeding when I left the room)  LATCH Score/Interventions Latch: Grasps breast easily, tongue down, lips flanged, rhythmical sucking. Intervention(s): Adjust position;Assist with latch;Breast massage;Breast compression  Audible Swallowing: Spontaneous and intermittent  Type of Nipple: Everted at rest and after stimulation  Comfort (Breast/Nipple): Soft /  non-tender     Hold (Positioning): Assistance needed to correctly position infant at breast and maintain latch. Intervention(s): Breastfeeding basics reviewed;Support Pillows;Position options;Skin to skin  LATCH Score: 9  Lactation Tools Discussed/Used WIC Program: No Pump Review: Setup, frequency, and cleaning;Milk Storage   Consult Status Consult Status: Follow-up Date: 06/22/16 Follow-up type: In-patient    Silas FloodSharon S Shadonna Benedick 06/21/2016, 10:49 AM

## 2016-06-22 LAB — RH IG WORKUP (INCLUDES ABO/RH)
ABO/RH(D): O NEG
Fetal Screen: NEGATIVE
Gestational Age(Wks): 39
Unit division: 0

## 2016-06-22 MED ORDER — IBUPROFEN 800 MG PO TABS: 800.0000 mg | ORAL_TABLET | Freq: Three times a day (TID) | ORAL | 0 refills | Status: DC | PRN

## 2016-06-22 MED ORDER — BENZOCAINE-MENTHOL 20-0.5 % EX AERO: 1.0000 "application " | INHALATION_SPRAY | CUTANEOUS | Status: DC | PRN

## 2016-06-22 MED ORDER — COCONUT OIL OIL: 1.0000 "application " | TOPICAL_OIL | 0 refills | Status: DC | PRN

## 2016-06-22 NOTE — Clinical Social Work Maternal (Signed)
  CLINICAL SOCIAL WORK MATERNAL/CHILD NOTE  Patient Details  Name: Abigail White MRN: [id number] Date of Birth: 08/31/1980  Date:  06/22/2016  Clinical Social Worker Initiating Note:  Angel Boyd-Gilyard Date/ Time Initiated:  06/22/16/1241     Child's Name:  Lillian Coots   Legal Guardian:  Mother (FOB is Travis Chatam)   Need for Interpreter:  None   Date of Referral:  06/22/16     Reason for Referral:  Behavioral Health Issues, including SI  (hx of anxiety and depression)   Referral Source:  Central Nursery   Address:  [address] [place] [address]  Phone number:  [phone]   Household Members:  Self, Minor Children   Natural Supports (not living in the home):  Spouse/significant other, Immediate Family, Extended Family   Professional Supports: None   Employment: Full-time   Type of Work: Office Manager (RN) at [employer]   Education:  College graduate   Financial Resources:  Private Insurance   Other Resources:      Cultural/Religious Considerations Which May Impact Care:  Per MOB's Face Sheet, MOB is [employer].  Strengths:  Ability to meet basic needs , Home prepared for child , Understanding of illness   Risk Factors/Current Problems:  Mental Health Concerns    Cognitive State:  Alert , Insightful , Linear Thinking    Mood/Affect:  Interested , Comfortable , Happy , Calm    CSW Assessment: CSW met with MOB to complete an assessment for hx of PTSD and hx of anxiety and depression.  When CSW arrived MOB was engaging in skin to skin.  With MOB's permission, CSW asked FOB to leave the room in effort for CSW to meet with MOB in private.  MOB was polite, inviting and interested in meeting with CSW.  MOB was curious to know why MOB had to meet with CSW and CSW explained CSW's role and the importance of MOB meeting with CSW; MOB was understanding. MOB inquired about MOB's MH hx and MOB reported being dx with anxiety/depression around age 16.   MOB stated that initially MOB managed symptoms with medications and discontinued the use of medications years ago. However, if a need arise MOB will make an appointment a with NP (name unknown) at [facility], whom will manage MOB's symptoms with [place]. MOB stated that MOB has been an established patient with [facility] since 2010. CSW educated MOB about PPD. CSW informed MOB of possible supports and interventions to decrease PPD.  CSW also encouraged MOB to seek medical attention if needed for increased signs and symptoms for PPD. MOB disclosed PPD dx with MOB's 2nd child.  MOB stated that MOB experienced SI, hopelessness, and uncontrollable crying.  MOB reported that MOB took the initiative and contacted [facility] and was prescribed Zoloft. MOB reports feeling comfortable reaching out to her OBGYN if a need arise again. MOB communicated insight and awareness about her MH and CSW praised MOB. MOB denied DV, current SI and HI, and SA. MOB states the MOB feels prepared to care for infant and MOB has a wealth of support.  CSW thanked MOB for meeting with CSW and provided MOB with CW contact information.  No other psychosocial stressors were identified.   CSW Plan/Description:  Information/Referral to Community Resources , Patient/Family Education , No Further Intervention Required/No Barriers to Discharge   Angel Boyd-Gilyard, MSW, LCSW Clinical Social Work [phone]   ANGEL D BOYD-GILYARD, LCSW 06/22/2016, 12:45 PM 

## 2016-06-22 NOTE — Discharge Summary (Signed)
Obstetric Discharge Summary Reason for Admission: induction of labor and gestational diabetec class A1 Prenatal Procedures: NST and ultrasound Intrapartum Procedures: spontaneous vaginal delivery and epidural Postpartum Procedures: Rho(D) Ig Complications-Operative and Postpartum: none Hemoglobin  Date Value Ref Range Status  06/21/2016 11.5 (L) 12.0 - 15.0 g/dL Final   HCT  Date Value Ref Range Status  06/21/2016 33.5 (L) 36.0 - 46.0 % Final    Physical Exam:  General: alert, cooperative and no distress Lochia: inappropriate Uterine Fundus: firm Incision: na DVT Evaluation: No cords or calf tenderness. No significant calf/ankle edema.  Discharge Diagnoses: Term Pregnancy-delivered  Discharge Information: Date: 06/22/2016 Activity: pelvic rest Diet: routine Medications: PNV and Ibuprofen Condition: stable Instructions: refer to practice specific booklet  Discharge to: home Follow-up Information    Lenoard AdenAAVON,RICHARD J, MD. Schedule an appointment as soon as possible for a visit in 6 week(s).   Specialty:  Obstetrics and Gynecology Contact information: [address] [place] [address] [phone]           Newborn Data: Live born female Lillyanna Birth Weight: 6 lb 5.1 oz (2865 g) APGAR: 9, 9  Home with mother.  Neta MendsDaniela C Quantisha Marsicano, CNM 06/22/2016, 8:55 AM

## 2016-06-22 NOTE — Progress Notes (Signed)
Post Partum Day #2           Information for the patient's newborn:  Jana Halfmory, Girl Morrie Sheldonshley [[id number]][[id number]]  female  Baby name: Lillyanna Feeding: breast and bottle  Subjective: No HA, SOB, CP, F/C, breast symptoms. Pain controlled w/ ibubrofen. Normal vaginal bleeding, no clots.      Objective:  VS:  Vitals:   06/20/16 2005 06/20/16 2103 06/21/16 0116 06/21/16 1800  BP: 111/61 (!) 106/53 (!) 107/53 (!) 106/57  Pulse: 88 87 75 92  Resp: 18 20 16 18   Temp: 99 F (37.2 C) 98.3 F (36.8 C) 98.2 F (36.8 C) 98 F (36.7 C)  TempSrc: Oral Oral Oral Oral  SpO2:      Weight:      Height:        No intake or output data in the 24 hours ending 06/22/16 0824     Recent Labs  06/20/16 0749 06/21/16 0515  WBC 12.4* 12.4*  HGB 12.4 11.5*  HCT 35.0* 33.5*  PLT 257 241    Blood type: --/--/O NEG (02/14 0515) / Rhophylac given Rubella: Immune (07/13 0000)    Physical Exam:  General: alert, cooperative and no distress Uterine Fundus: firm Lochia: appropriate Perineum: intact, edema none DVT Evaluation: No cords or calf tenderness. No significant calf/ankle edema.    Assessment/Plan: PPD # 2 / 36 y.o., [id number] S/P:induced vaginal   Principal Problem:   Postpartum care following vaginal delivery (2/13)   GDM A1 - F/U 2GTT at 8-12 wks PP    normal postpartum exam  Continue current postpartum care             DC home today w/ instructions  [facility] and PRN   LOS: 2 days   Neta Mendsaniela C Paul, CNM, MSN 06/22/2016, 8:24 AM

## 2016-06-22 NOTE — Lactation Note (Signed)
This note was copied from a baby's chart. Lactation Consultation Note; Per Pierina RN mom does not want to see Lactation today.   Patient Name: Abigail White XBJYN'WToday's Date: 06/22/2016     Maternal Data    Feeding    LATCH Score/Interventions                      Lactation Tools Discussed/Used     Consult Status      [name], [name] 06/22/2016, 11:05 AM

## 2016-07-25 ENCOUNTER — Encounter: Payer: Self-pay | Admitting: Physician Assistant

## 2016-07-25 ENCOUNTER — Other Ambulatory Visit: Payer: Self-pay | Admitting: Family Medicine

## 2016-07-25 ENCOUNTER — Ambulatory Visit (INDEPENDENT_AMBULATORY_CARE_PROVIDER_SITE_OTHER): Payer: 59 | Admitting: Physician Assistant

## 2016-07-25 VITALS — BP 100/70 | HR 88 | Temp 98.0°F | Resp 14 | Ht 67.0 in | Wt 167.0 lb

## 2016-07-25 DIAGNOSIS — G43909 Migraine, unspecified, not intractable, without status migrainosus: Secondary | ICD-10-CM | POA: Diagnosis not present

## 2016-07-25 DIAGNOSIS — F419 Anxiety disorder, unspecified: Secondary | ICD-10-CM | POA: Diagnosis not present

## 2016-07-25 DIAGNOSIS — F988 Other specified behavioral and emotional disorders with onset usually occurring in childhood and adolescence: Secondary | ICD-10-CM

## 2016-07-25 MED ORDER — ONDANSETRON HCL 4 MG PO TABS: 4.0000 mg | ORAL_TABLET | Freq: Three times a day (TID) | ORAL | 0 refills | Status: DC | PRN

## 2016-07-25 MED ORDER — KETOROLAC TROMETHAMINE 60 MG/2ML IM SOLN
60.0000 mg | Freq: Once | INTRAMUSCULAR | Status: AC
  Administered 2016-07-25: 60 mg via INTRAMUSCULAR

## 2016-07-25 MED ORDER — AMPHETAMINE-DEXTROAMPHET ER 10 MG PO CP24: 10.0000 mg | ORAL_CAPSULE | Freq: Every day | ORAL | 0 refills | Status: DC

## 2016-07-25 MED ORDER — ALPRAZOLAM 0.25 MG PO TABS: 0.2500 mg | ORAL_TABLET | Freq: Two times a day (BID) | ORAL | 1 refills | Status: DC | PRN

## 2016-07-25 MED ORDER — AMPHETAMINE-DEXTROAMPHETAMINE 5 MG PO TABS: 5.0000 mg | ORAL_TABLET | Freq: Every day | ORAL | 0 refills | Status: DC

## 2016-07-25 NOTE — Progress Notes (Signed)
Pre visit review using our clinic review tool, if applicable. No additional management support is needed unless otherwise documented below in the visit note. 

## 2016-07-25 NOTE — Progress Notes (Signed)
Pt presents to clinic today for new pt establishment.   Adjustment Disorder with Anxiety -- diagnosed after multiple deaths in the family. Has attempted multiple medications in the past -- SSRIs and SNRIs to which all were either sub-therapeutic or caused significant reactions. Denies SI/HI.  Xanax - only taken as needed - took 1/4 for meetings - states she would prefer 0.25 mg instead of 0.5 so she could cut into half instead of 1/4's. Only used to being given 30 tablets per year  ADHD -- Diagnosed by Psychiatry. Previously on regimen of Adderall XR 10mg  once daily. Also takes Adderall IR 5 mg fast acting every morning. Then 5 mg fast acting again as needed. Was previously on higher dose of Adderall XR but affected sleep significantly.   Migraines -- Positive history. Previously followed by Neurology several years in the past. Previously averaging 1-2 migraines per month.  Has been having more frequent migraines since the birth of her child 4 weeks ago. Has had almost daily headaches this past week, coming and going. Headaches are described as frontal, currently rated as a pounding 8/10 severity  Pt states that she is not getting much sleep due to newborn.  Currently taking Ibuprofen 800mg  TID and Excedrin with minimal relief. Admits nausea with headache, denies vomiting. Admits peripheral vision aura when the headache is really bad, states that the room "looks hazy." Denies dizziness, LOC.   Immunizations - UTD per patient.                                        Colonoscopy - performed because of IBS  Past Medical History:  Diagnosis Date  . ADHD (attention deficit hyperactivity disorder)   . Allergy   . Anxiety    H/O  . Carpal tunnel syndrome   . Chest pain    H/O  . Chronic fatigue   . Depression    Gestational  . Dyspnea    H/O  . History of chickenpox   . Hx of pyelonephritis   . Migraine   . Palpitations    H/O  . Postpartum care following vaginal delivery (2/13) 06/21/2016    . PTSD (post-traumatic stress disorder)   . Spontaneous vaginal delivery 06/21/2016  . Vaginal Pap smear, abnormal     Current Outpatient Prescriptions on File Prior to Visit  Medication Sig Dispense Refill  . albuterol (PROAIR HFA) 108 (90 Base) MCG/ACT inhaler Inhale 2 puffs into the lungs every 6 (six) hours as needed. 8.5 Inhaler 0  . benzocaine-Menthol (DERMOPLAST) 20-0.5 % AERO Apply 1 application topically as needed for irritation (perineal discomfort).    Marland Kitchen ibuprofen (ADVIL,MOTRIN) 800 MG tablet Take 1 tablet (800 mg total) by mouth every 8 (eight) hours as needed for cramping. 60 tablet 0  . Prenatal Vit-Fe Fumarate-FA (PRENATAL MULTIVITAMIN) TABS tablet Take 1 tablet by mouth daily at 12 noon.     No current facility-administered medications on file prior to visit.     Allergies  Allergen Reactions  . Effexor [Venlafaxine]     Multiple somatic issues  . Lexapro [Escitalopram Oxalate]     Mania   . Paxil [Paroxetine Hcl]     Feels detached   . Penicillins Hives    Has patient had a PCN reaction causing immediate rash, facial/tongue/throat swelling, SOB or lightheadedness with hypotension: no Has patient had a PCN reaction causing severe rash involving mucus  membranes or skin necrosis: yes Has patient had a PCN reaction that required hospitalization no Has patient had a PCN reaction occurring within the last 10 years: no If all of the above answers are "NO", then may proceed with Cephalosporin use.   . Sulfa Antibiotics   . Wellbutrin [Bupropion]     Became belligerant  . Zoloft [Sertraline Hcl] Hives    rash  . Differin [Adapalene] Rash  . Latex Rash    Family History  Problem Relation Age of Onset  . Coronary artery disease Mother 64  . Hypertension Mother   . Diabetes Mother   . Heart disease Mother   . Kidney Stones Mother   . Asthma Mother   . Kidney disease Mother   . Hyperthyroidism Mother   . COPD Mother   . Heart attack Mother   . Dementia Mother    . Other Father     Shot-violence  . Alcohol abuse Father   . Hypertension Brother   . Kidney Stones Brother   . Hyperlipidemia Brother   . COPD Brother   . Heart attack Brother   . Diabetes Brother   . Hypertension Brother   . Hypertension Maternal Grandmother   . Hyperlipidemia Maternal Grandmother   . Cancer Maternal Grandmother     Skin  . COPD Maternal Grandmother   . Diabetes Maternal Grandmother   . Cancer Maternal Grandfather     Bone  . Cancer Paternal Grandmother   . Stroke Paternal Grandmother   . Aneurysm Paternal Grandfather   . Hypertension Other   . Diabetes Other   . Asthma Son   . Hypertension Maternal Aunt   . COPD Maternal Aunt   . Diabetes Maternal Aunt   . Heart attack Maternal Aunt   . Cancer Paternal Aunt     Breast    Social History   Social History  . Marital status: Married    Spouse name: N/A  . Number of children: 3  . Years of education: 4   Occupational History  . nusre  Other   Social History Main Topics  . Smoking status: Current Every Day Smoker    Packs/day: 0.50    Years: 20.00    Types: Cigarettes  . Smokeless tobacco: Never Used  . Alcohol use No  . Drug use: No  . Sexual activity: Yes   Other Topics Concern  . None   Social History Narrative  . None   Review of Systems  Constitutional: Negative for chills, fever and malaise/fatigue.  HENT: Negative for ear pain, hearing loss and tinnitus.   Eyes: Positive for photophobia. Negative for blurred vision, double vision, pain, discharge and redness.  Respiratory: Negative for cough and shortness of breath.   Cardiovascular: Negative for chest pain and palpitations.  Genitourinary: Negative for dysuria, flank pain, frequency, hematuria and urgency.  Neurological: Positive for headaches. Negative for dizziness, tingling and loss of consciousness.  Psychiatric/Behavioral: Negative for depression, hallucinations, substance abuse and suicidal ideas. The patient is  nervous/anxious. The patient does not have insomnia.    BP 100/70   Pulse 88   Temp 98 F (36.7 C) (Oral)   Resp 14   Ht 5\' 7"  (1.702 m)   Wt 167 lb (75.8 kg)   SpO2 98%   BMI 26.16 kg/m   Physical Exam  Constitutional: She is oriented to person, place, and time and well-developed, well-nourished, and in no distress. No distress.  HENT:  Right Ear: Tympanic membrane and external  ear normal.  Left Ear: Tympanic membrane and external ear normal.  Mouth/Throat: Oropharynx is clear and moist. No oropharyngeal exudate.  Eyes: Conjunctivae are normal. Pupils are equal, round, and reactive to light. No scleral icterus.  Cardiovascular: Normal rate, regular rhythm and normal heart sounds.  Exam reveals no gallop and no friction rub.   No murmur heard. Pulmonary/Chest: Effort normal. No respiratory distress. She has no wheezes. She has no rales.  Neurological: She is alert and oriented to person, place, and time. No cranial nerve deficit.  Skin: Skin is warm and dry. She is not diaphoretic.  Psychiatric: Mood, memory, affect and judgment normal.  Vitals reviewed.   Recent Results (from the past 2160 hour(s))  OB RESULT CONSOLE Group B Strep     Status: None   Collection Time: 05/23/16 12:00 AM  Result Value Ref Range   GBS Negative   CBC     Status: Abnormal   Collection Time: 06/20/16  7:49 AM  Result Value Ref Range   WBC 12.4 (H) 4.0 - 10.5 K/uL   RBC 3.83 (L) 3.87 - 5.11 MIL/uL   Hemoglobin 12.4 12.0 - 15.0 g/dL   HCT 16.135.0 (L) 09.636.0 - 04.546.0 %   MCV 91.4 78.0 - 100.0 fL   MCH 32.4 26.0 - 34.0 pg   MCHC 35.4 30.0 - 36.0 g/dL   RDW 40.913.5 81.111.5 - 91.415.5 %   Platelets 257 150 - 400 K/uL  RPR     Status: None   Collection Time: 06/20/16  7:49 AM  Result Value Ref Range   RPR Ser Ql Non Reactive Non Reactive    Comment: (NOTE) Performed At: [facility] [address] [place], [place] [id number] Mila HomerHancock Queena Monrreal F MD YQ:[phone]   Glucose, random     Status: Abnormal    Collection Time: 06/20/16  7:50 AM  Result Value Ref Range   Glucose, Bld 115 (H) 65 - 99 mg/dL  Type and screen     Status: None   Collection Time: 06/20/16  9:17 AM  Result Value Ref Range   ABO/RH(D) O NEG    Antibody Screen NEG    Sample Expiration 06/23/2016   ABO/Rh     Status: None   Collection Time: 06/20/16  9:17 AM  Result Value Ref Range   ABO/RH(D) O NEG   Glucose, capillary     Status: Abnormal   Collection Time: 06/20/16  2:06 PM  Result Value Ref Range   Glucose-Capillary 126 (H) 65 - 99 mg/dL  CBC     Status: Abnormal   Collection Time: 06/21/16  5:15 AM  Result Value Ref Range   WBC 12.4 (H) 4.0 - 10.5 K/uL   RBC 3.64 (L) 3.87 - 5.11 MIL/uL   Hemoglobin 11.5 (L) 12.0 - 15.0 g/dL   HCT 52.833.5 (L) 41.336.0 - 24.446.0 %   MCV 92.0 78.0 - 100.0 fL   MCH 31.6 26.0 - 34.0 pg   MCHC 34.3 30.0 - 36.0 g/dL   RDW 01.013.6 27.211.5 - 53.615.5 %   Platelets 241 150 - 400 K/uL  Rh IG workup (includes ABO/Rh)     Status: None   Collection Time: 06/21/16  5:15 AM  Result Value Ref Range   Gestational Age(Wks) 39    ABO/RH(D) O NEG    Fetal Screen NEG    Unit Number [id number]    Blood Component Type RHIG    Unit division 00    Status of Unit ISSUED,FINAL  Transfusion Status OK TO TRANSFUSE    Assessment/Plan: Migraine without status migrainosus, not intractable IM toradol given to break cyclic headache.  Supportive measures, OTC medications and Rx medications reviewed. If headaches continue will need reassessment by Neurology.  Anxiety Will continue medication on an as-needed basis for acute anxiety only. Limited supply given.  CSC signed.  Attention deficit disorder (ADD) Will restart extended-release Adderall as patient is no longer pregnant and is not breast-feeding. Follow-up 1 month for reassessment.    Piedad Climes, PA-C

## 2016-07-25 NOTE — Patient Instructions (Signed)
The Toradol given should break the cycle of headaches.  I have sent in a refill of Zofran if needed for nausea. Hydrate well and get plenty of rest.  Headaches should become less frequent as hormonal changes settle down.   If not we will need to set you back up with Neurology.  Please restart the XR Adderall at first. See how you are tolerating before restarting the short-acting medication  Follow-up in 2-3 weeks for complete physical. Return sooner if needed.

## 2016-07-26 ENCOUNTER — Encounter: Payer: Self-pay | Admitting: Emergency Medicine

## 2016-07-26 ENCOUNTER — Other Ambulatory Visit: Payer: Self-pay | Admitting: Obstetrics and Gynecology

## 2016-07-26 DIAGNOSIS — F988 Other specified behavioral and emotional disorders with onset usually occurring in childhood and adolescence: Secondary | ICD-10-CM | POA: Insufficient documentation

## 2016-07-30 DIAGNOSIS — G43909 Migraine, unspecified, not intractable, without status migrainosus: Secondary | ICD-10-CM | POA: Insufficient documentation

## 2016-07-30 NOTE — Assessment & Plan Note (Signed)
IM toradol given to break cyclic headache.  Supportive measures, OTC medications and Rx medications reviewed. If headaches continue will need reassessment by Neurology.

## 2016-07-30 NOTE — Assessment & Plan Note (Signed)
Will restart extended-release Adderall as patient is no longer pregnant and is not breast-feeding. Follow-up 1 month for reassessment.

## 2016-07-30 NOTE — Assessment & Plan Note (Signed)
Will continue medication on an as-needed basis for acute anxiety only. Limited supply given.  CSC signed.

## 2016-08-01 ENCOUNTER — Encounter (HOSPITAL_COMMUNITY): Payer: Self-pay

## 2016-08-01 NOTE — Patient Instructions (Signed)
Your procedure is scheduled on:  Thursday, August 10, 2016  [facility] at:  6:30 AM  Pick up the phone at the desk and dial [phone].  Call this number if you have problems the morning of surgery: [phone].  Remember: Do NOT eat food or drink after:  Midnight Wednesday  Take these medicines the morning of surgery with a SIP OF WATER:  Zantac, Xanax if needed  Bring Asthma Inhaler day of surgery  Stop ALL herbal medications and Ibuprofen at this time  Do NOT smoke the day of surgery.  Do NOT wear jewelry (body piercing), metal hair clips/bobby pins, make-up, or nail polish. Do NOT wear lotions, powders, or perfumes.  You may wear deodorant. Do NOT shave for 48 hours prior to surgery. Do NOT bring valuables to the hospital. Contacts, dentures, or bridgework may not be worn into surgery.  Have a responsible adult drive you home and stay with you for 24 hours after your procedure  Bring a copy of your healthcare power of attorney and living will documents.

## 2016-08-02 ENCOUNTER — Inpatient Hospital Stay (HOSPITAL_COMMUNITY)
Admission: RE | Admit: 2016-08-02 | Discharge: 2016-08-02 | Disposition: A | Discharge status: Home / Self Care | Payer: 59 | Source: Ambulatory Visit

## 2016-08-02 HISTORY — DX: Herpesviral infection, unspecified: B00.9

## 2016-08-07 ENCOUNTER — Encounter (HOSPITAL_COMMUNITY)
Admission: RE | Admit: 2016-08-07 | Discharge: 2016-08-07 | Disposition: A | Discharge status: Home / Self Care | Payer: 59 | Source: Ambulatory Visit | Attending: Obstetrics and Gynecology | Admitting: Obstetrics and Gynecology

## 2016-08-07 ENCOUNTER — Ambulatory Visit (INDEPENDENT_AMBULATORY_CARE_PROVIDER_SITE_OTHER): Payer: 59 | Admitting: Physician Assistant

## 2016-08-07 ENCOUNTER — Other Ambulatory Visit: Payer: Self-pay | Admitting: Obstetrics and Gynecology

## 2016-08-07 ENCOUNTER — Encounter (HOSPITAL_COMMUNITY): Payer: Self-pay

## 2016-08-07 ENCOUNTER — Encounter: Payer: Self-pay | Admitting: Physician Assistant

## 2016-08-07 VITALS — BP 112/70 | HR 90 | Temp 98.3°F | Resp 14 | Ht 67.0 in | Wt 170.0 lb

## 2016-08-07 DIAGNOSIS — B9689 Other specified bacterial agents as the cause of diseases classified elsewhere: Secondary | ICD-10-CM | POA: Diagnosis not present

## 2016-08-07 DIAGNOSIS — Z0000 Encounter for general adult medical examination without abnormal findings: Secondary | ICD-10-CM | POA: Insufficient documentation

## 2016-08-07 DIAGNOSIS — F988 Other specified behavioral and emotional disorders with onset usually occurring in childhood and adolescence: Secondary | ICD-10-CM | POA: Diagnosis not present

## 2016-08-07 DIAGNOSIS — J019 Acute sinusitis, unspecified: Secondary | ICD-10-CM | POA: Diagnosis not present

## 2016-08-07 HISTORY — DX: Anemia, unspecified: D64.9

## 2016-08-07 HISTORY — DX: Gastro-esophageal reflux disease without esophagitis: K21.9

## 2016-08-07 HISTORY — DX: Nicotine dependence, unspecified, uncomplicated: F17.200

## 2016-08-07 LAB — CBC WITH DIFFERENTIAL/PLATELET
Basophils Absolute: 0.1 10*3/uL (ref 0.0–0.1)
Basophils Relative: 0.8 % (ref 0.0–3.0)
Eosinophils Absolute: 0.3 10*3/uL (ref 0.0–0.7)
Eosinophils Relative: 2.9 % (ref 0.0–5.0)
HCT: 41.1 % (ref 36.0–46.0)
Hemoglobin: 13.6 g/dL (ref 12.0–15.0)
Lymphocytes Relative: 20.5 % (ref 12.0–46.0)
Lymphs Abs: 1.9 10*3/uL (ref 0.7–4.0)
MCHC: 33 g/dL (ref 30.0–36.0)
MCV: 95.3 fl (ref 78.0–100.0)
Monocytes Absolute: 0.6 10*3/uL (ref 0.1–1.0)
Monocytes Relative: 6.6 % (ref 3.0–12.0)
Neutro Abs: 6.4 10*3/uL (ref 1.4–7.7)
Neutrophils Relative %: 69.2 % (ref 43.0–77.0)
Platelets: 315 10*3/uL (ref 150.0–400.0)
RBC: 4.31 Mil/uL (ref 3.87–5.11)
RDW: 12.9 % (ref 11.5–15.5)
WBC: 9.2 10*3/uL (ref 4.0–10.5)

## 2016-08-07 LAB — URINALYSIS, ROUTINE W REFLEX MICROSCOPIC
Bilirubin Urine: NEGATIVE
Hgb urine dipstick: NEGATIVE
Ketones, ur: NEGATIVE
Leukocytes, UA: NEGATIVE
Nitrite: NEGATIVE
RBC / HPF: NONE SEEN (ref 0–?)
Specific Gravity, Urine: 1.01 (ref 1.000–1.030)
Total Protein, Urine: NEGATIVE
Urine Glucose: NEGATIVE
Urobilinogen, UA: 0.2 (ref 0.0–1.0)
WBC, UA: NONE SEEN (ref 0–?)
pH: 7.5 (ref 5.0–8.0)

## 2016-08-07 LAB — COMPREHENSIVE METABOLIC PANEL
ALT: 21 U/L (ref 0–35)
AST: 16 U/L (ref 0–37)
Albumin: 4 g/dL (ref 3.5–5.2)
Alkaline Phosphatase: 68 U/L (ref 39–117)
BUN: 8 mg/dL (ref 6–23)
CO2: 29 mEq/L (ref 19–32)
Calcium: 9.1 mg/dL (ref 8.4–10.5)
Chloride: 106 mEq/L (ref 96–112)
Creatinine, Ser: 0.62 mg/dL (ref 0.40–1.20)
GFR: 116.13 mL/min (ref >60.00)
Glucose, Bld: 94 mg/dL (ref 70–99)
Potassium: 4.2 mEq/L (ref 3.5–5.1)
Sodium: 140 mEq/L (ref 135–145)
Total Bilirubin: 0.4 mg/dL (ref 0.2–1.2)
Total Protein: 6.3 g/dL (ref 6.0–8.3)

## 2016-08-07 LAB — LIPID PANEL
Cholesterol: 181 mg/dL (ref 0–200)
HDL: 43.1 mg/dL (ref >39.00)
LDL Cholesterol: 114 mg/dL — ABNORMAL HIGH (ref 0–99)
NonHDL: 137.42
Total CHOL/HDL Ratio: 4
Triglycerides: 118 mg/dL (ref 0.0–149.0)
VLDL: 23.6 mg/dL (ref 0.0–40.0)

## 2016-08-07 LAB — TSH: TSH: 0.46 u[IU]/mL (ref 0.35–4.50)

## 2016-08-07 LAB — HEMOGLOBIN A1C: Hgb A1c MFr Bld: 5.8 % (ref 4.6–6.5)

## 2016-08-07 MED ORDER — CYCLOBENZAPRINE HCL 5 MG PO TABS: 5.0000 mg | ORAL_TABLET | Freq: Three times a day (TID) | ORAL | 0 refills | Status: DC | PRN

## 2016-08-07 MED ORDER — AZITHROMYCIN 250 MG PO TABS: ORAL_TABLET | ORAL | 0 refills | Status: DC

## 2016-08-07 MED ORDER — AMPHETAMINE-DEXTROAMPHETAMINE 5 MG PO TABS: 5.0000 mg | ORAL_TABLET | Freq: Two times a day (BID) | ORAL | 0 refills | Status: DC

## 2016-08-07 MED ORDER — BUTALBITAL-APAP-CAFFEINE 50-325-40 MG PO TABS: 1.0000 | ORAL_TABLET | Freq: Two times a day (BID) | ORAL | 0 refills | Status: DC | PRN

## 2016-08-07 NOTE — Assessment & Plan Note (Signed)
Depression screen negative. Health Maintenance reviewed -- Tetanus up-to-date. Preventive schedule discussed and handout given in AVS. Will obtain fasting labs today.  

## 2016-08-07 NOTE — Pre-Procedure Instructions (Signed)
Patient was diagnosed with sinus infection this morning.  Has rx for zithromax.  Dr Renold Don ok for surgery.  Patient states wheezing or trouble breathing,  Will call Dr Billy Coast to let him know to see if he wants to delay surgery.  Shanelle from Dr Jorene Minors office called, Dr Billy Coast ok with patient being on zithromax for sinus infection.  No need to cancel surgery.  Patient states consent was also to include NovaSure Ablation.  Hughes Better will check with Dr Billy Coast and will edit consent form in EPIC to reflect change if needed.

## 2016-08-07 NOTE — Progress Notes (Signed)
Patient presents to clinic today for annual exam.  Patient is fasting for labs. Body mass index is 26.63 kg/m. Diet -- Endorses well-balanced overall.  Acute Concerns: Patient with > 1 week of sinus pressure, PND and cough. Now with bilateral maxillary sinus pain. Denies fever, chills. Cough worse at night. Is not taking anything for symptoms.  Health Maintenance: Immunizations -- Flu shot up-to-date.  Tetanus during last pregnancy per patient.  PAP -- 08/03/16. Results pending. + history of HPV.  Past Medical History:  Diagnosis Date  . ADHD (attention deficit hyperactivity disorder)   . Allergy   . Anxiety    H/O  . Carpal tunnel syndrome   . Chest pain    H/O  . Chronic fatigue   . Depression    Gestational  . Dyspnea    H/O  . History of chickenpox   . HSV (herpes simplex virus) infection   . Hx of pyelonephritis   . Migraine   . Palpitations    H/O  . PONV (postoperative nausea and vomiting)   . Postpartum care following vaginal delivery (2/13) 06/21/2016  . PTSD (post-traumatic stress disorder)   . Spontaneous vaginal delivery 06/21/2016  . Vaginal Pap smear, abnormal     Past Surgical History:  Procedure Laterality Date  . LEEP  2005, 2013  . NASAL SINUS SURGERY  1999    Current Outpatient Prescriptions on File Prior to Visit  Medication Sig Dispense Refill  . albuterol (PROAIR HFA) 108 (90 Base) MCG/ACT inhaler Inhale 2 puffs into the lungs every 6 (six) hours as needed. 8.5 Inhaler 0  . ALPRAZolam (XANAX) 0.25 MG tablet Take 1 tablet (0.25 mg total) by mouth 2 (two) times daily as needed for anxiety. 15 tablet 1  . benzocaine-Menthol (DERMOPLAST) 20-0.5 % AERO Apply 1 application topically as needed for irritation (perineal discomfort).    Marland Kitchen ibuprofen (ADVIL,MOTRIN) 800 MG tablet Take 1 tablet (800 mg total) by mouth every 8 (eight) hours as needed for cramping. 60 tablet 0  . ondansetron (ZOFRAN) 4 MG tablet Take 1 tablet (4 mg total) by mouth every 8  (eight) hours as needed for nausea or vomiting. 20 tablet 0  . Prenatal Vit-Fe Fumarate-FA (PRENATAL MULTIVITAMIN) TABS tablet Take 1 tablet by mouth daily at 12 noon.    . ranitidine (ZANTAC) 150 MG tablet Take 1 tablet as needed for heartburn    . valACYclovir (VALTREX) 500 MG tablet Take 1 tablet as needed for outbreak    . amphetamine-dextroamphetamine (ADDERALL XR) 10 MG 24 hr capsule Take 1 capsule (10 mg total) by mouth daily. (Patient not taking: Reported on 08/07/2016) 30 capsule 0   No current facility-administered medications on file prior to visit.     Allergies  Allergen Reactions  . Effexor [Venlafaxine]     Multiple somatic issues  . Lexapro [Escitalopram Oxalate]     Mania   . Paxil [Paroxetine Hcl]     Feels detached   . Penicillins Hives    Has patient had a PCN reaction causing immediate rash, facial/tongue/throat swelling, SOB or lightheadedness with hypotension: no Has patient had a PCN reaction causing severe rash involving mucus membranes or skin necrosis: yes Has patient had a PCN reaction that required hospitalization no Has patient had a PCN reaction occurring within the last 10 years: no If all of the above answers are "NO", then may proceed with Cephalosporin use.   . Sulfa Antibiotics   . Wellbutrin [Bupropion]  Became belligerant  . Zoloft [Sertraline Hcl] Hives    rash  . Differin [Adapalene] Rash  . Latex Rash    Family History  Problem Relation Age of Onset  . Coronary artery disease Mother 33  . Hypertension Mother   . Diabetes Mother   . Heart disease Mother   . Kidney Stones Mother   . Asthma Mother   . Kidney disease Mother   . Hyperthyroidism Mother   . COPD Mother   . Heart attack Mother   . Dementia Mother   . Other Father     Shot-violence  . Alcohol abuse Father   . Hypertension Brother   . Kidney Stones Brother   . Hyperlipidemia Brother   . COPD Brother   . Heart attack Brother   . Diabetes Brother   . Hypertension  Brother   . Hypertension Maternal Grandmother   . Hyperlipidemia Maternal Grandmother   . Cancer Maternal Grandmother     Skin  . COPD Maternal Grandmother   . Diabetes Maternal Grandmother   . Cancer Maternal Grandfather     Bone  . Cancer Paternal Grandmother   . Stroke Paternal Grandmother   . Aneurysm Paternal Grandfather   . Hypertension Other   . Diabetes Other   . Asthma Son   . Hypertension Maternal Aunt   . COPD Maternal Aunt   . Diabetes Maternal Aunt   . Heart attack Maternal Aunt   . Cancer Paternal Aunt     Breast    Social History   Social History  . Marital status: Married    Spouse name: N/A  . Number of children: 3  . Years of education: 4   Occupational History  . nusre  Other   Social History Main Topics  . Smoking status: Current Every Day Smoker    Packs/day: 0.50    Years: 20.00    Types: Cigarettes  . Smokeless tobacco: Never Used  . Alcohol use No  . Drug use: No  . Sexual activity: Yes   Other Topics Concern  . Not on file   Social History Narrative  . No narrative on file   Review of Systems  Constitutional: Negative for fever and weight loss.  HENT: Negative for ear discharge, ear pain, hearing loss and tinnitus.   Eyes: Negative for blurred vision, double vision, photophobia and pain.  Respiratory: Negative for cough and shortness of breath.   Cardiovascular: Negative for chest pain and palpitations.  Gastrointestinal: Negative for abdominal pain, blood in stool, constipation, diarrhea, heartburn, melena, nausea and vomiting.  Genitourinary: Negative for dysuria, flank pain, frequency, hematuria and urgency.  Musculoskeletal: Negative for falls.  Neurological: Negative for dizziness, loss of consciousness and headaches.  Endo/Heme/Allergies: Negative for environmental allergies.  Psychiatric/Behavioral: Negative for depression, hallucinations, substance abuse and suicidal ideas. The patient is not nervous/anxious and does not  have insomnia.    BP 112/70   Pulse 90   Temp 98.3 F (36.8 C) (Oral)   Resp 14   Ht  (1.702 m)   Wt 170 lb (77.1 kg)   SpO2 99%   Breastfeeding? No   BMI 26.63 kg/m   Physical Exam  Constitutional: She is oriented to person, place, and time and well-developed, well-nourished, and in no distress.  HENT:  Head: Normocephalic and atraumatic.  Right Ear: Tympanic membrane, external ear and ear canal normal.  Left Ear: Tympanic membrane, external ear and ear canal normal.  Nose: Mucosal edema and rhinorrhea present.  Right sinus exhibits maxillary sinus tenderness. Left sinus exhibits maxillary sinus tenderness.  Mouth/Throat: Uvula is midline and mucous membranes are normal. No posterior oropharyngeal erythema.  Eyes: Conjunctivae are normal. Pupils are equal, round, and reactive to light.  Neck: Neck supple. No thyromegaly present.  Cardiovascular: Normal rate, regular rhythm, normal heart sounds and intact distal pulses.   Pulmonary/Chest: Effort normal and breath sounds normal. No respiratory distress. She has no wheezes. She has no rales.  Abdominal: Soft. Bowel sounds are normal. She exhibits no distension and no mass. There is no tenderness. There is no rebound and no guarding.  Lymphadenopathy:    She has no cervical adenopathy.  Neurological: She is alert and oriented to person, place, and time. No cranial nerve deficit.  Skin: Skin is warm and dry. No rash noted.  Psychiatric: Affect normal.  Vitals reviewed.  Recent Results (from the past 2160 hour(s))  OB RESULT CONSOLE Group B Strep     Status: None   Collection Time: 05/23/16 12:00 AM  Result Value Ref Range   GBS Negative   CBC     Status: Abnormal   Collection Time: 06/20/16  7:49 AM  Result Value Ref Range   WBC 12.4 (H) 4.0 - 10.5 K/uL   RBC 3.83 (L) 3.87 - 5.11 MIL/uL   Hemoglobin 12.4 12.0 - 15.0 g/dL   HCT 16.1 (L) 09.6 - 04.5 %   MCV 91.4 78.0 - 100.0 fL   MCH 32.4 26.0 - 34.0 pg   MCHC 35.4 30.0  - 36.0 g/dL   RDW 40.9 81.1 - 91.4 %   Platelets 257 150 - 400 K/uL  RPR     Status: None   Collection Time: 06/20/16  7:49 AM  Result Value Ref Range   RPR Ser Ql Non Reactive Non Reactive    Comment: (NOTE) Performed At: [facility] [address] [place], [place] [id number] Mila Homer MD YQ:[id number]   Glucose, random     Status: Abnormal   Collection Time: 06/20/16  7:50 AM  Result Value Ref Range   Glucose, Bld 115 (H) 65 - 99 mg/dL  Type and screen     Status: None   Collection Time: 06/20/16  9:17 AM  Result Value Ref Range   ABO/RH(D) O NEG    Antibody Screen NEG    Sample Expiration 06/23/2016   ABO/Rh     Status: None   Collection Time: 06/20/16  9:17 AM  Result Value Ref Range   ABO/RH(D) O NEG   Glucose, capillary     Status: Abnormal   Collection Time: 06/20/16  2:06 PM  Result Value Ref Range   Glucose-Capillary 126 (H) 65 - 99 mg/dL  CBC     Status: Abnormal   Collection Time: 06/21/16  5:15 AM  Result Value Ref Range   WBC 12.4 (H) 4.0 - 10.5 K/uL   RBC 3.64 (L) 3.87 - 5.11 MIL/uL   Hemoglobin 11.5 (L) 12.0 - 15.0 g/dL   HCT 52.8 (L) 41.3 - 24.4 %   MCV 92.0 78.0 - 100.0 fL   MCH 31.6 26.0 - 34.0 pg   MCHC 34.3 30.0 - 36.0 g/dL   RDW 01.0 27.2 - 53.6 %   Platelets 241 150 - 400 K/uL  Rh IG workup (includes ABO/Rh)     Status: None   Collection Time: 06/21/16  5:15 AM  Result Value Ref Range   Gestational Age(Wks) 39    ABO/RH(D) O NEG  Fetal Screen NEG    Unit Number [id number]    Blood Component Type RHIG    Unit division 00    Status of Unit ISSUED,FINAL    Transfusion Status OK TO TRANSFUSE     Assessment/Plan: Visit for preventive health examination Depression screen negative. Health Maintenance reviewed -- Tetanus up-to-date.  Preventive schedule discussed and handout given in AVS. Will obtain fasting labs today.   Attention deficit disorder (ADD) Medications refilled with correct quantity.  Acute bacterial  sinusitis Rx Azithromycin.  Increase fluids.  Rest.  Saline nasal spray.  Probiotic.  Mucinex as directed.  Humidifier in bedroom.  Call or return to clinic if symptoms are not improving.     Piedad Climes, PA-C

## 2016-08-07 NOTE — Patient Instructions (Addendum)
Your procedure is scheduled on: Thursday, 4/5  Enter through the Main Entrance of [facility] at: 6:30 am  Pick up the phone at the desk and dial [phone].  Call this number if you have problems the morning of surgery: [phone].  Remember: Do NOT eat or drink (including water) after midnight: Take these medicines the morning of surgery with a SIP OF WATER:  Zithromax.  You may also take Xanax and Valtrex if needed.  Bring albuterol inhaler with you on day of surgery.  Do NOT wear jewelry (body piercing), metal hair clips/bobby pins, make-up, or nail polish. Do NOT wear lotions, powders, or perfumes.  You may wear deoderant. Do NOT shave for 48 hours prior to surgery. Do NOT bring valuables to the hospital. .  Have a responsible adult drive you home and stay with you for 24 hours after your procedure.  Home with husband Abigail White cell [phone].

## 2016-08-07 NOTE — Progress Notes (Signed)
Pre visit review using our clinic review tool, if applicable. No additional management support is needed unless otherwise documented below in the visit note. 

## 2016-08-07 NOTE — Assessment & Plan Note (Signed)
Medications refilled with correct quantity.

## 2016-08-07 NOTE — Patient Instructions (Signed)
Please go to the lab for blood work.   Our office will call you with your results unless you have chosen to receive results via MyChart.  If your blood work is normal we will follow-up each year for physicals and as scheduled for chronic medical problems.  If anything is abnormal we will treat accordingly and get you in for a follow-up.   Please take antibiotic as directed.  Increase fluid intake.  Use Saline nasal spray.  Take a daily multivitamin.  Place a humidifier in the bedroom.  Please call or return clinic if symptoms are not improving.  Sinusitis Sinusitis is redness, soreness, and swelling (inflammation) of the paranasal sinuses. Paranasal sinuses are air pockets within the bones of your face (beneath the eyes, the middle of the forehead, or above the eyes). In healthy paranasal sinuses, mucus is able to drain out, and air is able to circulate through them by way of your nose. However, when your paranasal sinuses are inflamed, mucus and air can become trapped. This can allow bacteria and other germs to grow and cause infection. Sinusitis can develop quickly and last only a short time (acute) or continue over a long period (chronic). Sinusitis that lasts for more than 12 weeks is considered chronic.  CAUSES  Causes of sinusitis include:  Allergies.  Structural abnormalities, such as displacement of the cartilage that separates your nostrils (deviated septum), which can decrease the air flow through your nose and sinuses and affect sinus drainage.  Functional abnormalities, such as when the small hairs (cilia) that line your sinuses and help remove mucus do not work properly or are not present. SYMPTOMS  Symptoms of acute and chronic sinusitis are the same. The primary symptoms are pain and pressure around the affected sinuses. Other symptoms include:  Upper toothache.  Earache.  Headache.  Bad breath.  Decreased sense of smell and taste.  A cough, which worsens when you  are lying flat.  Fatigue.  Fever.  Thick drainage from your nose, which often is green and may contain pus (purulent).  Swelling and warmth over the affected sinuses. DIAGNOSIS  Your caregiver will perform a physical exam. During the exam, your caregiver may:  Look in your nose for signs of abnormal growths in your nostrils (nasal polyps).  Tap over the affected sinus to check for signs of infection.  View the inside of your sinuses (endoscopy) with a special imaging device with a light attached (endoscope), which is inserted into your sinuses. If your caregiver suspects that you have chronic sinusitis, one or more of the following tests may be recommended:  Allergy tests.  Nasal culture A sample of mucus is taken from your nose and sent to a lab and screened for bacteria.  Nasal cytology A sample of mucus is taken from your nose and examined by your caregiver to determine if your sinusitis is related to an allergy. TREATMENT  Most cases of acute sinusitis are related to a viral infection and will resolve on their own within 10 days. Sometimes medicines are prescribed to help relieve symptoms (pain medicine, decongestants, nasal steroid sprays, or saline sprays).  However, for sinusitis related to a bacterial infection, your caregiver will prescribe antibiotic medicines. These are medicines that will help kill the bacteria causing the infection.  Rarely, sinusitis is caused by a fungal infection. In theses cases, your caregiver will prescribe antifungal medicine. For some cases of chronic sinusitis, surgery is needed. Generally, these are cases in which sinusitis recurs more than  3 times per year, despite other treatments. HOME CARE INSTRUCTIONS   Drink plenty of water. Water helps thin the mucus so your sinuses can drain more easily.  Use a humidifier.  Inhale steam 3 to 4 times a day (for example, sit in the bathroom with the shower running).  Apply a warm, moist washcloth to  your face 3 to 4 times a day, or as directed by your caregiver.  Use saline nasal sprays to help moisten and clean your sinuses.  Take over-the-counter or prescription medicines for pain, discomfort, or fever only as directed by your caregiver. SEEK IMMEDIATE MEDICAL CARE IF:  You have increasing pain or severe headaches.  You have nausea, vomiting, or drowsiness.  You have swelling around your face.  You have vision problems.  You have a stiff neck.  You have difficulty breathing. MAKE SURE YOU:   Understand these instructions.  Will watch your condition.  Will get help right away if you are not doing well or get worse. Document Released: 04/24/2005 Document Revised: 07/17/2011 Document Reviewed: 05/09/2011 [facility] Patient Information 2014 [place], [place].  Marland Kitchen Preventive Care 18-39 Years, Female Preventive care refers to lifestyle choices and visits with your health care provider that can promote health and wellness. What does preventive care include?  A yearly physical exam. This is also called an annual well check.  Dental exams once or twice a year.  Routine eye exams. Ask your health care provider how often you should have your eyes checked.  Personal lifestyle choices, including:  Daily care of your teeth and gums.  Regular physical activity.  Eating a healthy diet.  Avoiding tobacco and drug use.  Limiting alcohol use.  Practicing safe sex.  Taking vitamin and mineral supplements as recommended by your health care provider. What happens during an annual well check? The services and screenings done by your health care provider during your annual well check will depend on your age, overall health, lifestyle risk factors, and family history of disease. Counseling  Your health care provider may ask you questions about your:  Alcohol use.  Tobacco use.  Drug use.  Emotional well-being.  Home and relationship well-being.  Sexual  activity.  Eating habits.  Work and work Statistician.  Method of birth control.  Menstrual cycle.  Pregnancy history. Screening  You may have the following tests or measurements:  Height, weight, and BMI.  Diabetes screening. This is done by checking your blood sugar (glucose) after you have not eaten for a while (fasting).  Blood pressure.  Lipid and cholesterol levels. These may be checked every 5 years starting at age 48.  Skin check.  Hepatitis C blood test.  Hepatitis B blood test.  Sexually transmitted disease (STD) testing.  BRCA-related cancer screening. This may be done if you have a family history of breast, ovarian, tubal, or peritoneal cancers.  Pelvic exam and Pap test. This may be done every 3 years starting at age 63. Starting at age 66, this may be done every 5 years if you have a Pap test in combination with an HPV test. Discuss your test results, treatment options, and if necessary, the need for more tests with your health care provider. Vaccines  Your health care provider may recommend certain vaccines, such as:  Influenza vaccine. This is recommended every year.  Tetanus, diphtheria, and acellular pertussis (Tdap, Td) vaccine. You may need a Td booster every 10 years.  Varicella vaccine. You may need this if you have not been vaccinated.  HPV vaccine. If you are 43 or younger, you may need three doses over 6 months.  Measles, mumps, and rubella (MMR) vaccine. You may need at least one dose of MMR. You may also need a second dose.  Pneumococcal 13-valent conjugate (PCV13) vaccine. You may need this if you have certain conditions and were not previously vaccinated.  Pneumococcal polysaccharide (PPSV23) vaccine. You may need one or two doses if you smoke cigarettes or if you have certain conditions.  Meningococcal vaccine. One dose is recommended if you are age 67-21 years and a first-year college student living in a residence hall, or if you have  one of several medical conditions. You may also need additional booster doses.  Hepatitis A vaccine. You may need this if you have certain conditions or if you travel or work in places where you may be exposed to hepatitis A.  Hepatitis B vaccine. You may need this if you have certain conditions or if you travel or work in places where you may be exposed to hepatitis B.  Haemophilus influenzae type b (Hib) vaccine. You may need this if you have certain risk factors. Talk to your health care provider about which screenings and vaccines you need and how often you need them. This information is not intended to replace advice given to you by your health care provider. Make sure you discuss any questions you have with your health care provider. Document Released: 06/20/2001 Document Revised: 01/12/2016 Document Reviewed: 02/23/2015 Elsevier Interactive Patient Education  2017 [employer].

## 2016-08-07 NOTE — Assessment & Plan Note (Signed)
Rx Azithromycin.  Increase fluids.  Rest.  Saline nasal spray.  Probiotic.  Mucinex as directed.  Humidifier in bedroom.  Call or return to clinic if symptoms are not improving.  

## 2016-08-08 ENCOUNTER — Encounter: Payer: Self-pay | Admitting: Emergency Medicine

## 2016-08-10 ENCOUNTER — Encounter (HOSPITAL_COMMUNITY): Admission: RE | Payer: Self-pay | Source: Ambulatory Visit

## 2016-08-10 ENCOUNTER — Ambulatory Visit (HOSPITAL_COMMUNITY): Admission: RE | Admit: 2016-08-10 | Payer: 59 | Source: Ambulatory Visit | Admitting: Obstetrics and Gynecology

## 2016-08-10 HISTORY — DX: Other specified postprocedural states: Z98.890

## 2016-08-10 HISTORY — DX: Nausea with vomiting, unspecified: R11.2

## 2016-08-10 SURGERY — LIGATION, FALLOPIAN TUBE, LAPAROSCOPIC
Anesthesia: General

## 2016-08-15 ENCOUNTER — Telehealth: Payer: Self-pay | Admitting: Physician Assistant

## 2016-08-15 NOTE — Telephone Encounter (Signed)
Pt calling for lab results, please advise. 

## 2016-08-16 NOTE — Telephone Encounter (Signed)
Patient advised of lab results. Mailed a copy to patient per patient

## 2016-09-26 ENCOUNTER — Other Ambulatory Visit: Payer: Self-pay | Admitting: Physician Assistant

## 2016-09-26 DIAGNOSIS — J019 Acute sinusitis, unspecified: Principal | ICD-10-CM

## 2016-09-26 DIAGNOSIS — B9689 Other specified bacterial agents as the cause of diseases classified elsewhere: Secondary | ICD-10-CM

## 2016-11-03 ENCOUNTER — Ambulatory Visit (INDEPENDENT_AMBULATORY_CARE_PROVIDER_SITE_OTHER): Payer: 59 | Admitting: Physician Assistant

## 2016-11-03 ENCOUNTER — Telehealth: Payer: Self-pay | Admitting: Physician Assistant

## 2016-11-03 ENCOUNTER — Encounter: Payer: Self-pay | Admitting: Physician Assistant

## 2016-11-03 DIAGNOSIS — F419 Anxiety disorder, unspecified: Secondary | ICD-10-CM | POA: Diagnosis not present

## 2016-11-03 MED ORDER — ALPRAZOLAM 0.25 MG PO TABS: 0.2500 mg | ORAL_TABLET | Freq: Two times a day (BID) | ORAL | 0 refills | Status: DC | PRN

## 2016-11-03 MED ORDER — ADDERALL XR 10 MG PO CP24
10.0000 mg | ORAL_CAPSULE | Freq: Every day | ORAL | 0 refills | Status: DC
Start: 2016-11-03 — End: 2017-06-22

## 2016-11-03 NOTE — Patient Instructions (Addendum)
Please continue with counseling. Take the Alprazolam as directed. Download the Headspace App on your phone to help with mindfulness training.  Follow-up with me in 2 weeks.  If there is any worsening of panic attack or anxiety, please return sooner. If there is any acute chest discomfort, lightheadedness, dizziness, please go to the ER.

## 2016-11-03 NOTE — Telephone Encounter (Signed)
[facility] [place] Day - Cli TELEPHONE ADVICE RECORD [facility] Patient Name: Abigail GrateSHLEY MARIE EMOR Y DOB: 01-21-1981 [facility] Comment Caller states, she is having anxiety and chest pain- Dr. is Malva Coganody Martin. Nurse Assessment Nurse: Debera Latalston, RN, Tinnie GensJeffrey Date/Time Lamount Cohen(Eastern Time): 11/03/2016 9:45:09 AM Confirm and document reason for call. If symptomatic, describe symptoms. ---Caller states, she is having anxiety and chest pain. Anxiety symptoms for the past month. Xanax 0.25 as needed for symptoms. Out of medication for a few days. Does the patient have any new or worsening symptoms? ---Yes Will a triage be completed? ---Yes Related visit to physician within the last 2 weeks? ---No Does the PT have any chronic conditions? (i.e. diabetes, asthma, etc.) ---Yes List chronic conditions. ---Anxiety Is the patient pregnant or possibly pregnant? (Ask all females between the ages of 8112-55) ---No Is this a behavioral health or substance abuse call? ---No Guidelines Guideline Title Affirmed Question Affirmed Notes Anxiety and Panic Attack Symptoms interfere with work or school Final Disposition User See Physician within 24 Hours [place], Charity fundraiserN, BB&T CorporationJeffrey Comments Caller did not want to be seen by another provider and wanted to know if her PCP could work her in today or just call in a Rx for her meds until she could be seen. I advised that I could send a message to her provider to see if that would be possible. Referrals REFERRED TO PCP OFFICE Disagree/Comply: Comply

## 2016-11-03 NOTE — Telephone Encounter (Signed)
Spoke with patient regarding symptoms. Reports she had an anxiety attack this morning which caused chest pain. Denies shortness of breath or diaphoresis. Currently is not experiencing chest pain. Patient requesting an appointment with Malva Coganody Martin, PA-C for refills of anxiety and ADHD medications. Appointment scheduled for today at 11:30 (approved by Malva Coganody Martin, PA-C).

## 2016-11-03 NOTE — Telephone Encounter (Signed)
I have an 11:30 appointment available if she can come in at that time. If not, she needs to see someone else within [facility] today.

## 2016-11-03 NOTE — Telephone Encounter (Signed)
Patient initially called office to request to schedule follow up appt with pcp today for med check.  I advised patient that pcp did not have any available appts today but I could get her in on Monday.    Patient declined appt on Monday, then asks if pcp would be willing to refill rx for amphetamine-dextroamphetamine (ADDERALL XR) 10 MG 24 hr capsule & ALPRAZolam (XANAX) 0.25 MG tablet.  I advised her that I would send a message back to pcp to ask.  After telling patient this she proceeds to tell me that she had an anxiety attach this morning which has been causing her chest pain.  I then advised patient I would need to let her speak with a triage nurse since she states she is experiencing chest pain.  She replied with a laugh and stated she knows it's not cardiac related but ok, she would speak with the nurse.  Call was transferred to [facility] for triage.

## 2016-11-03 NOTE — Progress Notes (Signed)
Patient presents to clinic today c/o increased acute anxiety over the past few weeks secondary to significant stressors at home and at work. Patient is involved in court battles regarding children at present. Is affecting sleep. Noted panic attack this morning with increased heart rate, chest tightness and tenderness. She was able to calm herself down with symptom resolution. Denies symptoms at present. Has been taking Xanax twice daily as opposed to PRN previously. Is out of medication. Has been on multiple SSRI and SNRI previously with bad side effect. Is hesitant to start new medication.  Past Medical History:  Diagnosis Date  . ADHD (attention deficit hyperactivity disorder)   . Allergy   . Anemia   . Anxiety    H/O  . Carpal tunnel syndrome    bilateral  . Chest pain    H/O - no current problems - anxiety related  . Chronic fatigue   . Depression    Gestational  . Dyspnea   . GERD (gastroesophageal reflux disease)   . History of chickenpox   . HSV (herpes simplex virus) infection   . Hx of pyelonephritis   . Migraine   . Palpitations    H/O - no current problems-anxiety related  . PONV (postoperative nausea and vomiting)   . Postpartum care following vaginal delivery (2/13) 06/21/2016  . PTSD (post-traumatic stress disorder)   . Smoker   . Spontaneous vaginal delivery 06/21/2016   svd x 3  . Vaginal Pap smear, abnormal     Current Outpatient Prescriptions on File Prior to Visit  Medication Sig Dispense Refill  . albuterol (PROAIR HFA) 108 (90 Base) MCG/ACT inhaler Inhale 2 puffs into the lungs every 6 (six) hours as needed. 8.5 Inhaler 0  . benzocaine-Menthol (DERMOPLAST) 20-0.5 % AERO Apply 1 application topically as needed for irritation (perineal discomfort).    . butalbital-acetaminophen-caffeine (FIORICET, ESGIC) 50-325-40 MG tablet Take 1 tablet by mouth 2 (two) times daily as needed for headache. 10 tablet 0  . cyclobenzaprine (FLEXERIL) 5 MG tablet Take 1  tablet (5 mg total) by mouth 3 (three) times daily as needed (migraines/tension headaches). 90 tablet 0  . ibuprofen (ADVIL,MOTRIN) 800 MG tablet Take 1 tablet (800 mg total) by mouth every 8 (eight) hours as needed for cramping. 60 tablet 0  . ondansetron (ZOFRAN) 4 MG tablet Take 1 tablet (4 mg total) by mouth every 8 (eight) hours as needed for nausea or vomiting. 20 tablet 0  . Prenatal Vit-Fe Fumarate-FA (PRENATAL MULTIVITAMIN) TABS tablet Take 1 tablet by mouth daily at 12 noon.    . ranitidine (ZANTAC) 150 MG tablet Take 1 tablet as needed for heartburn    . valACYclovir (VALTREX) 500 MG tablet Take 1 tablet as needed for outbreak     No current facility-administered medications on file prior to visit.     Allergies  Allergen Reactions  . Effexor [Venlafaxine]     Multiple somatic issues  . Lexapro [Escitalopram Oxalate]     Mania   . Paxil [Paroxetine Hcl]     Feels detached   . Penicillins Hives    Has patient had a PCN reaction causing immediate rash, facial/tongue/throat swelling, SOB or lightheadedness with hypotension: no Has patient had a PCN reaction causing severe rash involving mucus membranes or skin necrosis: yes Has patient had a PCN reaction that required hospitalization no Has patient had a PCN reaction occurring within the last 10 years: no If all of the above answers are "NO", then may  proceed with Cephalosporin use.   . Sulfa Antibiotics   . Vicodin [Hydrocodone-Acetaminophen] Nausea And Vomiting    Projectile vomiting  . Wellbutrin [Bupropion]     Became belligerant  . Zoloft [Sertraline Hcl] Hives    rash  . Differin [Adapalene] Rash  . Latex Rash    Family History  Problem Relation Age of Onset  . Coronary artery disease Mother 36  . Hypertension Mother   . Diabetes Mother   . Heart disease Mother   . Kidney Stones Mother   . Asthma Mother   . Kidney disease Mother   . Hyperthyroidism Mother   . COPD Mother   . Heart attack Mother   .  Dementia Mother   . Other Father        Shot-violence  . Alcohol abuse Father   . Hypertension Brother   . Kidney Stones Brother   . Hyperlipidemia Brother   . COPD Brother   . Heart attack Brother   . Diabetes Brother   . Hypertension Brother   . Hypertension Maternal Grandmother   . Hyperlipidemia Maternal Grandmother   . Cancer Maternal Grandmother        Skin  . COPD Maternal Grandmother   . Diabetes Maternal Grandmother   . Cancer Maternal Grandfather        Bone  . Cancer Paternal Grandmother   . Stroke Paternal Grandmother   . Aneurysm Paternal Grandfather   . Hypertension Other   . Diabetes Other   . Asthma Son   . Hypertension Maternal Aunt   . COPD Maternal Aunt   . Diabetes Maternal Aunt   . Heart attack Maternal Aunt   . Cancer Paternal Aunt        Breast    Social History   Social History  . Marital status: Married    Spouse name: N/A  . Number of children: 3  . Years of education: 4   Occupational History  . nusre  Other   Social History Main Topics  . Smoking status: Current Every Day Smoker    Packs/day: 0.50    Years: 20.00    Types: Cigarettes  . Smokeless tobacco: Never Used  . Alcohol use No  . Drug use: No  . Sexual activity: Yes    Birth control/ protection: None   Other Topics Concern  . None   Social History Narrative  . None   Review of Systems - See HPI.  All other ROS are negative.  BP 110/80   Pulse 98   Temp 98.3 F (36.8 C) (Oral)   Resp 14   Ht 5\' 7"  (1.702 m)   Wt 169 lb (76.7 kg)   SpO2 99%   BMI 26.47 kg/m   Physical Exam  Constitutional: She is oriented to person, place, and time and well-developed, well-nourished, and in no distress.  HENT:  Head: Normocephalic and atraumatic.  Eyes: Conjunctivae are normal.  Neck: Neck supple.  Cardiovascular: Normal rate, regular rhythm, normal heart sounds and intact distal pulses.   Pulmonary/Chest: Effort normal and breath sounds normal. No respiratory distress.  She has no wheezes. She has no rales. She exhibits no tenderness.  Neurological: She is alert and oriented to person, place, and time.  Skin: Skin is warm and dry. No rash noted.  Psychiatric: Affect normal.  Vitals reviewed.   Recent Results (from the past 2160 hour(s))  CBC with Differential/Platelet     Status: None   Collection Time: 08/07/16 11:26 AM  Result Value Ref Range   WBC 9.2 4.0 - 10.5 K/uL   RBC 4.31 3.87 - 5.11 Mil/uL   Hemoglobin 13.6 12.0 - 15.0 g/dL   HCT 16.1 09.6 - 04.5 %   MCV 95.3 78.0 - 100.0 fl   MCHC 33.0 30.0 - 36.0 g/dL   RDW 40.9 81.1 - 91.4 %   Platelets 315.0 150.0 - 400.0 K/uL   Neutrophils Relative % 69.2 43.0 - 77.0 %   Lymphocytes Relative 20.5 12.0 - 46.0 %   Monocytes Relative 6.6 3.0 - 12.0 %   Eosinophils Relative 2.9 0.0 - 5.0 %   Basophils Relative 0.8 0.0 - 3.0 %   Neutro Abs 6.4 1.4 - 7.7 K/uL   Lymphs Abs 1.9 0.7 - 4.0 K/uL   Monocytes Absolute 0.6 0.1 - 1.0 K/uL   Eosinophils Absolute 0.3 0.0 - 0.7 K/uL   Basophils Absolute 0.1 0.0 - 0.1 K/uL  Comprehensive metabolic panel     Status: None   Collection Time: 08/07/16 11:26 AM  Result Value Ref Range   Sodium 140 135 - 145 mEq/L   Potassium 4.2 3.5 - 5.1 mEq/L   Chloride 106 96 - 112 mEq/L   CO2 29 19 - 32 mEq/L   Glucose, Bld 94 70 - 99 mg/dL   BUN 8 6 - 23 mg/dL   Creatinine, Ser 7.82 0.40 - 1.20 mg/dL   Total Bilirubin 0.4 0.2 - 1.2 mg/dL   Alkaline Phosphatase 68 39 - 117 U/L   AST 16 0 - 37 U/L   ALT 21 0 - 35 U/L   Total Protein 6.3 6.0 - 8.3 g/dL   Albumin 4.0 3.5 - 5.2 g/dL   Calcium 9.1 8.4 - 95.6 mg/dL   GFR 213.08 >65.78 mL/min  Hemoglobin A1c     Status: None   Collection Time: 08/07/16 11:26 AM  Result Value Ref Range   Hgb A1c MFr Bld 5.8 4.6 - 6.5 %    Comment: Glycemic Control Guidelines for People with Diabetes:Non Diabetic:  <6%Goal of Therapy: <7%Additional Action Suggested:  >8%   Lipid panel     Status: Abnormal   Collection Time: 08/07/16 11:26 AM    Result Value Ref Range   Cholesterol 181 0 - 200 mg/dL    Comment: ATP III Classification       Desirable:  < 200 mg/dL               Borderline High:  200 - 239 mg/dL          High:  > = 469 mg/dL   Triglycerides 629.5 0.0 - 149.0 mg/dL    Comment: Normal:  <284 mg/dLBorderline High:  150 - 199 mg/dL   HDL 13.24 >40.10 mg/dL   VLDL 27.2 0.0 - 53.6 mg/dL   LDL Cholesterol 644 (H) 0 - 99 mg/dL   Total CHOL/HDL Ratio 4     Comment:                Men          Women1/2 Average Risk     3.4          3.3Average Risk          5.0          4.42X Average Risk          9.6          7.13X Average Risk          15.0  11.0                       NonHDL 137.42     Comment: NOTE:  Non-HDL goal should be 30 mg/dL higher than patient's LDL goal (i.e. LDL goal of < 70 mg/dL, would have non-HDL goal of < 100 mg/dL)  TSH     Status: None   Collection Time: 08/07/16 11:26 AM  Result Value Ref Range   TSH 0.46 0.35 - 4.50 uIU/mL  Urinalysis, Routine w reflex microscopic     Status: None   Collection Time: 08/07/16 11:26 AM  Result Value Ref Range   Color, Urine YELLOW Yellow;Lt. Yellow   APPearance CLEAR Clear   Specific Gravity, Urine 1.010 1.000 - 1.030   pH 7.5 5.0 - 8.0   Total Protein, Urine NEGATIVE Negative   Urine Glucose NEGATIVE Negative   Ketones, ur NEGATIVE Negative   Bilirubin Urine NEGATIVE Negative   Hgb urine dipstick NEGATIVE Negative   Urobilinogen, UA 0.2 0.0 - 1.0   Leukocytes, UA NEGATIVE Negative   Nitrite NEGATIVE Negative   WBC, UA none seen 0-2/hpf   RBC / HPF none seen 0-2/hpf    Assessment/Plan: Anxiety Vitals stable. Exam unremarkable. Symptoms related to significant acute anxiety. Patient has resumed counseling. Continue as directed. Declines starting new medication. Medication refill of Alprazolam given. Take as directed. Strict return precautions and ER precautions reviewed with patient.     Piedad Climes, PA-C

## 2016-11-03 NOTE — Telephone Encounter (Signed)
Patient was scheduled at 11:30am by nurse, Selena BattenKim.

## 2016-11-03 NOTE — Progress Notes (Signed)
Pre visit review using our clinic review tool, if applicable. No additional management support is needed unless otherwise documented below in the visit note. 

## 2016-11-03 NOTE — Assessment & Plan Note (Signed)
Vitals stable. Exam unremarkable. Symptoms related to significant acute anxiety. Patient has resumed counseling. Continue as directed. Declines starting new medication. Medication refill of Alprazolam given. Take as directed. Strict return precautions and ER precautions reviewed with patient.

## 2017-02-26 ENCOUNTER — Encounter: Payer: Self-pay | Admitting: Physician Assistant

## 2017-02-26 ENCOUNTER — Ambulatory Visit (INDEPENDENT_AMBULATORY_CARE_PROVIDER_SITE_OTHER): Payer: 59 | Admitting: Physician Assistant

## 2017-02-26 VITALS — BP 102/60 | HR 98 | Temp 98.3°F | Resp 14 | Ht 67.0 in | Wt 163.0 lb

## 2017-02-26 DIAGNOSIS — J208 Acute bronchitis due to other specified organisms: Secondary | ICD-10-CM

## 2017-02-26 DIAGNOSIS — B9689 Other specified bacterial agents as the cause of diseases classified elsewhere: Secondary | ICD-10-CM

## 2017-02-26 MED ORDER — BENZONATATE 100 MG PO CAPS: 100.0000 mg | ORAL_CAPSULE | Freq: Two times a day (BID) | ORAL | 0 refills | Status: DC | PRN

## 2017-02-26 MED ORDER — AZITHROMYCIN 250 MG PO TABS: ORAL_TABLET | ORAL | 0 refills | Status: DC

## 2017-02-26 NOTE — Progress Notes (Signed)
Patient presents to clinic today c/o 1 week of URI symptoms, starting out at cough and body aches. Since then has noted symptoms moved into her chest. Notes significant chest congestion and cough productive of thick yellow sputum. Also notes ear fullness and nasal congestion. Denies sinus tenderness, chest pain. Noted mild wheezing this morning. Has been hydrating well.Has taken Mucinex, Robitussin-DM and Cold-eeze.   Past Medical History:  Diagnosis Date  . ADHD (attention deficit hyperactivity disorder)   . Allergy   . Anemia   . Anxiety    H/O  . Carpal tunnel syndrome    bilateral  . Chest pain    H/O - no current problems - anxiety related  . Chronic fatigue   . Depression    Gestational  . Dyspnea   . GERD (gastroesophageal reflux disease)   . History of chickenpox   . HSV (herpes simplex virus) infection   . Hx of pyelonephritis   . Migraine   . Palpitations    H/O - no current problems-anxiety related  . PONV (postoperative nausea and vomiting)   . Postpartum care following vaginal delivery (2/13) 06/21/2016  . PTSD (post-traumatic stress disorder)   . Smoker   . Spontaneous vaginal delivery 06/21/2016   svd x 3  . Vaginal Pap smear, abnormal     Current Outpatient Prescriptions on File Prior to Visit  Medication Sig Dispense Refill  . ALPRAZolam (XANAX) 0.25 MG tablet Take 1 tablet (0.25 mg total) by mouth 2 (two) times daily as needed for anxiety. 60 tablet 0  . butalbital-acetaminophen-caffeine (FIORICET, ESGIC) 50-325-40 MG tablet Take 1 tablet by mouth 2 (two) times daily as needed for headache. 10 tablet 0  . cyclobenzaprine (FLEXERIL) 5 MG tablet Take 1 tablet (5 mg total) by mouth 3 (three) times daily as needed (migraines/tension headaches). 90 tablet 0  . ibuprofen (ADVIL,MOTRIN) 800 MG tablet Take 1 tablet (800 mg total) by mouth every 8 (eight) hours as needed for cramping. 60 tablet 0  . ondansetron (ZOFRAN) 4 MG tablet Take 1 tablet (4 mg total) by  mouth every 8 (eight) hours as needed for nausea or vomiting. 20 tablet 0  . valACYclovir (VALTREX) 500 MG tablet Take 1 tablet as needed for outbreak    . ADDERALL XR 10 MG 24 hr capsule Take 1 capsule (10 mg total) by mouth daily. (Patient not taking: Reported on 02/26/2017) 30 capsule 0  . albuterol (PROAIR HFA) 108 (90 Base) MCG/ACT inhaler Inhale 2 puffs into the lungs every 6 (six) hours as needed. (Patient not taking: Reported on 02/26/2017) 8.5 Inhaler 0   No current facility-administered medications on file prior to visit.     Allergies  Allergen Reactions  . Effexor [Venlafaxine]     Multiple somatic issues  . Lexapro [Escitalopram Oxalate]     Mania   . Paxil [Paroxetine Hcl]     Feels detached   . Penicillins Hives    Has patient had a PCN reaction causing immediate rash, facial/tongue/throat swelling, SOB or lightheadedness with hypotension: no Has patient had a PCN reaction causing severe rash involving mucus membranes or skin necrosis: yes Has patient had a PCN reaction that required hospitalization no Has patient had a PCN reaction occurring within the last 10 years: no If all of the above answers are "NO", then may proceed with Cephalosporin use.   . Sulfa Antibiotics   . Vicodin [Hydrocodone-Acetaminophen] Nausea And Vomiting    Projectile vomiting  . Wellbutrin [Bupropion]  Became belligerant  . Zoloft [Sertraline Hcl] Hives    rash  . Differin [Adapalene] Rash  . Latex Rash    Family History  Problem Relation Age of Onset  . Coronary artery disease Mother [id number]  . Hypertension Mother   . Diabetes Mother   . Heart disease Mother   . Kidney Stones Mother   . Asthma Mother   . Kidney disease Mother   . Hyperthyroidism Mother   . COPD Mother   . Heart attack Mother   . Dementia Mother   . Other Father        Shot-violence  . Alcohol abuse Father   . Hypertension Brother   . Kidney Stones Brother   . Hyperlipidemia Brother   . COPD Brother   .  Heart attack Brother   . Diabetes Brother   . Hypertension Brother   . Hypertension Maternal Grandmother   . Hyperlipidemia Maternal Grandmother   . Cancer Maternal Grandmother        Skin  . COPD Maternal Grandmother   . Diabetes Maternal Grandmother   . Cancer Maternal Grandfather        Bone  . Cancer Paternal Grandmother   . Stroke Paternal Grandmother   . Aneurysm Paternal Grandfather   . Hypertension Other   . Diabetes Other   . Asthma Son   . Hypertension Maternal Aunt   . COPD Maternal Aunt   . Diabetes Maternal Aunt   . Heart attack Maternal Aunt   . Cancer Paternal Aunt        Breast    Social History   Social History  . Marital status: Married    Spouse name: N/A  . Number of children: 3  . Years of education: 4   Occupational History  . nusre  Other   Social History Main Topics  . Smoking status: Current Every Day Smoker    Packs/day: 0.50    Years: 20.00    Types: Cigarettes  . Smokeless tobacco: Never Used  . Alcohol use No  . Drug use: No  . Sexual activity: Yes    Birth control/ protection: None   Other Topics Concern  . None   Social History Narrative  . None    Review of Systems - See HPI.  All other ROS are negative.  BP 102/60   Pulse 98   Temp 98.3 F (36.8 C) (Oral)   Resp 14   Ht 5\' 7"  (1.702 m)   Wt 163 lb (73.9 kg)   SpO2 98%   BMI 25.53 kg/m   Physical Exam  Constitutional: She is oriented to person, place, and time and well-developed, well-nourished, and in no distress.  HENT:  Head: Normocephalic and atraumatic.  Eyes: Conjunctivae are normal.  Neck: Neck supple.  Cardiovascular: Normal rate, regular rhythm, normal heart sounds and intact distal pulses.   Pulmonary/Chest: Effort normal and breath sounds normal. No respiratory distress. She has no wheezes. She has no rales. She exhibits no tenderness.  Neurological: She is alert and oriented to person, place, and time.  Skin: Skin is warm and dry. No rash noted.    Psychiatric: Affect normal.  Vitals reviewed.   Assessment/Plan: 1. Acute bacterial bronchitis Rx Azithromycin and Tessalon.  Increase fluids.  Rest.  Saline nasal spray.  Probiotic.  Mucinex as directed.  Humidifier in bedroom.  Call or return to clinic if symptoms are not improving.  - benzonatate (TESSALON) 100 MG capsule; Take 1 capsule (100 mg total)  by mouth 2 (two) times daily as needed for cough.  Dispense: 20 capsule; Refill: 0 - azithromycin (ZITHROMAX) 250 MG tablet; Patient take 2 tablets on Day 1. Then take 1 tablet daily.  Dispense: 6 tablet; Refill: 0   Piedad Climes, [place]

## 2017-02-26 NOTE — Progress Notes (Signed)
Pre visit review using our clinic review tool, if applicable. No additional management support is needed unless otherwise documented below in the visit note. 

## 2017-02-26 NOTE — Patient Instructions (Signed)
Take antibiotic (Azithromycin) as directed.  Increase fluids.  Get plenty of rest. Use Mucinex for congestion. Start Tessalon for cough. Take a daily probiotic (I recommend Align or Culturelle, but even Activia Yogurt may be beneficial).  A humidifier placed in the bedroom may offer some relief for a dry, scratchy throat of nasal irritation.  Read information below on acute bronchitis. Please call or return to clinic if symptoms are not improving.  Acute Bronchitis Bronchitis is when the airways that extend from the windpipe into the lungs get red, puffy, and painful (inflamed). Bronchitis often causes thick spit (mucus) to develop. This leads to a cough. A cough is the most common symptom of bronchitis. In acute bronchitis, the condition usually begins suddenly and goes away over time (usually in 2 weeks). Smoking, allergies, and asthma can make bronchitis worse. Repeated episodes of bronchitis may cause more lung problems.  HOME CARE  Rest.  Drink enough fluids to keep your pee (urine) clear or pale yellow (unless you need to limit fluids as told by your doctor).  Only take over-the-counter or prescription medicines as told by your doctor.  Avoid smoking and secondhand smoke. These can make bronchitis worse. If you are a smoker, think about using nicotine gum or skin patches. Quitting smoking will help your lungs heal faster.  Reduce the chance of getting bronchitis again by:  Washing your hands often.  Avoiding people with cold symptoms.  Trying not to touch your hands to your mouth, nose, or eyes.  Follow up with your doctor as told.  GET HELP IF: Your symptoms do not improve after 1 week of treatment. Symptoms include:  Cough.  Fever.  Coughing up thick spit.  Body aches.  Chest congestion.  Chills.  Shortness of breath.  Sore throat.  GET HELP RIGHT AWAY IF:   You have an increased fever.  You have chills.  You have severe shortness of breath.  You have  bloody thick spit (sputum).  You throw up (vomit) often.  You lose too much body fluid (dehydration).  You have a severe headache.  You faint.  MAKE SURE YOU:   Understand these instructions.  Will watch your condition.  Will get help right away if you are not doing well or get worse. Document Released: 10/11/2007 Document Revised: 12/25/2012 Document Reviewed: 10/15/2012 [facility] Patient Information 2015 [place], [place]. This information is not intended to replace advice given to you by your health care provider. Make sure you discuss any questions you have with your health care provider.

## 2017-04-19 DIAGNOSIS — R1031 Right lower quadrant pain: Secondary | ICD-10-CM | POA: Diagnosis not present

## 2017-06-08 DIAGNOSIS — B349 Viral infection, unspecified: Secondary | ICD-10-CM | POA: Diagnosis not present

## 2017-06-22 ENCOUNTER — Encounter: Payer: Self-pay | Admitting: Physician Assistant

## 2017-06-22 ENCOUNTER — Other Ambulatory Visit: Payer: Self-pay

## 2017-06-22 ENCOUNTER — Ambulatory Visit: Payer: 59 | Admitting: Physician Assistant

## 2017-06-22 VITALS — BP 104/60 | HR 103 | Temp 98.5°F | Resp 16 | Ht 67.0 in | Wt 165.0 lb

## 2017-06-22 DIAGNOSIS — F988 Other specified behavioral and emotional disorders with onset usually occurring in childhood and adolescence: Secondary | ICD-10-CM | POA: Diagnosis not present

## 2017-06-22 DIAGNOSIS — F419 Anxiety disorder, unspecified: Secondary | ICD-10-CM | POA: Diagnosis not present

## 2017-06-22 DIAGNOSIS — G43909 Migraine, unspecified, not intractable, without status migrainosus: Secondary | ICD-10-CM

## 2017-06-22 MED ORDER — BUTALBITAL-APAP-CAFFEINE 50-325-40 MG PO TABS: 1.0000 | ORAL_TABLET | Freq: Two times a day (BID) | ORAL | 0 refills | Status: DC | PRN

## 2017-06-22 MED ORDER — ALBUTEROL SULFATE HFA 108 (90 BASE) MCG/ACT IN AERS: 2.0000 | INHALATION_SPRAY | Freq: Four times a day (QID) | RESPIRATORY_TRACT | 0 refills | Status: DC | PRN

## 2017-06-22 MED ORDER — AMPHETAMINE-DEXTROAMPHET ER 20 MG PO CP24: 20.0000 mg | ORAL_CAPSULE | Freq: Every day | ORAL | 0 refills | Status: DC

## 2017-06-22 MED ORDER — ADDERALL XR 10 MG PO CP24: 10.0000 mg | ORAL_CAPSULE | Freq: Every day | ORAL | 0 refills | Status: DC

## 2017-06-22 MED ORDER — ALPRAZOLAM 0.25 MG PO TABS: 0.2500 mg | ORAL_TABLET | Freq: Two times a day (BID) | ORAL | 0 refills | Status: DC | PRN

## 2017-06-22 NOTE — Patient Instructions (Signed)
Please restart medications as directed.  Follow-up with me in 1 month.  I would recommend we do your physical at that time.   Return sooner if needed.

## 2017-06-23 LAB — PAIN MGMT, PROFILE 8 W/CONF, U
6 Acetylmorphine: NEGATIVE ng/mL (ref <10)
Alcohol Metabolites: NEGATIVE ng/mL (ref <500)
Amphetamines: NEGATIVE ng/mL (ref <500)
Benzodiazepines: NEGATIVE ng/mL (ref <100)
Buprenorphine, Urine: NEGATIVE ng/mL (ref <5)
Cocaine Metabolite: NEGATIVE ng/mL (ref <150)
Creatinine: 110.1 mg/dL
MDMA: NEGATIVE ng/mL (ref <500)
Marijuana Metabolite: NEGATIVE ng/mL (ref <20)
Opiates: NEGATIVE ng/mL (ref <100)
Oxidant: NEGATIVE ug/mL (ref <200)
Oxycodone: NEGATIVE ng/mL (ref <100)
pH: 5.73 (ref 4.5–9.0)

## 2017-06-27 NOTE — Progress Notes (Signed)
Patient presents to clinic today for medication management. Patient with history of migraines, endorsing several headaches per month. Notes being on multiple preventative medications including TCA and Propranolol with side effects. States she will typically take Excedrin with resolution of migraine. Sometimes will have to use Fiorinal but is on a rare basis.   Patient also previously on PRN dose of Alprazolam for acute anxiety. Has been doing very well with anxiety and has not needed but notes recently with a new position at work that she is sometimes getting very anxious. States this may happen once a week when she has to take on leadership roles at work. Otherwise doing very well. Denies depressed mood or anhedonia.   Patient also needing refill of ADD medications today.  Past Medical History:  Diagnosis Date  . ADHD (attention deficit hyperactivity disorder)   . Allergy   . Anemia   . Anxiety    H/O  . Carpal tunnel syndrome    bilateral  . Chest pain    H/O - no current problems - anxiety related  . Chronic fatigue   . Depression    Gestational  . Dyspnea   . GERD (gastroesophageal reflux disease)   . History of chickenpox   . HSV (herpes simplex virus) infection   . Hx of pyelonephritis   . Migraine   . Palpitations    H/O - no current problems-anxiety related  . PONV (postoperative nausea and vomiting)   . Postpartum care following vaginal delivery (2/13) 06/21/2016  . PTSD (post-traumatic stress disorder)   . Smoker   . Spontaneous vaginal delivery 06/21/2016   svd x 3  . Vaginal Pap smear, abnormal     Current Outpatient Medications on File Prior to Visit  Medication Sig Dispense Refill  . ibuprofen (ADVIL,MOTRIN) 800 MG tablet Take 1 tablet (800 mg total) by mouth every 8 (eight) hours as needed for cramping. 60 tablet 0  . ondansetron (ZOFRAN) 4 MG tablet Take 1 tablet (4 mg total) by mouth every 8 (eight) hours as needed for nausea or vomiting. 20 tablet 0  .  valACYclovir (VALTREX) 500 MG tablet Take 1 tablet as needed for outbreak    . cyclobenzaprine (FLEXERIL) 5 MG tablet Take 1 tablet (5 mg total) by mouth 3 (three) times daily as needed (migraines/tension headaches). (Patient not taking: Reported on 06/22/2017) 90 tablet 0   No current facility-administered medications on file prior to visit.     Allergies  Allergen Reactions  . Effexor [Venlafaxine]     Multiple somatic issues  . Lexapro [Escitalopram Oxalate]     Mania   . Paxil [Paroxetine Hcl]     Feels detached   . Penicillins Hives    Has patient had a PCN reaction causing immediate rash, facial/tongue/throat swelling, SOB or lightheadedness with hypotension: no Has patient had a PCN reaction causing severe rash involving mucus membranes or skin necrosis: yes Has patient had a PCN reaction that required hospitalization no Has patient had a PCN reaction occurring within the last 10 years: no If all of the above answers are "NO", then may proceed with Cephalosporin use.   . Sulfa Antibiotics   . Vicodin [Hydrocodone-Acetaminophen] Nausea And Vomiting    Projectile vomiting  . Wellbutrin [Bupropion]     Became belligerant  . Zoloft [Sertraline Hcl] Hives    rash  . Differin [Adapalene] Rash  . Latex Rash    Family History  Problem Relation Age of Onset  . Coronary artery  disease Mother 555  . Hypertension Mother   . Diabetes Mother   . Heart disease Mother   . Kidney Stones Mother   . Asthma Mother   . Kidney disease Mother   . Hyperthyroidism Mother   . COPD Mother   . Heart attack Mother   . Dementia Mother   . Other Father        Shot-violence  . Alcohol abuse Father   . Hypertension Brother   . Kidney Stones Brother   . Hyperlipidemia Brother   . COPD Brother   . Heart attack Brother   . Diabetes Brother   . Hypertension Brother   . Hypertension Maternal Grandmother   . Hyperlipidemia Maternal Grandmother   . Cancer Maternal Grandmother        Skin  .  COPD Maternal Grandmother   . Diabetes Maternal Grandmother   . Cancer Maternal Grandfather        Bone  . Cancer Paternal Grandmother   . Stroke Paternal Grandmother   . Aneurysm Paternal Grandfather   . Hypertension Other   . Diabetes Other   . Asthma Son   . Hypertension Maternal Aunt   . COPD Maternal Aunt   . Diabetes Maternal Aunt   . Heart attack Maternal Aunt   . Cancer Paternal Aunt        Breast    Social History   Socioeconomic History  . Marital status: Married    Spouse name: None  . Number of children: 3  . Years of education: 4  . Highest education level: None  Social Needs  . Financial resource strain: None  . Food insecurity - worry: None  . Food insecurity - inability: None  . Transportation needs - medical: None  . Transportation needs - non-medical: None  Occupational History  . Occupation: nusre     Employer: OTHER  Tobacco Use  . Smoking status: Current Every Day Smoker    Packs/day: 0.50    Years: 20.00    Pack years: 10.00    Types: Cigarettes  . Smokeless tobacco: Never Used  Substance and Sexual Activity  . Alcohol use: No  . Drug use: No  . Sexual activity: Yes    Birth control/protection: None  Other Topics Concern  . None  Social History Narrative  . None   Review of Systems - See HPI.  All other ROS are negative.  BP 104/60   Pulse (!) 103   Temp 98.5 F (36.9 C) (Oral)   Resp 16   Ht 5\' 7"  (1.702 m)   Wt 165 lb (74.8 kg)   SpO2 98%   BMI 25.84 kg/m   Physical Exam  Constitutional: She is oriented to person, place, and time and well-developed, well-nourished, and in no distress.  HENT:  Head: Normocephalic and atraumatic.  Right Ear: External ear normal.  Left Ear: External ear normal.  Nose: Nose normal.  Mouth/Throat: Oropharynx is clear and moist. No oropharyngeal exudate.  Eyes: Conjunctivae are normal.  Neck: Neck supple.  Cardiovascular: Normal rate, regular rhythm, normal heart sounds and intact distal  pulses.  Pulmonary/Chest: Effort normal and breath sounds normal. No respiratory distress. She has no wheezes. She has no rales. She exhibits no tenderness.  Neurological: She is alert and oriented to person, place, and time.  Skin: Skin is warm and dry. No rash noted.  Psychiatric: Affect normal.  Vitals reviewed.  Assessment/Plan: 1. Anxiety Medication refilled. Will check UDS today. - ALPRAZolam (XANAX) 0.25  MG tablet; Take 1 tablet (0.25 mg total) by mouth 2 (two) times daily as needed for anxiety.  Dispense: 20 tablet; Refill: 0 - Pain Mgmt, Profile 8 w/Conf, U  2. Attention deficit disorder, unspecified hyperactivity presence Restart medication at 20 mg XR Adderall. Follow-up 1 month. CSC up-to-date. UDS today. - Pain Mgmt, Profile 8 w/Conf, U - amphetamine-dextroamphetamine (ADDERALL XR) 20 MG 24 hr capsule; Take 1 capsule (20 mg total) by mouth daily.  Dispense: 30 capsule; Refill: 0  3. Migraine without status migrainosus, not intractable, unspecified migraine type Medication refilled. Reviewed preventative medications but patient declines. - butalbital-acetaminophen-caffeine (FIORICET, ESGIC) 50-325-40 MG tablet; Take 1 tablet by mouth 2 (two) times daily as needed for headache.  Dispense: 5 tablet; Refill: 0   Piedad Climes, PA-C

## 2017-07-17 ENCOUNTER — Encounter: Payer: 59 | Admitting: Physician Assistant

## 2017-07-24 ENCOUNTER — Other Ambulatory Visit: Payer: Self-pay

## 2017-07-24 ENCOUNTER — Ambulatory Visit (INDEPENDENT_AMBULATORY_CARE_PROVIDER_SITE_OTHER): Payer: 59 | Admitting: Physician Assistant

## 2017-07-24 ENCOUNTER — Encounter: Payer: 59 | Admitting: Physician Assistant

## 2017-07-24 ENCOUNTER — Encounter: Payer: Self-pay | Admitting: Physician Assistant

## 2017-07-24 VITALS — BP 100/64 | HR 89 | Temp 98.1°F | Resp 14 | Ht 67.0 in | Wt 165.4 lb

## 2017-07-24 DIAGNOSIS — Z0000 Encounter for general adult medical examination without abnormal findings: Secondary | ICD-10-CM

## 2017-07-24 DIAGNOSIS — E010 Iodine-deficiency related diffuse (endemic) goiter: Secondary | ICD-10-CM | POA: Insufficient documentation

## 2017-07-24 DIAGNOSIS — Z0001 Encounter for general adult medical examination with abnormal findings: Secondary | ICD-10-CM

## 2017-07-24 DIAGNOSIS — R599 Enlarged lymph nodes, unspecified: Secondary | ICD-10-CM | POA: Insufficient documentation

## 2017-07-24 LAB — COMPREHENSIVE METABOLIC PANEL
ALT: 13 U/L (ref 0–35)
AST: 12 U/L (ref 0–37)
Albumin: 3.9 g/dL (ref 3.5–5.2)
Alkaline Phosphatase: 51 U/L (ref 39–117)
BUN: 12 mg/dL (ref 6–23)
CO2: 26 mEq/L (ref 19–32)
Calcium: 8.9 mg/dL (ref 8.4–10.5)
Chloride: 106 mEq/L (ref 96–112)
Creatinine, Ser: 0.63 mg/dL (ref 0.40–1.20)
GFR: 113.38 mL/min (ref >60.00)
Glucose, Bld: 86 mg/dL (ref 70–99)
Potassium: 4.6 mEq/L (ref 3.5–5.1)
Sodium: 138 mEq/L (ref 135–145)
Total Bilirubin: 0.5 mg/dL (ref 0.2–1.2)
Total Protein: 6 g/dL (ref 6.0–8.3)

## 2017-07-24 LAB — CBC WITH DIFFERENTIAL/PLATELET
Basophils Absolute: 0.1 10*3/uL (ref 0.0–0.1)
Basophils Relative: 0.7 % (ref 0.0–3.0)
Eosinophils Absolute: 0.2 10*3/uL (ref 0.0–0.7)
Eosinophils Relative: 2.1 % (ref 0.0–5.0)
HCT: 41.8 % (ref 36.0–46.0)
Hemoglobin: 14.3 g/dL (ref 12.0–15.0)
Lymphocytes Relative: 25.7 % (ref 12.0–46.0)
Lymphs Abs: 2.3 10*3/uL (ref 0.7–4.0)
MCHC: 34.1 g/dL (ref 30.0–36.0)
MCV: 95.1 fl (ref 78.0–100.0)
Monocytes Absolute: 0.6 10*3/uL (ref 0.1–1.0)
Monocytes Relative: 6.4 % (ref 3.0–12.0)
Neutro Abs: 5.7 10*3/uL (ref 1.4–7.7)
Neutrophils Relative %: 65.1 % (ref 43.0–77.0)
Platelets: 297 10*3/uL (ref 150.0–400.0)
RBC: 4.4 Mil/uL (ref 3.87–5.11)
RDW: 13 % (ref 11.5–15.5)
WBC: 8.8 10*3/uL (ref 4.0–10.5)

## 2017-07-24 LAB — LIPID PANEL
Cholesterol: 172 mg/dL (ref 0–200)
HDL: 43.5 mg/dL (ref >39.00)
LDL Cholesterol: 114 mg/dL — ABNORMAL HIGH (ref 0–99)
NonHDL: 128.45
Total CHOL/HDL Ratio: 4
Triglycerides: 74 mg/dL (ref 0.0–149.0)
VLDL: 14.8 mg/dL (ref 0.0–40.0)

## 2017-07-24 LAB — TSH: TSH: 0.83 u[IU]/mL (ref 0.35–4.50)

## 2017-07-24 LAB — T4, FREE: Free T4: 0.72 ng/dL (ref 0.60–1.60)

## 2017-07-24 NOTE — Progress Notes (Signed)
Patient presents to clinic today for annual exam.  Patient is fasting for labs.  Acute Concerns: Patient has noted a knot of the R posterior neck that has been present for 1-2 months. Is non painful or pruritic. Denies change in size. Denies any decreased ROM of neck. States it feels mobile when she is messing with the area.   Patient also notes that her thyroid seems to be enlarged. Denies known nodule, pain or tenderness in the area.   Health Maintenance: Immunizations -- Flu and Tetanus are up-to-date. PAP -- Last 01/2017. + HPV. Has follow-up scheduled.   Past Medical History:  Diagnosis Date  . ADHD (attention deficit hyperactivity disorder)   . Allergy   . Anemia   . Anxiety    H/O  . Carpal tunnel syndrome    bilateral  . Chest pain    H/O - no current problems - anxiety related  . Chronic fatigue   . Depression    Gestational  . Dyspnea   . GERD (gastroesophageal reflux disease)   . History of chickenpox   . HSV (herpes simplex virus) infection   . Hx of pyelonephritis   . Migraine   . Palpitations    H/O - no current problems-anxiety related  . PONV (postoperative nausea and vomiting)   . Postpartum care following vaginal delivery (2/13) 06/21/2016  . PTSD (post-traumatic stress disorder)   . Smoker   . Spontaneous vaginal delivery 06/21/2016   svd x 3  . Vaginal Pap smear, abnormal     Past Surgical History:  Procedure Laterality Date  . LEEP  2005, 2013   x 2  . NASAL SINUS SURGERY  1999  . WISDOM TOOTH EXTRACTION      Current Outpatient Medications on File Prior to Visit  Medication Sig Dispense Refill  . albuterol (PROAIR HFA) 108 (90 Base) MCG/ACT inhaler Inhale 2 puffs into the lungs every 6 (six) hours as needed. 8.5 Inhaler 0  . ALPRAZolam (XANAX) 0.25 MG tablet Take 1 tablet (0.25 mg total) by mouth 2 (two) times daily as needed for anxiety. 20 tablet 0  . amphetamine-dextroamphetamine (ADDERALL XR) 20 MG 24 hr capsule Take 1 capsule (20 mg  total) by mouth daily. 30 capsule 0  . butalbital-acetaminophen-caffeine (FIORICET, ESGIC) 50-325-40 MG tablet Take 1 tablet by mouth 2 (two) times daily as needed for headache. 5 tablet 0  . ibuprofen (ADVIL,MOTRIN) 800 MG tablet Take 1 tablet (800 mg total) by mouth every 8 (eight) hours as needed for cramping. 60 tablet 0  . Levonorgestrel (SKYLA) 13.5 MG IUD by Intrauterine route.    . ondansetron (ZOFRAN) 4 MG tablet Take 1 tablet (4 mg total) by mouth every 8 (eight) hours as needed for nausea or vomiting. 20 tablet 0  . valACYclovir (VALTREX) 500 MG tablet Take 1 tablet as needed for outbreak    . cyclobenzaprine (FLEXERIL) 5 MG tablet Take 1 tablet (5 mg total) by mouth 3 (three) times daily as needed (migraines/tension headaches). (Patient not taking: Reported on 06/22/2017) 90 tablet 0   No current facility-administered medications on file prior to visit.     Allergies  Allergen Reactions  . Effexor [Venlafaxine]     Multiple somatic issues  . Lexapro [Escitalopram Oxalate]     Mania   . Paxil [Paroxetine Hcl]     Feels detached   . Penicillins Hives    Has patient had a PCN reaction causing immediate rash, facial/tongue/throat swelling, SOB or lightheadedness with  hypotension: no Has patient had a PCN reaction causing severe rash involving mucus membranes or skin necrosis: yes Has patient had a PCN reaction that required hospitalization no Has patient had a PCN reaction occurring within the last 10 years: no If all of the above answers are "NO", then may proceed with Cephalosporin use.   . Sulfa Antibiotics   . Vicodin [Hydrocodone-Acetaminophen] Nausea And Vomiting    Projectile vomiting  . Wellbutrin [Bupropion]     Became belligerant  . Zoloft [Sertraline Hcl] Hives    rash  . Differin [Adapalene] Rash  . Latex Rash    Family History  Problem Relation Age of Onset  . Coronary artery disease Mother 55  . Hypertension Mother   . Diabetes Mother   . Heart disease  Mother   . Kidney Stones Mother   . Asthma Mother   . Kidney disease Mother   . Hyperthyroidism Mother   . COPD Mother   . Heart attack Mother   . Dementia Mother   . Other Father        Shot-violence  . Alcohol abuse Father   . Hypertension Brother   . Kidney Stones Brother   . Hyperlipidemia Brother   . COPD Brother   . Heart attack Brother   . Diabetes Brother   . Hypertension Brother   . Hypertension Maternal Grandmother   . Hyperlipidemia Maternal Grandmother   . Cancer Maternal Grandmother        Skin  . COPD Maternal Grandmother   . Diabetes Maternal Grandmother   . Cancer Maternal Grandfather        Bone  . Cancer Paternal Grandmother   . Stroke Paternal Grandmother   . Aneurysm Paternal Grandfather   . Hypertension Other   . Diabetes Other   . Asthma Son   . Hypertension Maternal Aunt   . COPD Maternal Aunt   . Diabetes Maternal Aunt   . Heart attack Maternal Aunt   . Cancer Paternal Aunt        Breast    Social History   Socioeconomic History  . Marital status: Married    Spouse name: Not on file  . Number of children: 3  . Years of education: 4  . Highest education level: Not on file  Social Needs  . Financial resource strain: Not on file  . Food insecurity - worry: Not on file  . Food insecurity - inability: Not on file  . Transportation needs - medical: Not on file  . Transportation needs - non-medical: Not on file  Occupational History  . Occupation: nusre     Employer: OTHER  Tobacco Use  . Smoking status: Current Every Day Smoker    Packs/day: 0.50    Years: 20.00    Pack years: 10.00    Types: Cigarettes  . Smokeless tobacco: Never Used  Substance and Sexual Activity  . Alcohol use: No  . Drug use: No  . Sexual activity: Yes    Birth control/protection: None  Other Topics Concern  . Not on file  Social History Narrative  . Not on file   Review of Systems  Constitutional: Negative for fever and weight loss.  HENT: Negative  for ear discharge, ear pain, hearing loss and tinnitus.   Eyes: Negative for blurred vision, double vision, photophobia and pain.  Respiratory: Negative for cough and shortness of breath.   Cardiovascular: Negative for chest pain and palpitations.  Gastrointestinal: Negative for abdominal pain, blood in stool,  constipation, diarrhea, heartburn, melena, nausea and vomiting.  Genitourinary: Negative for dysuria, flank pain, frequency, hematuria and urgency.  Musculoskeletal: Negative for falls.  Neurological: Negative for dizziness, loss of consciousness and headaches.  Endo/Heme/Allergies: Negative for environmental allergies.  Psychiatric/Behavioral: Negative for depression, hallucinations, substance abuse and suicidal ideas. The patient is not nervous/anxious and does not have insomnia.    There were no vitals taken for this visit.  Physical Exam  Constitutional: She is oriented to person, place, and time and well-developed, well-nourished, and in no distress.  HENT:  Head: Normocephalic and atraumatic.  Right Ear: Tympanic membrane, external ear and ear canal normal.  Left Ear: Tympanic membrane, external ear and ear canal normal.  Nose: Nose normal. No mucosal edema.  Mouth/Throat: Uvula is midline, oropharynx is clear and moist and mucous membranes are normal. No oropharyngeal exudate or posterior oropharyngeal erythema.  Eyes: Conjunctivae are normal. Pupils are equal, round, and reactive to light.  Neck: Neck supple. Thyromegaly present. No thyroid mass present.  Cardiovascular: Normal rate, regular rhythm, normal heart sounds and intact distal pulses.  Pulmonary/Chest: Effort normal and breath sounds normal. No respiratory distress. She has no wheezes. She has no rales.  Abdominal: Soft. Bowel sounds are normal. She exhibits no distension and no mass. There is no tenderness. There is no rebound and no guarding.  Lymphadenopathy:       Head (right side): No submental, no  submandibular, no tonsillar, no preauricular, no posterior auricular and no occipital adenopathy present.       Head (left side): No submental, no submandibular, no tonsillar, no preauricular, no posterior auricular and no occipital adenopathy present.    She has cervical adenopathy.       Right cervical: Posterior cervical (very slight superficial node palpable. Is mobile. No induration noted.) adenopathy present. No superficial cervical and no deep cervical adenopathy present.      Left cervical: No superficial cervical, no deep cervical and no posterior cervical adenopathy present.       Right: No supraclavicular adenopathy present.       Left: No supraclavicular adenopathy present.  Neurological: She is alert and oriented to person, place, and time. No cranial nerve deficit.  Skin: Skin is warm and dry. No rash noted.  Psychiatric: Affect normal.  Vitals reviewed.   Recent Results (from the past 2160 hour(s))  Pain Mgmt, Profile 8 w/Conf, U     Status: None   Collection Time: 06/22/17  2:57 PM  Result Value Ref Range   Creatinine 110.1 > or = 20. mg/dL   pH 7.825.73 4.5 - 9.0   Oxidant NEGATIVE <200 mcg/mL   Amphetamines NEGATIVE <500 ng/mL   medMATCH Amphetamines CONSISTENT    Benzodiazepines NEGATIVE <100 ng/mL   medMATCH Benzodiazepines CONSISTENT    Marijuana Metabolite NEGATIVE <20 ng/mL   medMATCH Marijuana Metab CONSISTENT    Cocaine Metabolite NEGATIVE <150 ng/mL   medMATCH Cocaine Metab CONSISTENT    Opiates NEGATIVE <100 ng/mL   medMATCH Opiates CONSISTENT    Oxycodone NEGATIVE <100 ng/mL   medMATCH Oxycodone CONSISTENT    Buprenorphine, Urine NEGATIVE <5 ng/mL   medMATCH Buprenorphine CONSISTENT    MDMA NEGATIVE <500 ng/mL   [facility]    Alcohol Metabolites NEGATIVE <500 ng/mL   medMATCH Alcohol Metab CONSISTENT    6 Acetylmorphine NEGATIVE <10 ng/mL   medMATCH 6 Acetylmorphine CONSISTENT     Comment: This drug testing is for medical treatment only.     Analysis was performed as  non-forensic testing and  these results should be used only by healthcare  providers to render diagnosis or treatment, or to  monitor progress of medical conditions. Sharyn Lull comments are:  - present when drug test results may be the result of     metabolism of one or more drugs or when results are     inconsistent with prescribed medication(s) listed.  - may be blank when drug results are consistent with     prescribed medication(s) listed. . For assistance with interpreting these drug results,  please contact a [employer] Toxicology  Specialist: [phone] TOX [phone]), M-F,  8am-6pm EST. This drug testing is for medical treatment only.   Analysis was performed as non-forensic testing and  these results should be used only by healthcare  providers to render diagnosis or treatment, or to  monitor progress of medical conditions. Sharyn Lull comments are:  - present when  drug test results may be the result of     metabolism of one or more drugs or when results are     inconsistent with prescribed medication(s) listed.  - may be blank when drug results are consistent with     prescribed medication(s) listed. . For assistance with interpreting these drug results,  please contact a [employer] Toxicology  Specialist: [phone] TOX [phone]), M-F,  8am-6pm EST. This drug testing is for medical treatment only.   Analysis was performed as non-forensic testing and  these results should be used only by healthcare  providers to render diagnosis or treatment, or to  monitor progress of medical conditions. Sharyn Lull comments are:  - present when drug test results may be the result of     metabolism of one or more drugs or when results are     inconsistent with prescribed medication(s) listed.  - may be blank when drug results are consistent with     prescribed medication(s) listed. . For assistance with interpreting  these d rug results,  please contact a [employer], [employer] Toxicology  Specialist: [phone] TOX [phone]), M-F,  8am-6pm EST. This drug testing is for medical treatment only.   Analysis was performed as non-forensic testing and  these results should be used only by healthcare  providers to render diagnosis or treatment, or to  monitor progress of medical conditions. Sharyn Lull comments are:  - present when drug test results may be the result of     metabolism of one or more drugs or when results are     inconsistent with prescribed medication(s) listed.  - may be blank when drug results are consistent with     prescribed medication(s) listed. . For assistance with interpreting these drug results,  please contact a [employer] Toxicology  Specialist: [phone] TOX [phone]), M-F,  8am-6pm EST. This drug testing is for medical treatment only.   Analysis was performed as non-forensic testing and  these results should be used only by healthcare  provi ders to render diagnosis or treatment, or to  monitor progress of medical conditions. Sharyn Lull comments are:  - present when drug test results may be the result of     metabolism of one or more drugs or when results are     inconsistent with prescribed medication(s) listed.  - may be blank when drug results are consistent with     prescribed medication(s) listed. . For assistance with interpreting these drug results,  please contact a [employer] Toxicology  Specialist: [phone] TOX [phone]), M-F,  8am-6pm EST.  Assessment/Plan: 1. Visit for preventive health examination Depression screen negative. Health Maintenance reviewed -- Has PAP scheduled with GYN. Preventive schedule discussed and handout given in AVS. Will obtain fasting labs today.  - CBC with Differential/Platelet - Comprehensive metabolic panel - Lipid panel  2. Thyromegaly No nodules palpable on exam. Will check  Thyroid panel and Thyroid US. - TSH - US THYROID; Future - T4, free  3. Palpable lymph node Slightly palpable superficial node. Mobile. Does not seem pathologic. CBC today. Will monitor.    Piedad Climes, PA-C

## 2017-07-24 NOTE — Patient Instructions (Addendum)
Please go to the lab for blood work.   Our office will call you with your results unless you have chosen to receive results via MyChart.  If your blood work is normal we will follow-up each year for physicals and as scheduled for chronic medical problems.  If anything is abnormal we will treat accordingly and get you in for a follow-up.   Please keep your phone on. You will be contacted for assessment of the thyroid with an ultrasound.  Wear the ace wrap to add compression to the ankle. Take a daily Aleve when working to help with pain. Wear supportive footwear.  Let me know if you change your mind about the Sports medicine assessment.    Preventive Care 18-39 Years, Female Preventive care refers to lifestyle choices and visits with your health care provider that can promote health and wellness. What does preventive care include?  A yearly physical exam. This is also called an annual well check.  Dental exams once or twice a year.  Routine eye exams. Ask your health care provider how often you should have your eyes checked.  Personal lifestyle choices, including: ? Daily care of your teeth and gums. ? Regular physical activity. ? Eating a healthy diet. ? Avoiding tobacco and drug use. ? Limiting alcohol use. ? Practicing safe sex. ? Taking vitamin and mineral supplements as recommended by your health care provider. What happens during an annual well check? The services and screenings done by your health care provider during your annual well check will depend on your age, overall health, lifestyle risk factors, and family history of disease. Counseling Your health care provider may ask you questions about your:  Alcohol use.  Tobacco use.  Drug use.  Emotional well-being.  Home and relationship well-being.  Sexual activity.  Eating habits.  Work and work Statistician.  Method of birth control.  Menstrual cycle.  Pregnancy history.  Screening You may have the  following tests or measurements:  Height, weight, and BMI.  Diabetes screening. This is done by checking your blood sugar (glucose) after you have not eaten for a while (fasting).  Blood pressure.  Lipid and cholesterol levels. These may be checked every 5 years starting at age 72.  Skin check.  Hepatitis C blood test.  Hepatitis B blood test.  Sexually transmitted disease (STD) testing.  BRCA-related cancer screening. This may be done if you have a family history of breast, ovarian, tubal, or peritoneal cancers.  Pelvic exam and Pap test. This may be done every 3 years starting at age 33. Starting at age 68, this may be done every 5 years if you have a Pap test in combination with an HPV test.  Discuss your test results, treatment options, and if necessary, the need for more tests with your health care provider. Vaccines Your health care provider may recommend certain vaccines, such as:  Influenza vaccine. This is recommended every year.  Tetanus, diphtheria, and acellular pertussis (Tdap, Td) vaccine. You may need a Td booster every 10 years.  Varicella vaccine. You may need this if you have not been vaccinated.  HPV vaccine. If you are 47 or younger, you may need three doses over 6 months.  Measles, mumps, and rubella (MMR) vaccine. You may need at least one dose of MMR. You may also need a second dose.  Pneumococcal 13-valent conjugate (PCV13) vaccine. You may need this if you have certain conditions and were not previously vaccinated.  Pneumococcal polysaccharide (PPSV23) vaccine. You may  need one or two doses if you smoke cigarettes or if you have certain conditions.  Meningococcal vaccine. One dose is recommended if you are age 43-21 years and a first-year college student living in a residence hall, or if you have one of several medical conditions. You may also need additional booster doses.  Hepatitis A vaccine. You may need this if you have certain conditions or if  you travel or work in places where you may be exposed to hepatitis A.  Hepatitis B vaccine. You may need this if you have certain conditions or if you travel or work in places where you may be exposed to hepatitis B.  Haemophilus influenzae type b (Hib) vaccine. You may need this if you have certain risk factors.  Talk to your health care provider about which screenings and vaccines you need and how often you need them. This information is not intended to replace advice given to you by your health care provider. Make sure you discuss any questions you have with your health care provider. Document Released: 06/20/2001 Document Revised: 01/12/2016 Document Reviewed: 02/23/2015 Elsevier Interactive Patient Education  [employer]. .

## 2017-07-25 ENCOUNTER — Telehealth: Payer: Self-pay | Admitting: Physician Assistant

## 2017-07-25 ENCOUNTER — Other Ambulatory Visit (INDEPENDENT_AMBULATORY_CARE_PROVIDER_SITE_OTHER): Payer: 59

## 2017-07-25 ENCOUNTER — Other Ambulatory Visit: Payer: Self-pay | Admitting: Physician Assistant

## 2017-07-25 DIAGNOSIS — Z8632 Personal history of gestational diabetes: Secondary | ICD-10-CM

## 2017-07-25 LAB — HEMOGLOBIN A1C: Hgb A1c MFr Bld: 5.5 % (ref 4.6–6.5)

## 2017-07-25 NOTE — Telephone Encounter (Signed)
Late Entry: Results were reviewed with patient and she requested for A1c to be added on yesterday's test, if possible. This was recording and routed in the result note.  Patient stated she has not scheduled US as of yet but she will soon.

## 2017-07-27 MED ORDER — AMPHETAMINE-DEXTROAMPHETAMINE 5 MG PO TABS: 5.0000 mg | ORAL_TABLET | Freq: Every day | ORAL | 0 refills | Status: DC

## 2017-07-27 MED ORDER — AMPHETAMINE-DEXTROAMPHET ER 10 MG PO CP24: 10.0000 mg | ORAL_CAPSULE | Freq: Every day | ORAL | 0 refills | Status: DC

## 2017-07-27 MED ORDER — MELOXICAM 15 MG PO TABS: 15.0000 mg | ORAL_TABLET | Freq: Every day | ORAL | 0 refills | Status: DC

## 2017-07-27 NOTE — Telephone Encounter (Signed)
Copied from CRM [id number]. Topic: Quick Communication - See Telephone Encounter >> Jul 27, 2017  8:57 AM Lelon FrohlichGolden, Tashia, RMA wrote: CRM for notification. See Telephone encounter for: 07/27/17.pt called and stated that her adderral was not sent to her pharmacy nor was the anti inflammatory that was discussed at her recent visit pt [facility] [place] and pt # [id number]

## 2017-07-27 NOTE — Telephone Encounter (Signed)
Medications have been sent. Again with the Adderall they may require a prior authorization giving the dosing schedule.

## 2017-07-27 NOTE — Telephone Encounter (Signed)
Patient informed of Results and medication sent to pharmacy

## 2017-07-27 NOTE — Telephone Encounter (Signed)
Adderral refill request - controlled substance.   Not at the pharmacy  Also wondering about an anti-inflammatory that was discussed at her recent visit not being at the pharmacy.  Do not see this mentioned in the notes.  LOV 07/24/17 with Marcelline MatesWilliam Martin  [facility] [place].  [phone]

## 2017-08-28 ENCOUNTER — Encounter: Payer: Self-pay | Admitting: Physician Assistant

## 2017-08-28 ENCOUNTER — Ambulatory Visit: Payer: 59 | Admitting: Physician Assistant

## 2017-08-28 ENCOUNTER — Encounter: Payer: Self-pay | Admitting: Emergency Medicine

## 2017-08-28 ENCOUNTER — Other Ambulatory Visit: Payer: Self-pay

## 2017-08-28 VITALS — BP 118/68 | HR 109 | Temp 98.8°F | Resp 16 | Ht 67.0 in | Wt 165.0 lb

## 2017-08-28 DIAGNOSIS — F419 Anxiety disorder, unspecified: Secondary | ICD-10-CM | POA: Diagnosis not present
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
## 2017-09-03 ENCOUNTER — Encounter: Payer: Self-pay | Admitting: Physician Assistant

## 2017-09-03 ENCOUNTER — Ambulatory Visit: Payer: 59 | Admitting: Physician Assistant

## 2017-09-03 ENCOUNTER — Other Ambulatory Visit: Payer: Self-pay

## 2017-09-03 ENCOUNTER — Telehealth: Payer: Self-pay | Admitting: Physician Assistant

## 2017-09-03 DIAGNOSIS — F329 Major depressive disorder, single episode, unspecified: Secondary | ICD-10-CM | POA: Diagnosis not present

## 2017-09-03 DIAGNOSIS — F32A Anxiety disorder, unspecified: Secondary | ICD-10-CM

## 2017-09-03 DIAGNOSIS — F419 Anxiety disorder, unspecified: Secondary | ICD-10-CM | POA: Diagnosis not present
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
## 2017-09-03 NOTE — Telephone Encounter (Signed)
Received paperwork for "[place] Disability-Behavioral Health Questionnaire" from [place], via fax.  Asking for forms to be filled out and office notes from 4/21-present to be included in return fax. Placed in front bin with charge sheet.

## 2017-09-03 NOTE — Patient Instructions (Signed)
Please keep well-hydrated and try to get plenty of rest.  Make sure to look into the L-methylfolate supplement.  I am taking you out until your appointment with psychiatry. Ok to take the Xanax as directed if needed for severe acute anxiety.

## 2017-09-03 NOTE — Telephone Encounter (Signed)
Will complete and note when faxed back in.

## 2017-09-03 NOTE — Telephone Encounter (Signed)
Received paperwork for "Short Term Disability-Behavioral Health Questionnaire" from [place], via fax. Requesting the forms be completed and office notes from 4/21-Present be included in fax back. Placed in front bin with charge sheet.
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
## 2017-09-06 NOTE — Telephone Encounter (Signed)
Received second fax from [employer] in referance to paperwork received on 09/03/17. Second fax seems to be a follow-up/reminder. Includes original forms as well. Placed in front bin.

## 2017-09-13 ENCOUNTER — Telehealth: Payer: Self-pay | Admitting: Emergency Medicine

## 2017-09-13 ENCOUNTER — Encounter: Payer: Self-pay | Admitting: Emergency Medicine

## 2017-09-13 NOTE — Telephone Encounter (Signed)
It appears there are at least 2 phone notes referencing FMLA paperwork that indicate Abigail White is working on this.  She is also seeing psych today (according to last OV) and they can help determine what time is needed.  I will defer getting involved and have him address this when he returns.  We can provide a work note for time missed today (Thursday), tomorrow, and Monday

## 2017-09-13 NOTE — Telephone Encounter (Signed)
Spoke with patient, per her therapist, they do not do FMLA, the patient wants to discuss Selena Batten writing her out of work, she states that she is not sleeping or eating and she doesn't feel she can go back to work. I informed patient that cody was out of the office until Tuesday. Please advise.   Kathi Simpers,  LPN

## 2017-09-13 NOTE — Telephone Encounter (Signed)
Patient informed. I will place work note up front for pick up. Patient requested an appt for Tuesday 09/18/17 to discuss with [facility].    Kathi Simpers,  LPN

## 2017-09-18 ENCOUNTER — Telehealth: Payer: Self-pay | Admitting: Physician Assistant

## 2017-09-18 ENCOUNTER — Other Ambulatory Visit: Payer: Self-pay

## 2017-09-18 ENCOUNTER — Encounter: Payer: Self-pay | Admitting: Physician Assistant

## 2017-09-18 ENCOUNTER — Ambulatory Visit: Payer: 59 | Admitting: Physician Assistant

## 2017-09-18 VITALS — BP 98/68 | HR 85 | Temp 98.4°F | Resp 14 | Ht 67.0 in | Wt 162.0 lb

## 2017-09-18 DIAGNOSIS — F32A Anxiety disorder, unspecified: Secondary | ICD-10-CM

## 2017-09-18 DIAGNOSIS — N644 Mastodynia: Secondary | ICD-10-CM | POA: Diagnosis not present

## 2017-09-18 DIAGNOSIS — F419 Anxiety disorder, unspecified: Secondary | ICD-10-CM | POA: Diagnosis not present

## 2017-09-18 DIAGNOSIS — F329 Major depressive disorder, single episode, unspecified: Secondary | ICD-10-CM | POA: Diagnosis not present

## 2017-09-18 NOTE — Progress Notes (Signed)
Patient presents to clinic today to discuss anxiety and FMLA. Since last visit patient was able to get in with Psychiatry. Is seeing a DNP, Mrs. Manson Passey. Was started on [place] but is waiting on mail-order pharmacy to deliver medication. Is expected this week. In terms of anxiety, patient states counseling is helping but she is still having very rough days. Has restraining order in place against her significant other and this has been helping considerably. She has support of family currently which is helping her make sure children are taken of which is helping give her the time to see counselor. Denies SI/HI. Psychiatry is refusing to fill out FMLA as she states she was told "they no longer do FMLA".   Past Medical History:  Diagnosis Date  . ADHD (attention deficit hyperactivity disorder)   . Allergy   . Anemia   . Anxiety    H/O  . Carpal tunnel syndrome    bilateral  . Chest pain    H/O - no current problems - anxiety related  . Chronic fatigue   . Depression    Gestational  . Dyspnea   . GERD (gastroesophageal reflux disease)   . History of chickenpox   . HSV (herpes simplex virus) infection   . Hx of pyelonephritis   . Migraine   . Palpitations    H/O - no current problems-anxiety related  . PONV (postoperative nausea and vomiting)   . Postpartum care following vaginal delivery (2/13) 06/21/2016  . PTSD (post-traumatic stress disorder)   . Smoker   . Spontaneous vaginal delivery 06/21/2016   svd x 3  . Vaginal Pap smear, abnormal     Current Outpatient Medications on File Prior to Visit  Medication Sig Dispense Refill  . albuterol (PROAIR HFA) 108 (90 Base) MCG/ACT inhaler Inhale 2 puffs into the lungs every 6 (six) hours as needed. 8.5 Inhaler 0  . amphetamine-dextroamphetamine (ADDERALL XR) 10 MG 24 hr capsule Take 1 capsule (10 mg total) by mouth daily. 30 capsule 0  . amphetamine-dextroamphetamine (ADDERALL) 5 MG tablet Take 1 tablet (5 mg total) by mouth daily. 60  tablet 0  . butalbital-acetaminophen-caffeine (FIORICET, ESGIC) 50-325-40 MG tablet Take 1 tablet by mouth 2 (two) times daily as needed for headache. 5 tablet 0  . ibuprofen (ADVIL,MOTRIN) 800 MG tablet Take 1 tablet (800 mg total) by mouth every 8 (eight) hours as needed for cramping. 60 tablet 0  . Levonorgestrel (SKYLA) 13.5 MG IUD by Intrauterine route.    . meloxicam (MOBIC) 15 MG tablet Take 1 tablet (15 mg total) by mouth daily. 30 tablet 0  . ondansetron (ZOFRAN) 4 MG tablet Take 1 tablet (4 mg total) by mouth every 8 (eight) hours as needed for nausea or vomiting. 20 tablet 0  . valACYclovir (VALTREX) 500 MG tablet Take 1 tablet as needed for outbreak    . ALPRAZolam (XANAX) 0.25 MG tablet Take 1 tablet (0.25 mg total) by mouth 2 (two) times daily as needed for anxiety. (Patient not taking: Reported on 09/18/2017) 20 tablet 0  . L-Methylfolate 15 MG TABS Take 1 tablet by mouth daily.     No current facility-administered medications on file prior to visit.     Allergies  Allergen Reactions  . Effexor [Venlafaxine]     Multiple somatic issues  . Lexapro [Escitalopram Oxalate]     Mania   . Paxil [Paroxetine Hcl]     Feels detached   . Penicillins Hives    Has patient had  a PCN reaction causing immediate rash, facial/tongue/throat swelling, SOB or lightheadedness with hypotension: no Has patient had a PCN reaction causing severe rash involving mucus membranes or skin necrosis: yes Has patient had a PCN reaction that required hospitalization no Has patient had a PCN reaction occurring within the last 10 years: no If all of the above answers are "NO", then may proceed with Cephalosporin use.   . Sulfa Antibiotics   . Vicodin [Hydrocodone-Acetaminophen] Nausea And Vomiting    Projectile vomiting  . Wellbutrin [Bupropion]     Became belligerant  . Zoloft [Sertraline Hcl] Hives    rash  . Differin [Adapalene] Rash  . Latex Rash    Family History  Problem Relation Age of  Onset  . Coronary artery disease Mother 50  . Hypertension Mother   . Diabetes Mother   . Heart disease Mother   . Kidney Stones Mother   . Asthma Mother   . Kidney disease Mother   . Hyperthyroidism Mother   . COPD Mother   . Heart attack Mother   . Dementia Mother   . Other Father        Shot-violence  . Alcohol abuse Father   . Hypertension Brother   . Kidney Stones Brother   . Hyperlipidemia Brother   . COPD Brother   . Heart attack Brother   . Diabetes Brother   . Hypertension Brother   . Hypertension Maternal Grandmother   . Hyperlipidemia Maternal Grandmother   . Cancer Maternal Grandmother        Skin  . COPD Maternal Grandmother   . Diabetes Maternal Grandmother   . Cancer Maternal Grandfather        Bone  . Cancer Paternal Grandmother   . Stroke Paternal Grandmother   . Aneurysm Paternal Grandfather   . Hypertension Other   . Diabetes Other   . Asthma Son   . Hypertension Maternal Aunt   . COPD Maternal Aunt   . Diabetes Maternal Aunt   . Heart attack Maternal Aunt   . Cancer Paternal Aunt        Breast    Social History   Socioeconomic History  . Marital status: Married    Spouse name: Not on file  . Number of children: 3  . Years of education: 4  . Highest education level: Not on file  Occupational History  . Occupation: nusre     Associate Professor: [employer]  Social Needs  . Financial resource strain: Not on file  . Food insecurity:    Worry: Not on file    Inability: Not on file  . Transportation needs:    Medical: Not on file    Non-medical: Not on file  Tobacco Use  . Smoking status: Current Every Day Smoker    Packs/day: 0.50    Years: 20.00    Pack years: 10.00    Types: Cigarettes  . Smokeless tobacco: Never Used  Substance and Sexual Activity  . Alcohol use: No  . Drug use: No  . Sexual activity: Yes    Birth control/protection: None  Lifestyle  . Physical activity:    Days per week: Not on file    Minutes per session: Not on file    . Stress: Not on file  Relationships  . Social connections:    Talks on phone: Not on file    Gets together: Not on file    Attends religious service: Not on file    Active member of club  or organization: Not on file    Attends meetings of clubs or organizations: Not on file    Relationship status: Not on file  Other Topics Concern  . Not on file  Social History Narrative  . Not on file    Review of Systems - See HPI.  All other ROS are negative.  BP 98/68   Pulse 85   Temp 98.4 F (36.9 C) (Oral)   Resp 14   Ht  (1.702 m)   Wt 162 lb (73.5 kg)   SpO2 99%   Breastfeeding? No   BMI 25.37 kg/m   Physical Exam  Constitutional: She appears well-developed and well-nourished.  HENT:  Head: Normocephalic and atraumatic.  Cardiovascular: Normal rate.  Pulmonary/Chest: Effort normal.  Psychiatric: She has a normal mood and affect.  Vitals reviewed.   Recent Results (from the past 2160 hour(s))  Pain Mgmt, Profile 8 w/Conf, U     Status: None   Collection Time: 06/22/17  2:57 PM  Result Value Ref Range   Creatinine 110.1 > or = 20. mg/dL   pH 1.32 4.5 - 9.0   Oxidant NEGATIVE <200 mcg/mL   Amphetamines NEGATIVE <500 ng/mL   medMATCH Amphetamines CONSISTENT    Benzodiazepines NEGATIVE <100 ng/mL   medMATCH Benzodiazepines CONSISTENT    Marijuana Metabolite NEGATIVE <20 ng/mL   medMATCH Marijuana Metab CONSISTENT    Cocaine Metabolite NEGATIVE <150 ng/mL   medMATCH Cocaine Metab CONSISTENT    Opiates NEGATIVE <100 ng/mL   medMATCH Opiates CONSISTENT    Oxycodone NEGATIVE <100 ng/mL   medMATCH Oxycodone CONSISTENT    Buprenorphine, Urine NEGATIVE <5 ng/mL   medMATCH Buprenorphine CONSISTENT    MDMA NEGATIVE <500 ng/mL   [facility] MDMA CONSISTENT    Alcohol Metabolites NEGATIVE <500 ng/mL   medMATCH Alcohol Metab CONSISTENT    6 Acetylmorphine NEGATIVE <10 ng/mL   medMATCH 6 Acetylmorphine CONSISTENT     Comment: This drug testing is for medical treatment  only.   Analysis was performed as non-forensic testing and  these results should be used only by healthcare  providers to render diagnosis or treatment, or to  monitor progress of medical conditions. Sharyn Lull comments are:  - present when drug test results may be the result of     metabolism of one or more drugs or when results are     inconsistent with prescribed medication(s) listed.  - may be blank when drug results are consistent with     prescribed medication(s) listed. . For assistance with interpreting these drug results,  please contact a [employer] Toxicology  Specialist: [phone] TOX [phone]), M-F,  8am-6pm EST. This drug testing is for medical treatment only.   Analysis was performed as non-forensic testing and  these results should be used only by healthcare  providers to render diagnosis or treatment, or to  monitor progress of medical conditions. Sharyn Lull comments are:  - present when  drug test results may be the result of     metabolism of one or more drugs or when results are     inconsistent with prescribed medication(s) listed.  - may be blank when drug results are consistent with     prescribed medication(s) listed. . For assistance with interpreting these drug results,  please contact a [employer] Toxicology  Specialist: [phone] TOX [phone]), M-F,  8am-6pm EST. This drug testing is for medical treatment only.   Analysis was performed as non-forensic testing and  these results should be  used only by healthcare  providers to render diagnosis or treatment, or to  monitor progress of medical conditions. Sharyn Lull comments are:  - present when drug test results may be the result of     metabolism of one or more drugs or when results are     inconsistent with prescribed medication(s) listed.  - may be blank when drug results are consistent with     prescribed medication(s) listed. . For assistance with  interpreting these d rug results,  please contact a [employer], [employer] Toxicology  Specialist: [phone] TOX [phone]), M-F,  8am-6pm EST. This drug testing is for medical treatment only.   Analysis was performed as non-forensic testing and  these results should be used only by healthcare  providers to render diagnosis or treatment, or to  monitor progress of medical conditions. Sharyn Lull comments are:  - present when drug test results may be the result of     metabolism of one or more drugs or when results are     inconsistent with prescribed medication(s) listed.  - may be blank when drug results are consistent with     prescribed medication(s) listed. . For assistance with interpreting these drug results,  please contact a [employer] Toxicology  Specialist: [phone] TOX [phone]), M-F,  8am-6pm EST. This drug testing is for medical treatment only.   Analysis was performed as non-forensic testing and  these results should be used only by healthcare  provi ders to render diagnosis or treatment, or to  monitor progress of medical conditions. Sharyn Lull comments are:  - present when drug test results may be the result of     metabolism of one or more drugs or when results are     inconsistent with prescribed medication(s) listed.  - may be blank when drug results are consistent with     prescribed medication(s) listed. . For assistance with interpreting these drug results,  please contact a [employer] Toxicology  Specialist: [phone] TOX [phone]), M-F,  8am-6pm EST.   CBC with Differential/Platelet     Status: None   Collection Time: 07/24/17  9:28 AM  Result Value Ref Range   WBC 8.8 4.0 - 10.5 K/uL   RBC 4.40 3.87 - 5.11 Mil/uL   Hemoglobin 14.3 12.0 - 15.0 g/dL   HCT 32.9 51.8 - 84.1 %   MCV 95.1 78.0 - 100.0 fl   MCHC 34.1 30.0 - 36.0 g/dL   RDW 66.0 63.0 - 16.0 %   Platelets 297.0 150.0 - 400.0 K/uL   Neutrophils  Relative % 65.1 43.0 - 77.0 %   Lymphocytes Relative 25.7 12.0 - 46.0 %   Monocytes Relative 6.4 3.0 - 12.0 %   Eosinophils Relative 2.1 0.0 - 5.0 %   Basophils Relative 0.7 0.0 - 3.0 %   Neutro Abs 5.7 1.4 - 7.7 K/uL   Lymphs Abs 2.3 0.7 - 4.0 K/uL   Monocytes Absolute 0.6 0.1 - 1.0 K/uL   Eosinophils Absolute 0.2 0.0 - 0.7 K/uL   Basophils Absolute 0.1 0.0 - 0.1 K/uL  Comprehensive metabolic panel     Status: None   Collection Time: 07/24/17  9:28 AM  Result Value Ref Range   Sodium 138 135 - 145 mEq/L   Potassium 4.6 3.5 - 5.1 mEq/L   Chloride 106 96 - 112 mEq/L   CO2 26 19 - 32 mEq/L   Glucose, Bld 86 70 - 99 mg/dL   BUN 12 6 - 23 mg/dL  Creatinine, Ser 0.63 0.40 - 1.20 mg/dL   Total Bilirubin 0.5 0.2 - 1.2 mg/dL   Alkaline Phosphatase 51 39 - 117 U/L   AST 12 0 - 37 U/L   ALT 13 0 - 35 U/L   Total Protein 6.0 6.0 - 8.3 g/dL   Albumin 3.9 3.5 - 5.2 g/dL   Calcium 8.9 8.4 - 86.5 mg/dL   GFR 784.69 >62.95 mL/min  Lipid panel     Status: Abnormal   Collection Time: 07/24/17  9:28 AM  Result Value Ref Range   Cholesterol 172 0 - 200 mg/dL    Comment: ATP III Classification       Desirable:  < 200 mg/dL               Borderline High:  200 - 239 mg/dL          High:  > = 284 mg/dL   Triglycerides 13.2 0.0 - 149.0 mg/dL    Comment: Normal:  <440 mg/dLBorderline High:  150 - 199 mg/dL   HDL 10.27 >25.36 mg/dL   VLDL 64.4 0.0 - 03.4 mg/dL   LDL Cholesterol 742 (H) 0 - 99 mg/dL   Total CHOL/HDL Ratio 4     Comment:                Men          Women1/2 Average Risk     3.4          3.3Average Risk          5.0          4.42X Average Risk          9.6          7.13X Average Risk          15.0          11.0                       NonHDL 128.45     Comment: NOTE:  Non-HDL goal should be 30 mg/dL higher than patient's LDL goal (i.e. LDL goal of < 70 mg/dL, would have non-HDL goal of < 100 mg/dL)  TSH     Status: None   Collection Time: 07/24/17  9:28 AM  Result Value Ref Range   TSH  0.83 0.35 - 4.50 uIU/mL  T4, free     Status: None   Collection Time: 07/24/17  9:28 AM  Result Value Ref Range   Free T4 0.72 0.60 - 1.60 ng/dL    Comment: Specimens from patients who are undergoing biotin therapy and /or ingesting biotin supplements may contain high levels of biotin.  The higher biotin concentration in these specimens interferes with this Free T4 assay.  Specimens that contain high levels  of biotin may cause false high results for this Free T4 assay.  Please interpret results in light of the total clinical presentation of the patient.    Hemoglobin A1c     Status: None   Collection Time: 07/25/17  4:40 PM  Result Value Ref Range   Hgb A1c MFr Bld 5.5 4.6 - 6.5 %    Comment: Glycemic Control Guidelines for People with Diabetes:Non Diabetic:  <6%Goal of Therapy: <7%Additional Action Suggested:  >8%     Assessment/Plan: Anxiety and depression Followed by psychiatry. She is working on alleviating a lot of her triggering factors. Continue counseling 2-3 x week. Will plan for her to return to work  June 4th full time. Start medication as directed by specialist. Discussed that we will not be extending time off further than the date noted above. Will work on [employer].     Piedad Climes, PA-C

## 2017-09-18 NOTE — Telephone Encounter (Signed)
Patient dropped off a copy of Short Term Disability forms from [place]. Placed in front bin with charge sheet.

## 2017-09-18 NOTE — Assessment & Plan Note (Signed)
Followed by psychiatry. She is working on alleviating a lot of her triggering factors. Continue counseling 2-3 x week. Will plan for her to return to work June 4th full time. Start medication as directed by specialist. Discussed that we will not be extending time off further than the date noted above. Will work on [employer].

## 2017-09-18 NOTE — Telephone Encounter (Signed)
Forms in your folder 

## 2017-09-18 NOTE — Telephone Encounter (Signed)
error 

## 2017-09-19 NOTE — Telephone Encounter (Signed)
Forms received. In process. I am out of office tomorrow so these will be ready to fax first thing Friday morning 09/21/17.

## 2017-09-21 NOTE — Telephone Encounter (Signed)
Paperwork faxed °

## 2017-09-21 NOTE — Telephone Encounter (Signed)
Forms completed. Given to CMA to fax. 

## 2017-09-27 DIAGNOSIS — N644 Mastodynia: Secondary | ICD-10-CM | POA: Diagnosis not present

## 2017-09-27 DIAGNOSIS — N6002 Solitary cyst of left breast: Secondary | ICD-10-CM | POA: Diagnosis not present

## 2017-10-05 ENCOUNTER — Encounter: Payer: Self-pay | Admitting: Physician Assistant

## 2017-10-05 ENCOUNTER — Other Ambulatory Visit: Payer: Self-pay

## 2017-10-05 ENCOUNTER — Ambulatory Visit: Payer: 59 | Admitting: Physician Assistant

## 2017-10-05 VITALS — BP 94/60 | HR 96 | Temp 98.4°F | Resp 16 | Ht 67.0 in | Wt 161.8 lb

## 2017-10-05 DIAGNOSIS — F329 Major depressive disorder, single episode, unspecified: Secondary | ICD-10-CM

## 2017-10-05 DIAGNOSIS — F419 Anxiety disorder, unspecified: Secondary | ICD-10-CM

## 2017-10-05 DIAGNOSIS — F32A Anxiety disorder, unspecified: Secondary | ICD-10-CM

## 2017-10-05 MED ORDER — IBUPROFEN 800 MG PO TABS: 800.0000 mg | ORAL_TABLET | Freq: Three times a day (TID) | ORAL | 0 refills | Status: DC | PRN

## 2017-10-05 NOTE — Assessment & Plan Note (Signed)
Improving. Still going to counseling 2 x week and following with psychiatrist once monthly. Is scheduled to return back to work on the 6th as her first shift. Will monitor things. Glad she is doing better. Follow-up in 3 months.

## 2017-10-05 NOTE — Progress Notes (Signed)
Patient presents to clinic today for a follow-up of anxiety/depression prior to returning to work. Patient is seeing her psychiatrist once monthly for medication management and is seeing her counselor 2 x week. She has joined a Monday night Bible study and is working on a Christian co-dependent work book. Notes mother is a good support system. Is still staying away from her ex-significant other and is focusing on herself and the children. Is doing better and ready to return to work. 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  Past Medical History:  Diagnosis Date  . ADHD (attention deficit hyperactivity disorder)   . Allergy   . Anemia   . Anxiety    H/O  . Carpal tunnel syndrome    bilateral  . Chest pain    H/O - no current problems - anxiety related  . Chronic fatigue   . Depression    Gestational  . Dyspnea   . GERD (gastroesophageal reflux disease)   . History of chickenpox   . HSV (herpes simplex virus) infection   . Hx of pyelonephritis   . Migraine   . Palpitations    H/O - no current problems-anxiety related  . PONV (postoperative nausea and vomiting)   . Postpartum care following vaginal delivery (2/13) 06/21/2016  . PTSD (post-traumatic stress disorder)   . Smoker   . Spontaneous vaginal delivery 06/21/2016   svd x 3  . Vaginal Pap smear, abnormal     Current Outpatient Medications on File Prior to Visit  Medication Sig Dispense Refill  . albuterol (PROAIR HFA) 108 (90 Base) MCG/ACT inhaler Inhale 2 puffs into the lungs every 6 (six) hours as needed. 8.5 Inhaler 0  . amphetamine-dextroamphetamine (ADDERALL XR) 10 MG 24 hr capsule Take 1 capsule (10 mg total) by mouth daily. 30 capsule 0  . amphetamine-dextroamphetamine (ADDERALL) 5 MG tablet Take 1 tablet (5 mg total) by mouth daily. 60 tablet 0  . butalbital-acetaminophen-caffeine (FIORICET, ESGIC) 50-325-40 MG tablet Take 1 tablet by mouth 2 (two) times daily as needed for headache. 5  tablet 0  . L-Methylfolate 15 MG TABS Take 1 tablet by mouth daily.    . Levonorgestrel (SKYLA) 13.5 MG IUD by Intrauterine route.    . meloxicam (MOBIC) 15 MG tablet Take 1 tablet (15 mg total) by mouth daily. 30 tablet 0  . ondansetron (ZOFRAN) 4 MG tablet Take 1 tablet (4 mg total) by mouth every 8 (eight) hours as needed for nausea or vomiting. 20 tablet 0  . valACYclovir (VALTREX) 500 MG tablet Take 1 tablet as needed for outbreak    . ALPRAZolam (XANAX) 0.25 MG tablet Take 1 tablet (0.25 mg total) by mouth 2 (two) times daily as needed for anxiety. (Patient not taking: Reported on 09/18/2017) 20 tablet 0   No current facility-administered medications on file prior to visit.     Allergies  Allergen Reactions  . Effexor [Venlafaxine]     Multiple somatic issues  . Lexapro [Escitalopram Oxalate]     Mania   . Paxil [Paroxetine Hcl]     Feels detached   . Penicillins Hives    Has patient had a PCN reaction causing immediate rash, facial/tongue/throat swelling, SOB or lightheadedness with hypotension: no Has patient had a PCN reaction causing severe rash involving mucus membranes or skin necrosis: yes Has patient had a PCN reaction that required hospitalization no Has patient had a PCN reaction occurring within the last 10 years:  no If all of the above answers are "NO", then may proceed with Cephalosporin use.   . Sulfa Antibiotics   . Vicodin [Hydrocodone-Acetaminophen] Nausea And Vomiting    Projectile vomiting  . Wellbutrin [Bupropion]     Became belligerant  . Zoloft [Sertraline Hcl] Hives    rash  . Differin [Adapalene] Rash  . Latex Rash    Family History  Problem Relation Age of Onset  . Coronary artery disease Mother 71  . Hypertension Mother   . Diabetes Mother   . Heart disease Mother   . Kidney Stones Mother   . Asthma Mother   . Kidney disease Mother   . Hyperthyroidism Mother   . COPD Mother   . Heart attack Mother   . Dementia Mother   . Other Father         Shot-violence  . Alcohol abuse Father   . Hypertension Brother   . Kidney Stones Brother   . Hyperlipidemia Brother   . COPD Brother   . Heart attack Brother   . Diabetes Brother   . Hypertension Brother   . Hypertension Maternal Grandmother   . Hyperlipidemia Maternal Grandmother   . Cancer Maternal Grandmother        Skin  . COPD Maternal Grandmother   . Diabetes Maternal Grandmother   . Cancer Maternal Grandfather        Bone  . Cancer Paternal Grandmother   . Stroke Paternal Grandmother   . Aneurysm Paternal Grandfather   . Hypertension Other   . Diabetes Other   . Asthma Son   . Hypertension Maternal Aunt   . COPD Maternal Aunt   . Diabetes Maternal Aunt   . Heart attack Maternal Aunt   . Cancer Paternal Aunt        Breast    Social History   Socioeconomic History  . Marital status: Married    Spouse name: Not on file  . Number of children: 3  . Years of education: 4  . Highest education level: Not on file  Occupational History  . Occupation: nusre     Associate Professor: OTHER  Social Needs  . Financial resource strain: Not on file  . Food insecurity:    Worry: Not on file    Inability: Not on file  . Transportation needs:    Medical: Not on file    Non-medical: Not on file  Tobacco Use  . Smoking status: Current Every Day Smoker    Packs/day: 0.50    Years: 20.00    Pack years: 10.00    Types: Cigarettes  . Smokeless tobacco: Never Used  Substance and Sexual Activity  . Alcohol use: No  . Drug use: No  . Sexual activity: Yes    Birth control/protection: None  Lifestyle  . Physical activity:    Days per week: Not on file    Minutes per session: Not on file  . Stress: Not on file  Relationships  . Social connections:    Talks on phone: Not on file    Gets together: Not on file    Attends religious service: Not on file    Active member of club or organization: Not on file    Attends meetings of clubs or organizations: Not on file     Relationship status: Not on file  Other Topics Concern  . Not on file  Social History Narrative  . Not on file   Review of Systems - See HPI.  All  other ROS are negative.  BP 94/60   Pulse 96   Temp 98.4 F (36.9 C) (Oral)   Resp 16   Ht  (1.702 m)   Wt 161 lb 12.8 oz (73.4 kg)   SpO2 97%   BMI 25.34 kg/m   Physical Exam  Constitutional: She appears well-developed and well-nourished.  HENT:  Head: Normocephalic and atraumatic.  Eyes: Conjunctivae are normal.  Neck: Neck supple.  Cardiovascular: Normal rate, regular rhythm and normal heart sounds.  Pulmonary/Chest: Effort normal.  Lymphadenopathy:    She has no cervical adenopathy.  Psychiatric: She has a normal mood and affect.    Recent Results (from the past 2160 hour(s))  CBC with Differential/Platelet     Status: None   Collection Time: 07/24/17  9:28 AM  Result Value Ref Range   WBC 8.8 4.0 - 10.5 K/uL   RBC 4.40 3.87 - 5.11 Mil/uL   Hemoglobin 14.3 12.0 - 15.0 g/dL   HCT 16.1 09.6 - 04.5 %   MCV 95.1 78.0 - 100.0 fl   MCHC 34.1 30.0 - 36.0 g/dL   RDW 40.9 81.1 - 91.4 %   Platelets 297.0 150.0 - 400.0 K/uL   Neutrophils Relative % 65.1 43.0 - 77.0 %   Lymphocytes Relative 25.7 12.0 - 46.0 %   Monocytes Relative 6.4 3.0 - 12.0 %   Eosinophils Relative 2.1 0.0 - 5.0 %   Basophils Relative 0.7 0.0 - 3.0 %   Neutro Abs 5.7 1.4 - 7.7 K/uL   Lymphs Abs 2.3 0.7 - 4.0 K/uL   Monocytes Absolute 0.6 0.1 - 1.0 K/uL   Eosinophils Absolute 0.2 0.0 - 0.7 K/uL   Basophils Absolute 0.1 0.0 - 0.1 K/uL  Comprehensive metabolic panel     Status: None   Collection Time: 07/24/17  9:28 AM  Result Value Ref Range   Sodium 138 135 - 145 mEq/L   Potassium 4.6 3.5 - 5.1 mEq/L   Chloride 106 96 - 112 mEq/L   CO2 26 19 - 32 mEq/L   Glucose, Bld 86 70 - 99 mg/dL   BUN 12 6 - 23 mg/dL   Creatinine, Ser 7.82 0.40 - 1.20 mg/dL   Total Bilirubin 0.5 0.2 - 1.2 mg/dL   Alkaline Phosphatase 51 39 - 117 U/L   AST 12 0 - 37 U/L    ALT 13 0 - 35 U/L   Total Protein 6.0 6.0 - 8.3 g/dL   Albumin 3.9 3.5 - 5.2 g/dL   Calcium 8.9 8.4 - 95.6 mg/dL   GFR 213.08 >65.78 mL/min  Lipid panel     Status: Abnormal   Collection Time: 07/24/17  9:28 AM  Result Value Ref Range   Cholesterol 172 0 - 200 mg/dL    Comment: ATP III Classification       Desirable:  < 200 mg/dL               Borderline High:  200 - 239 mg/dL          High:  > = 469 mg/dL   Triglycerides 62.9 0.0 - 149.0 mg/dL    Comment: Normal:  <528 mg/dLBorderline High:  150 - 199 mg/dL   HDL 41.32 >44.01 mg/dL   VLDL 02.7 0.0 - 25.3 mg/dL   LDL Cholesterol 664 (H) 0 - 99 mg/dL   Total CHOL/HDL Ratio 4     Comment:                Men  Women1/2 Average Risk     3.4          3.3Average Risk          5.0          4.42X Average Risk          9.6          7.13X Average Risk          15.0          11.0                       NonHDL 128.45     Comment: NOTE:  Non-HDL goal should be 30 mg/dL higher than patient's LDL goal (i.e. LDL goal of < 70 mg/dL, would have non-HDL goal of < 100 mg/dL)  TSH     Status: None   Collection Time: 07/24/17  9:28 AM  Result Value Ref Range   TSH 0.83 0.35 - 4.50 uIU/mL  T4, free     Status: None   Collection Time: 07/24/17  9:28 AM  Result Value Ref Range   Free T4 0.72 0.60 - 1.60 ng/dL    Comment: Specimens from patients who are undergoing biotin therapy and /or ingesting biotin supplements may contain high levels of biotin.  The higher biotin concentration in these specimens interferes with this Free T4 assay.  Specimens that contain high levels  of biotin may cause false high results for this Free T4 assay.  Please interpret results in light of the total clinical presentation of the patient.    Hemoglobin A1c     Status: None   Collection Time: 07/25/17  4:40 PM  Result Value Ref Range   Hgb A1c MFr Bld 5.5 4.6 - 6.5 %    Comment: Glycemic Control Guidelines for People with Diabetes:Non Diabetic:  <6%Goal of Therapy:  <7%Additional Action Suggested:  >8%    Assessment/Plan: Anxiety and depression Improving. Still going to counseling 2 x week and following with psychiatrist once monthly. Is scheduled to return back to work on the 6th as her first shift. Will monitor things. Glad she is doing better. Follow-up in 3 months.     Piedad Climes, PA-C

## 2017-10-05 NOTE — Patient Instructions (Signed)
Please keep up with counseling appointments and appointments with your psychiatrist.  Remember your children's pediatrician will have to write FMLA for you to cover driving them to their counseling appointments.  Follow-up with me in 3 months. Return sooner if needed.

## 2017-10-17 ENCOUNTER — Telehealth: Payer: Self-pay | Admitting: General Practice

## 2017-10-17 NOTE — Telephone Encounter (Signed)
I will addend the FMLA to say that they need to work around her counseling appointments and would need 12 hrs for each appointment to cover shifts. This should allow work to schedule her on days that she does not have her appointment while still allowing her time from work if she has a flare-up.

## 2017-10-17 NOTE — Telephone Encounter (Signed)
Spoke with pt today, she advised that per her Company Leave office there needs to be some changes to her paperwork.   1. On the line where it talks about duration- pt states that it needs to say 12 hours  2. On the line where it talks about episodes it should say 1-3 times per week not per month. Pt states that the reasoning behind this is because she is using her FMLA to go to doctor appointments as well as calling out from work.   Pt states that she is trying to make these changes as her employer is trying to work with her for appts.

## 2017-10-17 NOTE — Telephone Encounter (Signed)
Paperwork faxed back as requested

## 2017-10-19 NOTE — Telephone Encounter (Signed)
Preadmission screen  

## 2017-10-25 ENCOUNTER — Ambulatory Visit: Payer: 59 | Admitting: Physician Assistant

## 2017-11-30 ENCOUNTER — Other Ambulatory Visit: Payer: Self-pay | Admitting: Physician Assistant

## 2017-12-08 DIAGNOSIS — R509 Fever, unspecified: Secondary | ICD-10-CM | POA: Diagnosis not present

## 2017-12-08 DIAGNOSIS — J209 Acute bronchitis, unspecified: Secondary | ICD-10-CM | POA: Diagnosis not present

## 2017-12-31 ENCOUNTER — Ambulatory Visit: Payer: 59 | Admitting: Physician Assistant

## 2017-12-31 DIAGNOSIS — Z0289 Encounter for other administrative examinations: Secondary | ICD-10-CM

## 2018-01-24 ENCOUNTER — Other Ambulatory Visit: Payer: Self-pay

## 2018-01-24 ENCOUNTER — Encounter: Payer: Self-pay | Admitting: Emergency Medicine

## 2018-01-24 ENCOUNTER — Ambulatory Visit: Payer: 59 | Admitting: Family Medicine

## 2018-01-24 ENCOUNTER — Encounter: Payer: Self-pay | Admitting: Family Medicine

## 2018-01-24 VITALS — BP 108/72 | HR 100 | Ht 67.0 in | Wt 168.6 lb

## 2018-01-24 DIAGNOSIS — J208 Acute bronchitis due to other specified organisms: Secondary | ICD-10-CM

## 2018-01-24 DIAGNOSIS — B9689 Other specified bacterial agents as the cause of diseases classified elsewhere: Secondary | ICD-10-CM

## 2018-01-24 MED ORDER — FLUTICASONE PROPIONATE HFA 110 MCG/ACT IN AERO: 2.0000 | INHALATION_SPRAY | Freq: Two times a day (BID) | RESPIRATORY_TRACT | 0 refills | Status: DC

## 2018-01-24 MED ORDER — FLUCONAZOLE 150 MG PO TABS: ORAL_TABLET | ORAL | 0 refills | Status: DC

## 2018-01-24 MED ORDER — PROMETHAZINE-DM 6.25-15 MG/5ML PO SYRP: 5.0000 mL | ORAL_SOLUTION | Freq: Four times a day (QID) | ORAL | 0 refills | Status: DC | PRN

## 2018-01-24 MED ORDER — AZITHROMYCIN 250 MG PO TABS: ORAL_TABLET | ORAL | 0 refills | Status: DC

## 2018-01-24 NOTE — Progress Notes (Signed)
Subjective  CC:  Chief Complaint  Patient presents with  . Cough    fever and coughing since saturday, pain with deep breaths    HPI: SUBJECTIVE:  Abigail White is a 37 y.o. female who complains of congestion, nasal blockage, post nasal drip, cough described as harsh and productive and denies sinus, high fevers, SOB, chest pain or significant GI symptoms.  Has had low-grade fever with a T-max of 100.8.  Both her children has had similar symptoms, however hers have persisted longer and she is a smoker.  Has history of reactive airway disease and has used a nebulizer today.  Symptoms have been present for 2 to 3 days. She denies a history of anorexia, dizziness, vomiting and wheezing. She denies a history of asthma or COPD. Patient does and does not smoke cigarettes.  Assessment  1. Acute bacterial bronchitis      Plan  Discussion:  Treat for bacterial bronchitis due to prolonged course and worsening symptoms.  Cannot tolerate oral prednisone so inhaled steroid prescribed.  Nonnarcotic cough syrup prescribed for symptomatic control.  Z-Pak and Diflucan if needed.  Education regarding differences between viral and bacterial infections and treatment options are discussed.  Supportive care measures are recommended.  We discussed the use of mucolytic's, decongestants, antihistamines and antitussives as needed.  Tylenol or Advil are recommended if needed.  Follow up: As needed  No orders of the defined types were placed in this encounter.  Meds ordered this encounter  Medications  . promethazine-dextromethorphan (PROMETHAZINE-DM) 6.25-15 MG/5ML syrup    Sig: Take 5 mLs by mouth 4 (four) times daily as needed for cough.    Dispense:  118 mL    Refill:  0  . azithromycin (ZITHROMAX) 250 MG tablet    Sig: Take 2 tabs today, then 1 tab daily for 4 days    Dispense:  1 each    Refill:  0  . fluconazole (DIFLUCAN) 150 MG tablet    Sig: Take one tablet today; may repeat in 3 days if symptoms  persist    Dispense:  2 tablet    Refill:  0  . fluticasone (FLOVENT HFA) 110 MCG/ACT inhaler    Sig: Inhale 2 puffs into the lungs 2 (two) times daily.    Dispense:  1 Inhaler    Refill:  0      I reviewed the patients updated PMH, FH, and SocHx.  Social History: Patient  reports that she has been smoking cigarettes. She has a 10.00 pack-year smoking history. She has never used smokeless tobacco. She reports that she does not drink alcohol or use drugs.  Patient Active Problem List   Diagnosis Date Noted  . Thyromegaly 07/24/2017  . Palpable lymph node 07/24/2017  . Visit for preventive health examination 08/07/2016  . Migraine without status migrainosus, not intractable 07/30/2016  . Attention deficit disorder (ADD) 07/26/2016  . Postpartum care following vaginal delivery (2/13) 06/21/2016  . Nicotine addiction 12/02/2012  . Anxiety and depression     Review of Systems: Cardiovascular: negative for chest pain Respiratory: negative for SOB or hemoptysis Gastrointestinal: negative for abdominal pain Genitourinary: negative for dysuria or gross hematuria Current Meds  Medication Sig  . albuterol (PROAIR HFA) 108 (90 Base) MCG/ACT inhaler Inhale 2 puffs into the lungs every 6 (six) hours as needed.  . ALPRAZolam (XANAX) 0.25 MG tablet Take 1 tablet (0.25 mg total) by mouth 2 (two) times daily as needed for anxiety.  Marland Kitchen amphetamine-dextroamphetamine (ADDERALL XR)  10 MG 24 hr capsule Take 1 capsule (10 mg total) by mouth daily.  Marland Kitchen. amphetamine-dextroamphetamine (ADDERALL) 5 MG tablet Take 1 tablet (5 mg total) by mouth daily.  . butalbital-acetaminophen-caffeine (FIORICET, ESGIC) 50-325-40 MG tablet Take 1 tablet by mouth 2 (two) times daily as needed for headache.  . ibuprofen (ADVIL,MOTRIN) 800 MG tablet Take 1 tablet (800 mg total) by mouth every 8 (eight) hours as needed for cramping.  Marland Kitchen. L-Methylfolate 15 MG TABS Take 1 tablet by mouth daily.  . Levonorgestrel (SKYLA) 13.5 MG IUD  by Intrauterine route.  . ondansetron (ZOFRAN) 4 MG tablet TAKE 1 TABLET BY MOUTH EVERY 8 HOURS AS NEEDED FOR NAUSEA AND VOMITING  . valACYclovir (VALTREX) 500 MG tablet Take 1 tablet as needed for outbreak    Objective  Vitals: BP 108/72   Pulse 100   Ht 5\' 7"  (1.702 m)   Wt 168 lb 9.6 oz (76.5 kg)   SpO2 98%   BMI 26.41 kg/m  General: no acute respiratory distress  Psych:  Alert and oriented, normal mood and affect HEENT:  Normocephalic, atraumatic, supple neck, moist mucous membranes, mildly erythematous pharynx without exudate, mild lymphadenopathy, supple neck Cardiovascular:  RRR without murmur. no edema Respiratory:  Good breath sounds bilaterally, CTAB with normal respiratory effort with rhonchi and rare expiratory wheeze. Skin:  Warm, no rashes Neurologic:   Mental status is normal. normal gait  Commons side effects, risks, benefits, and alternatives for medications and treatment plan prescribed today were discussed, and the patient expressed understanding of the given instructions. Patient is instructed to call or message via MyChart if he/she has any questions or concerns regarding our treatment plan. No barriers to understanding were identified. We discussed Red Flag symptoms and signs in detail. Patient expressed understanding regarding what to do in case of urgent or emergency type symptoms.  Medication list was reconciled, printed and provided to the patient in AVS. Patient instructions and summary information was reviewed with the patient as documented in the AVS. This note was prepared with assistance of Dragon voice recognition software. Occasional wrong-word or sound-a-like substitutions may have occurred due to the inherent limitations of voice recognition software

## 2018-01-24 NOTE — Patient Instructions (Signed)
Please follow up if symptoms do not improve or as needed.    Acute Bronchitis, Adult Acute bronchitis is sudden (acute) swelling of the air tubes (bronchi) in the lungs. Acute bronchitis causes these tubes to fill with mucus, which can make it hard to breathe. It can also cause coughing or wheezing. In adults, acute bronchitis usually goes away within 2 weeks. A cough caused by bronchitis may last up to 3 weeks. Smoking, allergies, and asthma can make the condition worse. Repeated episodes of bronchitis may cause further lung problems, such as chronic obstructive pulmonary disease (COPD). What are the causes? This condition can be caused by germs and by substances that irritate the lungs, including:  Cold and flu viruses. This condition is most often caused by the same virus that causes a cold.  Bacteria.  Exposure to tobacco smoke, dust, fumes, and air pollution.  What increases the risk? This condition is more likely to develop in people who:  Have close contact with someone with acute bronchitis.  Are exposed to lung irritants, such as tobacco smoke, dust, fumes, and vapors.  Have a weak immune system.  Have a respiratory condition such as asthma.  What are the signs or symptoms? Symptoms of this condition include:  A cough.  Coughing up clear, yellow, or green mucus.  Wheezing.  Chest congestion.  Shortness of breath.  A fever.  Body aches.  Chills.  A sore throat.  How is this diagnosed? This condition is usually diagnosed with a physical exam. During the exam, your health care provider may order tests, such as chest X-rays, to rule out other conditions. He or she may also:  Test a sample of your mucus for bacterial infection.  Check the level of oxygen in your blood. This is done to check for pneumonia.  Do a chest X-ray or lung function testing to rule out pneumonia and other conditions.  Perform blood tests.  Your health care provider will also ask  about your symptoms and medical history. How is this treated? Most cases of acute bronchitis clear up over time without treatment. Your health care provider may recommend:  Drinking more fluids. Drinking more makes your mucus thinner, which may make it easier to breathe.  Taking a medicine for a fever or cough.  Taking an antibiotic medicine.  Using an inhaler to help improve shortness of breath and to control a cough.  Using a cool mist vaporizer or humidifier to make it easier to breathe.  Follow these instructions at home: Medicines  Take over-the-counter and prescription medicines only as told by your health care provider.  If you were prescribed an antibiotic, take it as told by your health care provider. Do not stop taking the antibiotic even if you start to feel better. General instructions  Get plenty of rest.  Drink enough fluids to keep your urine clear or pale yellow.  Avoid smoking and secondhand smoke. Exposure to cigarette smoke or irritating chemicals will make bronchitis worse. If you smoke and you need help quitting, ask your health care provider. Quitting smoking will help your lungs heal faster.  Use an inhaler, cool mist vaporizer, or humidifier as told by your health care provider.  Keep all follow-up visits as told by your health care provider. This is important. How is this prevented? To lower your risk of getting this condition again:  Wash your hands often with soap and water. If soap and water are not available, use hand sanitizer.  Avoid contact with   people who have cold symptoms.  Try not to touch your hands to your mouth, nose, or eyes.  Make sure to get the flu shot every year.  Contact a health care provider if:  Your symptoms do not improve in 2 weeks of treatment. Get help right away if:  You cough up blood.  You have chest pain.  You have severe shortness of breath.  You become dehydrated.  You faint or keep feeling like you are  going to faint.  You keep vomiting.  You have a severe headache.  Your fever or chills gets worse. This information is not intended to replace advice given to you by your health care provider. Make sure you discuss any questions you have with your health care provider. Document Released: 06/01/2004 Document Revised: 11/17/2015 Document Reviewed: 10/13/2015 Elsevier Interactive Patient Education  2018 [employer]   

## 2018-02-16 ENCOUNTER — Other Ambulatory Visit: Payer: Self-pay | Admitting: Family Medicine

## 2018-05-17 ENCOUNTER — Ambulatory Visit: Payer: 59 | Admitting: Physician Assistant

## 2018-06-20 ENCOUNTER — Encounter: Payer: Self-pay | Admitting: Physician Assistant

## 2018-06-20 ENCOUNTER — Ambulatory Visit (INDEPENDENT_AMBULATORY_CARE_PROVIDER_SITE_OTHER): Payer: 59 | Admitting: Physician Assistant

## 2018-06-20 ENCOUNTER — Other Ambulatory Visit: Payer: Self-pay

## 2018-06-20 VITALS — BP 96/60 | HR 99 | Temp 98.7°F | Resp 16 | Ht 67.0 in | Wt 166.0 lb

## 2018-06-20 DIAGNOSIS — B9689 Other specified bacterial agents as the cause of diseases classified elsewhere: Secondary | ICD-10-CM

## 2018-06-20 DIAGNOSIS — J019 Acute sinusitis, unspecified: Secondary | ICD-10-CM | POA: Diagnosis not present

## 2018-06-20 MED ORDER — ONDANSETRON HCL 4 MG PO TABS: 4.0000 mg | ORAL_TABLET | Freq: Three times a day (TID) | ORAL | 0 refills | Status: DC | PRN

## 2018-06-20 MED ORDER — DOXYCYCLINE HYCLATE 100 MG PO CAPS: 100.0000 mg | ORAL_CAPSULE | Freq: Two times a day (BID) | ORAL | 0 refills | Status: DC

## 2018-06-20 MED ORDER — BENZONATATE 100 MG PO CAPS: 100.0000 mg | ORAL_CAPSULE | Freq: Three times a day (TID) | ORAL | 0 refills | Status: DC | PRN

## 2018-06-20 MED ORDER — IBUPROFEN 800 MG PO TABS: 800.0000 mg | ORAL_TABLET | Freq: Three times a day (TID) | ORAL | 0 refills | Status: DC | PRN

## 2018-06-20 NOTE — Progress Notes (Signed)
Patient presents to clinic today c/o 2 weeks of nasal congestion, sinus pressure, sinus pain, ear pain and fatigue. Notes 2 days of nausea 2/2 thick drainage and more sinus pain. Has been keeping hydrated. Has not taken anything OTC. Denies recent travel.    Past Medical History:  Diagnosis Date  . ADHD (attention deficit hyperactivity disorder)   . Allergy   . Anemia   . Anxiety    H/O  . Carpal tunnel syndrome    bilateral  . Chest pain    H/O - no current problems - anxiety related  . Chronic fatigue   . Depression    Gestational  . Dyspnea   . GERD (gastroesophageal reflux disease)   . History of chickenpox   . HSV (herpes simplex virus) infection   . Hx of pyelonephritis   . Migraine   . Palpitations    H/O - no current problems-anxiety related  . PONV (postoperative nausea and vomiting)   . Postpartum care following vaginal delivery (2/13) 06/21/2016  . PTSD (post-traumatic stress disorder)   . Smoker   . Spontaneous vaginal delivery 06/21/2016   svd x 3  . Vaginal Pap smear, abnormal     Current Outpatient Medications on File Prior to Visit  Medication Sig Dispense Refill  . albuterol (PROAIR HFA) 108 (90 Base) MCG/ACT inhaler Inhale 2 puffs into the lungs every 6 (six) hours as needed. 8.5 Inhaler 0  . FLOVENT HFA 110 MCG/ACT inhaler TAKE 2 PUFFS BY MOUTH TWICE A DAY 12 Inhaler 3  . ibuprofen (ADVIL,MOTRIN) 800 MG tablet Take 1 tablet (800 mg total) by mouth every 8 (eight) hours as needed for cramping. 90 tablet 0  . L-Methylfolate 15 MG TABS Take 1 tablet by mouth daily.    . Levonorgestrel (SKYLA) 13.5 MG IUD by Intrauterine route.    . valACYclovir (VALTREX) 500 MG tablet Take 1 tablet as needed for outbreak    . ondansetron (ZOFRAN) 4 MG tablet TAKE 1 TABLET BY MOUTH EVERY 8 HOURS AS NEEDED FOR NAUSEA AND VOMITING (Patient not taking: Reported on 06/20/2018) 9 tablet 2   No current facility-administered medications on file prior to visit.     Allergies    Allergen Reactions  . Effexor [Venlafaxine]     Multiple somatic issues  . Lexapro [Escitalopram Oxalate]     Mania   . Paxil [Paroxetine Hcl]     Feels detached   . Penicillins Hives    Has patient had a PCN reaction causing immediate rash, facial/tongue/throat swelling, SOB or lightheadedness with hypotension: no Has patient had a PCN reaction causing severe rash involving mucus membranes or skin necrosis: yes Has patient had a PCN reaction that required hospitalization no Has patient had a PCN reaction occurring within the last 10 years: no If all of the above answers are "NO", then may proceed with Cephalosporin use.   . Sulfa Antibiotics   . Vicodin [Hydrocodone-Acetaminophen] Nausea And Vomiting    Projectile vomiting  . Wellbutrin [Bupropion]     Became belligerant  . Zoloft [Sertraline Hcl] Hives    rash  . Differin [Adapalene] Rash  . Latex Rash    Family History  Problem Relation Age of Onset  . Coronary artery disease Mother [id number]  . Hypertension Mother   . Diabetes Mother   . Heart disease Mother   . Kidney Stones Mother   . Asthma Mother   . Kidney disease Mother   . Hyperthyroidism Mother   . COPD  Mother   . Heart attack Mother   . Dementia Mother   . Other Father        Shot-violence  . Alcohol abuse Father   . Hypertension Brother   . Kidney Stones Brother   . Hyperlipidemia Brother   . COPD Brother   . Heart attack Brother   . Diabetes Brother   . Hypertension Brother   . Hypertension Maternal Grandmother   . Hyperlipidemia Maternal Grandmother   . Cancer Maternal Grandmother        Skin  . COPD Maternal Grandmother   . Diabetes Maternal Grandmother   . Cancer Maternal Grandfather        Bone  . Cancer Paternal Grandmother   . Stroke Paternal Grandmother   . Aneurysm Paternal Grandfather   . Hypertension Other   . Diabetes Other   . Asthma Son   . Hypertension Maternal Aunt   . COPD Maternal Aunt   . Diabetes Maternal Aunt   . Heart  attack Maternal Aunt   . Cancer Paternal Aunt        Breast    Social History   Socioeconomic History  . Marital status: Married    Spouse name: Not on file  . Number of children: 3  . Years of education: 4  . Highest education level: Not on file  Occupational History  . Occupation: nusre     Associate Professor: OTHER  Social Needs  . Financial resource strain: Not on file  . Food insecurity:    Worry: Not on file    Inability: Not on file  . Transportation needs:    Medical: Not on file    Non-medical: Not on file  Tobacco Use  . Smoking status: Current Every Day Smoker    Packs/day: 0.50    Years: 20.00    Pack years: 10.00    Types: Cigarettes  . Smokeless tobacco: Never Used  Substance and Sexual Activity  . Alcohol use: No  . Drug use: No  . Sexual activity: Yes    Birth control/protection: None  Lifestyle  . Physical activity:    Days per week: Not on file    Minutes per session: Not on file  . Stress: Not on file  Relationships  . Social connections:    Talks on phone: Not on file    Gets together: Not on file    Attends religious service: Not on file    Active member of club or organization: Not on file    Attends meetings of clubs or organizations: Not on file    Relationship status: Not on file  Other Topics Concern  . Not on file  Social History Narrative  . Not on file   Review of Systems - See HPI.  All other ROS are negative.  BP 96/60   Pulse 99   Temp 98.7 F (37.1 C) (Oral)   Resp 16   Ht 5\' 7"  (1.702 m)   Wt 166 lb (75.3 kg)   SpO2 99%   Breastfeeding No   BMI 26.00 kg/m   Physical Exam Vitals signs reviewed.  Constitutional:      Appearance: Normal appearance.  HENT:     Head: Normocephalic and atraumatic.     Right Ear: Tympanic membrane normal.     Left Ear: Tympanic membrane normal.     Nose:     Right Turbinates: Enlarged and swollen.     Left Turbinates: Enlarged and swollen.  Right Sinus: Maxillary sinus tenderness and  frontal sinus tenderness present.     Left Sinus: Frontal sinus tenderness present. No maxillary sinus tenderness.     Mouth/Throat:     Mouth: Mucous membranes are moist.  Eyes:     Conjunctiva/sclera: Conjunctivae normal.  Neck:     Musculoskeletal: Neck supple.  Cardiovascular:     Rate and Rhythm: Normal rate and regular rhythm.     Heart sounds: Normal heart sounds.  Pulmonary:     Effort: Pulmonary effort is normal.     Breath sounds: Normal breath sounds.  Neurological:     General: No focal deficit present.     Mental Status: She is alert and oriented to person, place, and time.      Assessment/Plan: 1. Acute bacterial sinusitis Rx Doxycycline.  Increase fluids.  Rest.  Saline nasal spray.  Probiotic.  Mucinex as directed.  Humidifier in bedroom. Rx Tessalon and Zofran.  Call or return to clinic if symptoms are not improving.  - doxycycline (VIBRAMYCIN) 100 MG capsule; Take 1 capsule (100 mg total) by mouth 2 (two) times daily.  Dispense: 20 capsule; Refill: 0 - ondansetron (ZOFRAN) 4 MG tablet; Take 1 tablet (4 mg total) by mouth every 8 (eight) hours as needed for nausea or vomiting.  Dispense: 20 tablet; Refill: 0 - benzonatate (TESSALON) 100 MG capsule; Take 1 capsule (100 mg total) by mouth 3 (three) times daily as needed for cough.  Dispense: 30 capsule; Refill: 0   Piedad ClimesWilliam Cody Kilan Banfill, PA-C

## 2018-06-20 NOTE — Patient Instructions (Addendum)
Please take antibiotic as directed.  Increase fluid intake.  Use Saline nasal spray.  Take a daily multivitamin. Use the Zofran and Tessalon as directed for cough and nausea.  Start OTC Tylenol Sinus. Place a humidifier in the bedroom.  Please call or return clinic if symptoms are not improving.  Sinusitis Sinusitis is redness, soreness, and swelling (inflammation) of the paranasal sinuses. Paranasal sinuses are air pockets within the bones of your face (beneath the eyes, the middle of the forehead, or above the eyes). In healthy paranasal sinuses, mucus is able to drain out, and air is able to circulate through them by way of your nose. However, when your paranasal sinuses are inflamed, mucus and air can become trapped. This can allow bacteria and other germs to grow and cause infection. Sinusitis can develop quickly and last only a short time (acute) or continue over a long period (chronic). Sinusitis that lasts for more than 12 weeks is considered chronic.  CAUSES  Causes of sinusitis include:  Allergies.  Structural abnormalities, such as displacement of the cartilage that separates your nostrils (deviated septum), which can decrease the air flow through your nose and sinuses and affect sinus drainage.  Functional abnormalities, such as when the small hairs (cilia) that line your sinuses and help remove mucus do not work properly or are not present. SYMPTOMS  Symptoms of acute and chronic sinusitis are the same. The primary symptoms are pain and pressure around the affected sinuses. Other symptoms include:  Upper toothache.  Earache.  Headache.  Bad breath.  Decreased sense of smell and taste.  A cough, which worsens when you are lying flat.  Fatigue.  Fever.  Thick drainage from your nose, which often is green and may contain pus (purulent).  Swelling and warmth over the affected sinuses. DIAGNOSIS  Your caregiver will perform a physical exam. During the exam, your caregiver  may:  Look in your nose for signs of abnormal growths in your nostrils (nasal polyps).  Tap over the affected sinus to check for signs of infection.  View the inside of your sinuses (endoscopy) with a special imaging device with a light attached (endoscope), which is inserted into your sinuses. If your caregiver suspects that you have chronic sinusitis, one or more of the following tests may be recommended:  Allergy tests.  Nasal culture A sample of mucus is taken from your nose and sent to a lab and screened for bacteria.  Nasal cytology A sample of mucus is taken from your nose and examined by your caregiver to determine if your sinusitis is related to an allergy. TREATMENT  Most cases of acute sinusitis are related to a viral infection and will resolve on their own within 10 days. Sometimes medicines are prescribed to help relieve symptoms (pain medicine, decongestants, nasal steroid sprays, or saline sprays).  However, for sinusitis related to a bacterial infection, your caregiver will prescribe antibiotic medicines. These are medicines that will help kill the bacteria causing the infection.  Rarely, sinusitis is caused by a fungal infection. In theses cases, your caregiver will prescribe antifungal medicine. For some cases of chronic sinusitis, surgery is needed. Generally, these are cases in which sinusitis recurs more than 3 times per year, despite other treatments. HOME CARE INSTRUCTIONS   Drink plenty of water. Water helps thin the mucus so your sinuses can drain more easily.  Use a humidifier.  Inhale steam 3 to 4 times a day (for example, sit in the bathroom with the shower running).  Apply a warm, moist washcloth to your face 3 to 4 times a day, or as directed by your caregiver.  Use saline nasal sprays to help moisten and clean your sinuses.  Take over-the-counter or prescription medicines for pain, discomfort, or fever only as directed by your caregiver. SEEK IMMEDIATE  MEDICAL CARE IF:  You have increasing pain or severe headaches.  You have nausea, vomiting, or drowsiness.  You have swelling around your face.  You have vision problems.  You have a stiff neck.  You have difficulty breathing. MAKE SURE YOU:   Understand these instructions.  Will watch your condition.  Will get help right away if you are not doing well or get worse. Document Released: 04/24/2005 Document Revised: 07/17/2011 Document Reviewed: 05/09/2011 [facility] Patient Information 2014 [place], [place].

## 2018-06-23 ENCOUNTER — Emergency Department (HOSPITAL_BASED_OUTPATIENT_CLINIC_OR_DEPARTMENT_OTHER)
Admission: EM | Admit: 2018-06-23 | Discharge: 2018-06-23 | Disposition: A | Discharge status: Home / Self Care | Payer: 59 | Attending: Emergency Medicine | Admitting: Emergency Medicine

## 2018-06-23 ENCOUNTER — Other Ambulatory Visit: Payer: Self-pay

## 2018-06-23 ENCOUNTER — Encounter (HOSPITAL_BASED_OUTPATIENT_CLINIC_OR_DEPARTMENT_OTHER): Payer: Self-pay | Admitting: *Deleted

## 2018-06-23 ENCOUNTER — Emergency Department (HOSPITAL_BASED_OUTPATIENT_CLINIC_OR_DEPARTMENT_OTHER): Payer: 59

## 2018-06-23 DIAGNOSIS — Y929 Unspecified place or not applicable: Secondary | ICD-10-CM | POA: Insufficient documentation

## 2018-06-23 DIAGNOSIS — S99922A Unspecified injury of left foot, initial encounter: Secondary | ICD-10-CM | POA: Diagnosis present

## 2018-06-23 DIAGNOSIS — Z79899 Other long term (current) drug therapy: Secondary | ICD-10-CM | POA: Insufficient documentation

## 2018-06-23 DIAGNOSIS — W2203XA Walked into furniture, initial encounter: Secondary | ICD-10-CM | POA: Insufficient documentation

## 2018-06-23 DIAGNOSIS — Z91040 Latex allergy status: Secondary | ICD-10-CM | POA: Diagnosis not present

## 2018-06-23 DIAGNOSIS — S9032XA Contusion of left foot, initial encounter: Secondary | ICD-10-CM | POA: Diagnosis not present

## 2018-06-23 DIAGNOSIS — Y939 Activity, unspecified: Secondary | ICD-10-CM | POA: Insufficient documentation

## 2018-06-23 DIAGNOSIS — Y999 Unspecified external cause status: Secondary | ICD-10-CM | POA: Diagnosis not present

## 2018-06-23 DIAGNOSIS — M79672 Pain in left foot: Secondary | ICD-10-CM | POA: Diagnosis not present

## 2018-06-23 DIAGNOSIS — F17210 Nicotine dependence, cigarettes, uncomplicated: Secondary | ICD-10-CM | POA: Diagnosis not present

## 2018-06-23 NOTE — ED Triage Notes (Signed)
Pt states she kicked ?chair while jumping up to get her child. Reports pain in top of left foot. States pain is making her nauseated

## 2018-06-23 NOTE — ED Notes (Signed)
Pt verbalized understanding of dc instructions.

## 2018-06-23 NOTE — ED Provider Notes (Signed)
[facility] EMERGENCY DEPARTMENT Provider Note   CSN: [id number] Arrival date & time: 06/23/18  1625     History   Chief Complaint Chief Complaint  Patient presents with  . Foot Injury    HPI Abigail White is a 38 y.o. female.  HPI Patient is a 38 year old female presents the emergency department severe pain in the left foot after accidentally kicking a chair when she was running after her daughter.  She struck the top of her left foot.  Her pain is severe in severity.  No other injury.  Patient is taken a naproxen prior to arrival.  She does not want a more for pain at this time.   Past Medical History:  Diagnosis Date  . ADHD (attention deficit hyperactivity disorder)   . Allergy   . Anemia   . Anxiety    H/O  . Carpal tunnel syndrome    bilateral  . Chest pain    H/O - no current problems - anxiety related  . Chronic fatigue   . Depression    Gestational  . Dyspnea   . GERD (gastroesophageal reflux disease)   . History of chickenpox   . HSV (herpes simplex virus) infection   . Hx of pyelonephritis   . Migraine   . Palpitations    H/O - no current problems-anxiety related  . PONV (postoperative nausea and vomiting)   . Postpartum care following vaginal delivery (2/13) 06/21/2016  . PTSD (post-traumatic stress disorder)   . Smoker   . Spontaneous vaginal delivery 06/21/2016   svd x 3  . Vaginal Pap smear, abnormal     Patient Active Problem List   Diagnosis Date Noted  . Thyromegaly 07/24/2017  . Palpable lymph node 07/24/2017  . Visit for preventive health examination 08/07/2016  . Migraine without status migrainosus, not intractable 07/30/2016  . Attention deficit disorder (ADD) 07/26/2016  . Postpartum care following vaginal delivery (2/13) 06/21/2016  . Nicotine addiction 12/02/2012  . Anxiety and depression     Past Surgical History:  Procedure Laterality Date  . LEEP  2005, 2013   x 2  . NASAL SINUS SURGERY  1999  . WISDOM TOOTH  EXTRACTION       OB History    Gravida  4   Para  3   Term  3   Preterm      AB  1   Living  3     SAB      TAB  1   Ectopic      Multiple  0   Live Births  3            Home Medications    Prior to Admission medications   Medication Sig Start Date End Date Taking? Authorizing Provider  albuterol (PROAIR HFA) 108 (90 Base) MCG/ACT inhaler Inhale 2 puffs into the lungs every 6 (six) hours as needed. 06/22/17   Waldon MerlMartin, William C, PA-C  benzonatate (TESSALON) 100 MG capsule Take 1 capsule (100 mg total) by mouth 3 (three) times daily as needed for cough. 06/20/18   Waldon MerlMartin, William C, PA-C  doxycycline (VIBRAMYCIN) 100 MG capsule Take 1 capsule (100 mg total) by mouth 2 (two) times daily. 06/20/18   Waldon MerlMartin, William C, PA-C  FLOVENT HFA 110 MCG/ACT inhaler TAKE 2 PUFFS BY MOUTH TWICE A DAY 02/18/18   Waldon MerlMartin, William C, PA-C  ibuprofen (ADVIL,MOTRIN) 800 MG tablet Take 1 tablet (800 mg total) by mouth every 8 (eight) hours  as needed for cramping. 06/20/18   Waldon Merl, PA-C  L-Methylfolate 15 MG TABS Take 1 tablet by mouth daily.    [provider]  Levonorgestrel (SKYLA) 13.5 MG IUD by Intrauterine route.    [provider]  ondansetron (ZOFRAN) 4 MG tablet Take 1 tablet (4 mg total) by mouth every 8 (eight) hours as needed for nausea or vomiting. 06/20/18   Waldon Merl, PA-C  valACYclovir (VALTREX) 500 MG tablet Take 1 tablet as needed for outbreak 07/16/16   [provider]    Family History Family History  Problem Relation Age of Onset  . Coronary artery disease Mother 67  . Hypertension Mother   . Diabetes Mother   . Heart disease Mother   . Kidney Stones Mother   . Asthma Mother   . Kidney disease Mother   . Hyperthyroidism Mother   . COPD Mother   . Heart attack Mother   . Dementia Mother   . Other Father        Shot-violence  . Alcohol abuse Father   . Hypertension Brother   . Kidney Stones Brother   .  Hyperlipidemia Brother   . COPD Brother   . Heart attack Brother   . Diabetes Brother   . Hypertension Brother   . Hypertension Maternal Grandmother   . Hyperlipidemia Maternal Grandmother   . Cancer Maternal Grandmother        Skin  . COPD Maternal Grandmother   . Diabetes Maternal Grandmother   . Cancer Maternal Grandfather        Bone  . Cancer Paternal Grandmother   . Stroke Paternal Grandmother   . Aneurysm Paternal Grandfather   . Hypertension Other   . Diabetes Other   . Asthma Son   . Hypertension Maternal Aunt   . COPD Maternal Aunt   . Diabetes Maternal Aunt   . Heart attack Maternal Aunt   . Cancer Paternal Aunt        Breast    Social History Social History   Tobacco Use  . Smoking status: Current Every Day Smoker    Packs/day: 0.50    Years: 20.00    Pack years: 10.00    Types: Cigarettes  . Smokeless tobacco: Never Used  Substance Use Topics  . Alcohol use: No  . Drug use: No     Allergies   Effexor [venlafaxine]; Lexapro [escitalopram oxalate]; Paxil [paroxetine hcl]; Penicillins; Sulfa antibiotics; Vicodin [hydrocodone-acetaminophen]; Wellbutrin [bupropion]; Zoloft [sertraline hcl]; Differin [adapalene]; and Latex   Review of Systems Review of Systems  All other systems reviewed and are negative.    Physical Exam Updated Vital Signs BP 121/67   Pulse 89   Temp 98.1 F (36.7 C) (Oral)   Resp 20   SpO2 100%   Physical Exam Vitals signs and nursing note reviewed.  Constitutional:      Appearance: She is well-developed.  HENT:     Head: Normocephalic.  Neck:     Musculoskeletal: Normal range of motion.  Pulmonary:     Effort: Pulmonary effort is normal.  Abdominal:     General: There is no distension.  Musculoskeletal: Normal range of motion.     Comments: Tenderness over the dorsum of the left foot.  No obvious swelling.  Normal pulses left foot.  Compartments left foot are soft.  Neurological:     Mental Status: She is alert  and oriented to person, place, and time.      ED  Treatments / Results  Labs (all labs ordered are listed, but only abnormal results are displayed) Labs Reviewed - No data to display  EKG None  Radiology Dg Foot Complete Left  Result Date: 06/23/2018 CLINICAL DATA:  Pain after trauma. EXAM: LEFT FOOT - COMPLETE 3+ VIEW COMPARISON:  None. FINDINGS: There is no evidence of fracture or dislocation. There is no evidence of arthropathy or other focal bone abnormality. Soft tissues are unremarkable. IMPRESSION: Negative. Electronically Signed   By: Gerome Sam III M.D   On: 06/23/2018 17:09    Procedures Procedures (including critical care time)  Medications Ordered in ED Medications - No data to display   Initial Impression / Assessment and Plan / ED Course  I have reviewed the triage vital signs and the nursing notes.  Pertinent labs & imaging results that were available during my care of the patient were reviewed by me and considered in my medical decision making (see chart for details).     X-ray negative.  Contusion of the dorsum of the left foot.  Recommended anti-inflammatories, elevation, ice.  Informed of the possibility of occult fracture.  Weightbearing as tolerated  Final Clinical Impressions(s) / ED Diagnoses   Final diagnoses:  Acute pain of left foot    ED Discharge Orders    None       Azalia Bilis, MD 06/23/18 1729

## 2018-06-27 DIAGNOSIS — B349 Viral infection, unspecified: Secondary | ICD-10-CM | POA: Diagnosis not present

## 2018-07-31 ENCOUNTER — Telehealth: Payer: Self-pay | Admitting: *Deleted

## 2018-07-31 ENCOUNTER — Emergency Department (HOSPITAL_BASED_OUTPATIENT_CLINIC_OR_DEPARTMENT_OTHER)
Admission: EM | Admit: 2018-07-31 | Discharge: 2018-07-31 | Disposition: A | Discharge status: Home / Self Care | Payer: 59 | Attending: Emergency Medicine | Admitting: Emergency Medicine

## 2018-07-31 ENCOUNTER — Other Ambulatory Visit: Payer: Self-pay

## 2018-07-31 ENCOUNTER — Emergency Department (HOSPITAL_BASED_OUTPATIENT_CLINIC_OR_DEPARTMENT_OTHER): Payer: 59

## 2018-07-31 ENCOUNTER — Encounter (HOSPITAL_BASED_OUTPATIENT_CLINIC_OR_DEPARTMENT_OTHER): Payer: Self-pay | Admitting: *Deleted

## 2018-07-31 DIAGNOSIS — Z91040 Latex allergy status: Secondary | ICD-10-CM | POA: Diagnosis not present

## 2018-07-31 DIAGNOSIS — R1031 Right lower quadrant pain: Secondary | ICD-10-CM | POA: Diagnosis not present

## 2018-07-31 DIAGNOSIS — K5900 Constipation, unspecified: Secondary | ICD-10-CM

## 2018-07-31 DIAGNOSIS — Z79899 Other long term (current) drug therapy: Secondary | ICD-10-CM | POA: Diagnosis not present

## 2018-07-31 DIAGNOSIS — F17210 Nicotine dependence, cigarettes, uncomplicated: Secondary | ICD-10-CM | POA: Insufficient documentation

## 2018-07-31 HISTORY — DX: Unspecified ovarian cyst, unspecified side: N83.209

## 2018-07-31 LAB — URINALYSIS, ROUTINE W REFLEX MICROSCOPIC
Bilirubin Urine: NEGATIVE
Glucose, UA: NEGATIVE mg/dL
Ketones, ur: NEGATIVE mg/dL
Leukocytes,Ua: NEGATIVE
Nitrite: NEGATIVE
Protein, ur: NEGATIVE mg/dL
Specific Gravity, Urine: <1.005 — ABNORMAL LOW (ref 1.005–1.030)
pH: 6.5 (ref 5.0–8.0)

## 2018-07-31 LAB — COMPREHENSIVE METABOLIC PANEL
ALT: 16 U/L (ref 0–44)
AST: 16 U/L (ref 15–41)
Albumin: 4.1 g/dL (ref 3.5–5.0)
Alkaline Phosphatase: 57 U/L (ref 38–126)
Anion gap: 8 (ref 5–15)
BUN: 12 mg/dL (ref 6–20)
CO2: 21 mmol/L — ABNORMAL LOW (ref 22–32)
Calcium: 8.9 mg/dL (ref 8.9–10.3)
Chloride: 109 mmol/L (ref 98–111)
Creatinine, Ser: 0.58 mg/dL (ref 0.44–1.00)
GFR calc Af Amer: >60 mL/min (ref >60)
GFR calc non Af Amer: >60 mL/min (ref >60)
Glucose, Bld: 101 mg/dL — ABNORMAL HIGH (ref 70–99)
Potassium: 3.7 mmol/L (ref 3.5–5.1)
Sodium: 138 mmol/L (ref 135–145)
Total Bilirubin: 0.4 mg/dL (ref 0.3–1.2)
Total Protein: 7.2 g/dL (ref 6.5–8.1)

## 2018-07-31 LAB — CBC WITH DIFFERENTIAL/PLATELET
Abs Immature Granulocytes: 0.03 10*3/uL (ref 0.00–0.07)
Basophils Absolute: 0.1 10*3/uL (ref 0.0–0.1)
Basophils Relative: 1 %
Eosinophils Absolute: 0.2 10*3/uL (ref 0.0–0.5)
Eosinophils Relative: 2 %
HCT: 44.8 % (ref 36.0–46.0)
Hemoglobin: 14.4 g/dL (ref 12.0–15.0)
Immature Granulocytes: 0 %
Lymphocytes Relative: 24 %
Lymphs Abs: 2.3 10*3/uL (ref 0.7–4.0)
MCH: 30.4 pg (ref 26.0–34.0)
MCHC: 32.1 g/dL (ref 30.0–36.0)
MCV: 94.5 fL (ref 80.0–100.0)
Monocytes Absolute: 0.7 10*3/uL (ref 0.1–1.0)
Monocytes Relative: 8 %
Neutro Abs: 6.1 10*3/uL (ref 1.7–7.7)
Neutrophils Relative %: 65 %
Platelets: 300 10*3/uL (ref 150–400)
RBC: 4.74 MIL/uL (ref 3.87–5.11)
RDW: 12.3 % (ref 11.5–15.5)
WBC: 9.3 10*3/uL (ref 4.0–10.5)
nRBC: 0 % (ref 0.0–0.2)

## 2018-07-31 LAB — PREGNANCY, URINE: Preg Test, Ur: NEGATIVE

## 2018-07-31 LAB — URINALYSIS, MICROSCOPIC (REFLEX)

## 2018-07-31 LAB — LIPASE, BLOOD: Lipase: 21 U/L (ref 11–51)

## 2018-07-31 MED ORDER — SODIUM CHLORIDE 0.9 % IV BOLUS
1000.0000 mL | Freq: Once | INTRAVENOUS | Status: AC
  Administered 2018-07-31: 1000 mL via INTRAVENOUS

## 2018-07-31 MED ORDER — IOHEXOL 300 MG/ML  SOLN
100.0000 mL | Freq: Once | INTRAMUSCULAR | Status: AC | PRN
  Administered 2018-07-31: 100 mL via INTRAVENOUS

## 2018-07-31 NOTE — ED Provider Notes (Signed)
[facility] EMERGENCY DEPARTMENT Provider Note   CSN: [id number] Arrival date & time: 07/31/18  1347    History   Chief Complaint Chief Complaint  Patient presents with  . Abdominal Pain    HPI Abigail White is a 38 y.o. female.     HPI   Abigail White is a 38 y.o. female, with a history of IBS, presenting to the ED with abdominal pain beginning 2 days ago.  Pain is right lower quadrant, along the entire right abdomen, dull and rated 3/10 at rest, sharp and severe with movement or palpation.  Pain is overall worsening since onset.  Accompanied by fever, T-max 101 F, nausea, vomiting, and anorexia. Last bowel movement was this morning and was normal.  Last food was around 9 AM this morning.  Denies urinary symptoms, abnormal vaginal bleeding, vaginal discharge, hematochezia/melena, or any other complaints.   Past Medical History:  Diagnosis Date  . ADHD (attention deficit hyperactivity disorder)   . Allergy   . Anemia   . Anxiety    H/O  . Carpal tunnel syndrome    bilateral  . Chest pain    H/O - no current problems - anxiety related  . Chronic fatigue   . Depression    Gestational  . Dyspnea   . GERD (gastroesophageal reflux disease)   . History of chickenpox   . HSV (herpes simplex virus) infection   . Hx of pyelonephritis   . Migraine   . Ovarian cyst   . Palpitations    H/O - no current problems-anxiety related  . PONV (postoperative nausea and vomiting)   . Postpartum care following vaginal delivery (2/13) 06/21/2016  . PTSD (post-traumatic stress disorder)   . Smoker   . Spontaneous vaginal delivery 06/21/2016   svd x 3  . Vaginal Pap smear, abnormal     Patient Active Problem List   Diagnosis Date Noted  . Thyromegaly 07/24/2017  . Palpable lymph node 07/24/2017  . Visit for preventive health examination 08/07/2016  . Migraine without status migrainosus, not intractable 07/30/2016  . Attention deficit disorder (ADD) 07/26/2016    . Postpartum care following vaginal delivery (2/13) 06/21/2016  . Nicotine addiction 12/02/2012  . Anxiety and depression     Past Surgical History:  Procedure Laterality Date  . LEEP  2005, 2013   x 2  . NASAL SINUS SURGERY  1999  . WISDOM TOOTH EXTRACTION       OB History    Gravida  4   Para  3   Term  3   Preterm      AB  1   Living  3     SAB      TAB  1   Ectopic      Multiple  0   Live Births  3            Home Medications    Prior to Admission medications   Medication Sig Start Date End Date Taking? Authorizing Provider  albuterol (PROAIR HFA) 108 (90 Base) MCG/ACT inhaler Inhale 2 puffs into the lungs every 6 (six) hours as needed. 06/22/17   Waldon Merl, PA-C  benzonatate (TESSALON) 100 MG capsule Take 1 capsule (100 mg total) by mouth 3 (three) times daily as needed for cough. 06/20/18   Waldon Merl, PA-C  doxycycline (VIBRAMYCIN) 100 MG capsule Take 1 capsule (100 mg total) by mouth 2 (two) times daily. 06/20/18   Waldon Merl,  PA-C  FLOVENT HFA 110 MCG/ACT inhaler TAKE 2 PUFFS BY MOUTH TWICE A DAY 02/18/18   Waldon Merl, PA-C  ibuprofen (ADVIL,MOTRIN) 800 MG tablet Take 1 tablet (800 mg total) by mouth every 8 (eight) hours as needed for cramping. 06/20/18   Waldon Merl, PA-C  L-Methylfolate 15 MG TABS Take 1 tablet by mouth daily.    [provider]  Levonorgestrel (SKYLA) 13.5 MG IUD by Intrauterine route.    [provider]  ondansetron (ZOFRAN) 4 MG tablet Take 1 tablet (4 mg total) by mouth every 8 (eight) hours as needed for nausea or vomiting. 06/20/18   Waldon Merl, PA-C  valACYclovir (VALTREX) 500 MG tablet Take 1 tablet as needed for outbreak 07/16/16   [provider]    Family History Family History  Problem Relation Age of Onset  . Coronary artery disease Mother 25  . Hypertension Mother   . Diabetes Mother   . Heart disease Mother   . Kidney Stones Mother   . Asthma  Mother   . Kidney disease Mother   . Hyperthyroidism Mother   . COPD Mother   . Heart attack Mother   . Dementia Mother   . Other Father        Shot-violence  . Alcohol abuse Father   . Hypertension Brother   . Kidney Stones Brother   . Hyperlipidemia Brother   . COPD Brother   . Heart attack Brother   . Diabetes Brother   . Hypertension Brother   . Hypertension Maternal Grandmother   . Hyperlipidemia Maternal Grandmother   . Cancer Maternal Grandmother        Skin  . COPD Maternal Grandmother   . Diabetes Maternal Grandmother   . Cancer Maternal Grandfather        Bone  . Cancer Paternal Grandmother   . Stroke Paternal Grandmother   . Aneurysm Paternal Grandfather   . Hypertension Other   . Diabetes Other   . Asthma Son   . Hypertension Maternal Aunt   . COPD Maternal Aunt   . Diabetes Maternal Aunt   . Heart attack Maternal Aunt   . Cancer Paternal Aunt        Breast    Social History Social History   Tobacco Use  . Smoking status: Current Every Day Smoker    Packs/day: 0.50    Years: 20.00    Pack years: 10.00    Types: Cigarettes  . Smokeless tobacco: Never Used  Substance Use Topics  . Alcohol use: No  . Drug use: No     Allergies   Effexor [venlafaxine]; Lexapro [escitalopram oxalate]; Paxil [paroxetine hcl]; Penicillins; Sulfa antibiotics; Vicodin [hydrocodone-acetaminophen]; Wellbutrin [bupropion]; Zoloft [sertraline hcl]; Differin [adapalene]; and Latex   Review of Systems Review of Systems  Constitutional: Positive for appetite change and fever.  Respiratory: Negative for cough and shortness of breath.   Cardiovascular: Negative for chest pain.  Gastrointestinal: Positive for abdominal pain, nausea and vomiting. Negative for blood in stool, constipation and diarrhea.  Genitourinary: Negative for dysuria, frequency, hematuria, vaginal bleeding and vaginal discharge.  Musculoskeletal: Negative for back pain.  Neurological: Negative for  weakness.  All other systems reviewed and are negative.    Physical Exam Updated Vital Signs BP (!) 150/120 (BP Location: Left Arm)   Pulse 99   Temp 98.1 F (36.7 C) (Oral)   Resp 18   Ht 5\' 7"  (1.702 m)   Wt 73 kg   SpO2  100%   BMI 25.22 kg/m   Physical Exam Vitals signs and nursing note reviewed.  Constitutional:      General: She is not in acute distress.    Appearance: She is well-developed. She is not diaphoretic.  HENT:     Head: Normocephalic and atraumatic.     Mouth/Throat:     Mouth: Mucous membranes are moist.     Pharynx: Oropharynx is clear.  Eyes:     Conjunctiva/sclera: Conjunctivae normal.  Neck:     Musculoskeletal: Neck supple.  Cardiovascular:     Rate and Rhythm: Normal rate and regular rhythm.     Pulses: Normal pulses.     Heart sounds: Normal heart sounds.     Comments: Tactile temperature in the extremities appropriate and equal bilaterally. Pulmonary:     Effort: Pulmonary effort is normal. No respiratory distress.     Breath sounds: Normal breath sounds.  Abdominal:     Palpations: Abdomen is soft.     Tenderness: There is abdominal tenderness in the right lower quadrant. There is no right CVA tenderness, left CVA tenderness or guarding.     Comments: Palpation of the periumbilical, suprapubic, and right upper quadrant cause pain into the right lower quadrant.  Musculoskeletal:     Right lower leg: No edema.     Left lower leg: No edema.  Lymphadenopathy:     Cervical: No cervical adenopathy.  Skin:    General: Skin is warm and dry.  Neurological:     Mental Status: She is alert.  Psychiatric:        Mood and Affect: Mood and affect normal.        Speech: Speech normal.        Behavior: Behavior normal.      ED Treatments / Results  Labs (all labs ordered are listed, but only abnormal results are displayed) Labs Reviewed  URINALYSIS, ROUTINE W REFLEX MICROSCOPIC - Abnormal; Notable for the following components:      Result  Value   Color, Urine STRAW (*)    Specific Gravity, Urine <1.005 (*)    Hgb urine dipstick TRACE (*)    All other components within normal limits  COMPREHENSIVE METABOLIC PANEL - Abnormal; Notable for the following components:   CO2 21 (*)    Glucose, Bld 101 (*)    All other components within normal limits  URINALYSIS, MICROSCOPIC (REFLEX) - Abnormal; Notable for the following components:   Bacteria, UA RARE (*)    All other components within normal limits  PREGNANCY, URINE  CBC WITH DIFFERENTIAL/PLATELET  LIPASE, BLOOD    EKG None  Radiology US Transvaginal Non-ob  Result Date: 07/31/2018 CLINICAL DATA:  Right lower quadrant pain for the past 2 days. EXAM: TRANSABDOMINAL AND TRANSVAGINAL ULTRASOUND OF PELVIS DOPPLER ULTRASOUND OF OVARIES TECHNIQUE: Both transabdominal and transvaginal ultrasound examinations of the pelvis were performed. Transabdominal technique was performed for global imaging of the pelvis including uterus, ovaries, adnexal regions, and pelvic cul-de-sac. It was necessary to proceed with endovaginal exam following the transabdominal exam to visualize the uterus, endometrium, and adnexa. Color and duplex Doppler ultrasound was utilized to evaluate blood flow to the ovaries. COMPARISON:  CT abdomen pelvis from same day. FINDINGS: Uterus Measurements: 8.1 x 4.3 x 5.2 cm = volume: 96 mL. No fibroids or other mass visualized. Endometrium Thickness: 6 mm.  No focal abnormality visualized. Right ovary Measurements: 2.0 x 1.7 x 1.7 cm = volume: 3 mL. Normal appearance/no adnexal mass. Left ovary  Measurements: 2.9 x 2.7 x 2.1 cm = volume: 9 mL. Normal appearance/no adnexal mass. Corpus luteum. Pulsed Doppler evaluation of both ovaries demonstrates normal low-resistance arterial and venous waveforms. Other findings No abnormal free fluid. IMPRESSION: 1. Normal pelvic ultrasound. Electronically Signed   By: Obie Dredge M.D.   On: 07/31/2018 18:13   US Pelvis Complete  Result  Date: 07/31/2018 CLINICAL DATA:  Right lower quadrant pain for the past 2 days. EXAM: TRANSABDOMINAL AND TRANSVAGINAL ULTRASOUND OF PELVIS DOPPLER ULTRASOUND OF OVARIES TECHNIQUE: Both transabdominal and transvaginal ultrasound examinations of the pelvis were performed. Transabdominal technique was performed for global imaging of the pelvis including uterus, ovaries, adnexal regions, and pelvic cul-de-sac. It was necessary to proceed with endovaginal exam following the transabdominal exam to visualize the uterus, endometrium, and adnexa. Color and duplex Doppler ultrasound was utilized to evaluate blood flow to the ovaries. COMPARISON:  CT abdomen pelvis from same day. FINDINGS: Uterus Measurements: 8.1 x 4.3 x 5.2 cm = volume: 96 mL. No fibroids or other mass visualized. Endometrium Thickness: 6 mm.  No focal abnormality visualized. Right ovary Measurements: 2.0 x 1.7 x 1.7 cm = volume: 3 mL. Normal appearance/no adnexal mass. Left ovary Measurements: 2.9 x 2.7 x 2.1 cm = volume: 9 mL. Normal appearance/no adnexal mass. Corpus luteum. Pulsed Doppler evaluation of both ovaries demonstrates normal low-resistance arterial and venous waveforms. Other findings No abnormal free fluid. IMPRESSION: 1. Normal pelvic ultrasound. Electronically Signed   By: Obie Dredge M.D.   On: 07/31/2018 18:13   Ct Abdomen Pelvis W Contrast  Result Date: 07/31/2018 CLINICAL DATA:  Right lower quadrant pain and nausea x2 days. EXAM: CT ABDOMEN AND PELVIS WITH CONTRAST TECHNIQUE: Multidetector CT imaging of the abdomen and pelvis was performed using the standard protocol following bolus administration of intravenous contrast. CONTRAST:  OMNIPAQUE IOHEXOL 300 MG/ML  SOLN COMPARISON:  None. FINDINGS: Lower chest: No acute abnormality. Hepatobiliary: Faint vascular blush in the right hepatic lobe, series 2/20 measuring approximately 19 x 12 mm is nonspecific and more likely represents transient perfusion differences within the  liver. This is not apparent on prior study. No biliary dilatation. Gallbladder is nondistended and free of stones. Pancreas: Normal Spleen: Normal Adrenals/Urinary Tract: Normal bilateral adrenal glands. Symmetric cortical enhancement of both kidneys. No nephrolithiasis nor obstructive uropathy. Apparent hydroureteronephrosis. The urinary bladder is physiologically distended. No focal mural thickening or calculus. Stomach/Bowel: Normal appendix. Nondistended stomach with normal small bowel rotation. No small bowel obstruction or inflammation. Moderate stool retention is seen within the colon more so on the right. Vascular/Lymphatic: Nonaneurysmal aorta.  No lymphadenopathy. Reproductive: IUD within the endometrium. The uterus is unremarkable otherwise. No adnexal mass. Other: Tiny periumbilical fat containing hernia. No abdominopelvic ascites. Musculoskeletal: No acute or significant osseous findings. IMPRESSION: No acute bowel inflammation or obstruction. Normal appendix. Moderate stool retention within the ascending colon. Electronically Signed   By: Tollie Eth M.D.   On: 07/31/2018 16:23   [place] Art/ven Flow Abd Pelv Doppler  Result Date: 07/31/2018 CLINICAL DATA:  Right lower quadrant pain for the past 2 days. EXAM: TRANSABDOMINAL AND TRANSVAGINAL ULTRASOUND OF PELVIS DOPPLER ULTRASOUND OF OVARIES TECHNIQUE: Both transabdominal and transvaginal ultrasound examinations of the pelvis were performed. Transabdominal technique was performed for global imaging of the pelvis including uterus, ovaries, adnexal regions, and pelvic cul-de-sac. It was necessary to proceed with endovaginal exam following the transabdominal exam to visualize the uterus, endometrium, and adnexa. Color and duplex Doppler ultrasound was utilized to evaluate blood flow  to the ovaries. COMPARISON:  CT abdomen pelvis from same day. FINDINGS: Uterus Measurements: 8.1 x 4.3 x 5.2 cm = volume: 96 mL. No fibroids or other mass visualized.  Endometrium Thickness: 6 mm.  No focal abnormality visualized. Right ovary Measurements: 2.0 x 1.7 x 1.7 cm = volume: 3 mL. Normal appearance/no adnexal mass. Left ovary Measurements: 2.9 x 2.7 x 2.1 cm = volume: 9 mL. Normal appearance/no adnexal mass. Corpus luteum. Pulsed Doppler evaluation of both ovaries demonstrates normal low-resistance arterial and venous waveforms. Other findings No abnormal free fluid. IMPRESSION: 1. Normal pelvic ultrasound. Electronically Signed   By: Obie Dredge M.D.   On: 07/31/2018 18:13    Procedures Procedures (including critical care time)  Medications Ordered in ED Medications  sodium chloride 0.9 % bolus 1,000 mL (0 mLs Intravenous Stopped 07/31/18 1619)  iohexol (OMNIPAQUE) 300 MG/ML solution 100 mL (100 mLs Intravenous Contrast Given 07/31/18 1541)     Initial Impression / Assessment and Plan / ED Course  I have reviewed the triage vital signs and the nursing notes.  Pertinent labs & imaging results that were available during my care of the patient were reviewed by me and considered in my medical decision making (see chart for details).  Clinical Course as of Jul 31 1919  Wed Jul 31, 2018  1420 Declines analgesia at this time.   [SJ]  1638 Discussed lab and CT results with the patient.  She appears to be more comfortable, lying calmly on the bed.  We discussed next steps for evaluation, including pelvic exam and ultrasound.  Patient declined pelvic exam and swabs, but states she will allow ultrasound.     [SJ]    Clinical Course User Index [SJ] Nusaiba Guallpa C, PA-C       Patient presents with abdominal pain. Patient is nontoxic appearing, afebrile, not tachycardic, not tachypneic, not hypotensive, maintains excellent SPO2 on room air, and is in no apparent distress.  No leukocytosis.  Moderate stool burden in the ascending colon, but CT otherwise unremarkable.  Pelvic ultrasound reassuring.  She will follow-up with her PCP. The patient was given  instructions for home care as well as return precautions. Patient voices understanding of these instructions, accepts the plan, and is comfortable with discharge.     Vitals:   07/31/18 1353 07/31/18 1354 07/31/18 1442 07/31/18 1619  BP: (!) 150/120  109/65 108/61  Pulse: 99  84 88  Resp: 18   18  Temp: 98.1 F (36.7 C)     TempSrc: Oral     SpO2: 100%  100% 100%  Weight:  73 kg    Height:  5\' 7"  (1.702 m)       Final Clinical Impressions(s) / ED Diagnoses   Final diagnoses:  Right lower quadrant abdominal pain  Constipation, unspecified constipation type    ED Discharge Orders    None       Concepcion Living 07/31/18 1926    Alvira Monday, MD 08/03/18 [phone]

## 2018-07-31 NOTE — ED Notes (Signed)
Pt verbalizes understanding of d/c instructions and denies any further needs at this time. 

## 2018-07-31 NOTE — Discharge Instructions (Signed)
Constipation  You have evidence of constipation. Please follow the instructions below:  Hydration: You should start drinking at least ten, 8 oz glasses of water a day to help relieve your constipation. Baseline hydration should be at least eight, 8 oz glasses of water a day. This is a daily amount that you should be drinking, even without your current issue. Proper hydration not only helps prevent constipation, it is essential for any of the following treatments to be effective.  Fiber: Begin taking the fiber supplement daily. You should also increase the fiber in your diet.  Follow-up: Follow-up with your primary care provider or gastroenterologist as soon as possible for continued management of this issue.  Return: Return to the ED should any symptoms worsen.  For prescription assistance, may try using prescription discount sites or apps, such as goodrx.com

## 2018-07-31 NOTE — ED Triage Notes (Signed)
Pt sent here by PMD for r/o appendicitis and right lower abd pain

## 2018-07-31 NOTE — Telephone Encounter (Signed)
Patient just called, she states that for 2 days she has been having right side abdomin pain, and feeling like she has to use the bathroom with no luck, and her temp is 99.5 and rising. She denies any other symptoms and is wanting to know what can be done, explained webex but states she needs blood work to confirm if she is having appendicitis. Please advise [phone]

## 2018-07-31 NOTE — Telephone Encounter (Signed)
Pt is aware to go to the ER per [place].

## 2018-09-03 ENCOUNTER — Other Ambulatory Visit: Payer: Self-pay | Admitting: Physician Assistant

## 2018-09-03 DIAGNOSIS — B9689 Other specified bacterial agents as the cause of diseases classified elsewhere: Secondary | ICD-10-CM

## 2018-09-03 DIAGNOSIS — J019 Acute sinusitis, unspecified: Principal | ICD-10-CM

## 2018-11-22 ENCOUNTER — Other Ambulatory Visit: Payer: Self-pay | Admitting: Physician Assistant

## 2018-11-22 NOTE — Telephone Encounter (Signed)
Last refill:06/20/18 #90, 0 Last OV:06/20/18, acute bacterial sinusitis

## 2018-12-13 ENCOUNTER — Encounter: Payer: Self-pay | Admitting: Physician Assistant

## 2018-12-13 ENCOUNTER — Other Ambulatory Visit: Payer: Self-pay

## 2018-12-13 ENCOUNTER — Ambulatory Visit (INDEPENDENT_AMBULATORY_CARE_PROVIDER_SITE_OTHER): Payer: 59 | Admitting: Physician Assistant

## 2018-12-13 ENCOUNTER — Ambulatory Visit: Payer: Self-pay | Admitting: *Deleted

## 2018-12-13 VITALS — BP 98/64 | HR 98 | Temp 98.7°F | Resp 16 | Ht 67.0 in | Wt 154.0 lb

## 2018-12-13 DIAGNOSIS — F419 Anxiety disorder, unspecified: Secondary | ICD-10-CM

## 2018-12-13 DIAGNOSIS — F329 Major depressive disorder, single episode, unspecified: Secondary | ICD-10-CM

## 2018-12-13 DIAGNOSIS — Z8632 Personal history of gestational diabetes: Secondary | ICD-10-CM

## 2018-12-13 DIAGNOSIS — M2550 Pain in unspecified joint: Secondary | ICD-10-CM

## 2018-12-13 DIAGNOSIS — G8929 Other chronic pain: Secondary | ICD-10-CM | POA: Diagnosis not present

## 2018-12-13 DIAGNOSIS — F32A Anxiety disorder, unspecified: Secondary | ICD-10-CM

## 2018-12-13 LAB — COMPREHENSIVE METABOLIC PANEL
ALT: 8 U/L (ref 0–35)
AST: 10 U/L (ref 0–37)
Albumin: 4.5 g/dL (ref 3.5–5.2)
Alkaline Phosphatase: 60 U/L (ref 39–117)
BUN: 8 mg/dL (ref 6–23)
CO2: 26 mEq/L (ref 19–32)
Calcium: 9.5 mg/dL (ref 8.4–10.5)
Chloride: 104 mEq/L (ref 96–112)
Creatinine, Ser: 0.71 mg/dL (ref 0.40–1.20)
GFR: 92.23 mL/min (ref >60.00)
Glucose, Bld: 86 mg/dL (ref 70–99)
Potassium: 3.6 mEq/L (ref 3.5–5.1)
Sodium: 139 mEq/L (ref 135–145)
Total Bilirubin: 0.5 mg/dL (ref 0.2–1.2)
Total Protein: 7 g/dL (ref 6.0–8.3)

## 2018-12-13 LAB — URINALYSIS, ROUTINE W REFLEX MICROSCOPIC
Bilirubin Urine: NEGATIVE
Ketones, ur: NEGATIVE
Leukocytes,Ua: NEGATIVE
Nitrite: NEGATIVE
Specific Gravity, Urine: 1.01 (ref 1.000–1.030)
Total Protein, Urine: NEGATIVE
Urine Glucose: NEGATIVE
Urobilinogen, UA: 0.2 (ref 0.0–1.0)
WBC, UA: NONE SEEN (ref 0–?)
pH: 6.5 (ref 5.0–8.0)

## 2018-12-13 LAB — SEDIMENTATION RATE: Sed Rate: 15 mm/hr (ref 0–20)

## 2018-12-13 LAB — CBC WITH DIFFERENTIAL/PLATELET
Basophils Absolute: 0.1 10*3/uL (ref 0.0–0.1)
Basophils Relative: 0.7 % (ref 0.0–3.0)
Eosinophils Absolute: 0.1 10*3/uL (ref 0.0–0.7)
Eosinophils Relative: 1.6 % (ref 0.0–5.0)
HCT: 43.4 % (ref 36.0–46.0)
Hemoglobin: 14.5 g/dL (ref 12.0–15.0)
Lymphocytes Relative: 21.4 % (ref 12.0–46.0)
Lymphs Abs: 1.7 10*3/uL (ref 0.7–4.0)
MCHC: 33.5 g/dL (ref 30.0–36.0)
MCV: 95 fl (ref 78.0–100.0)
Monocytes Absolute: 0.5 10*3/uL (ref 0.1–1.0)
Monocytes Relative: 6.9 % (ref 3.0–12.0)
Neutro Abs: 5.4 10*3/uL (ref 1.4–7.7)
Neutrophils Relative %: 69.4 % (ref 43.0–77.0)
Platelets: 298 10*3/uL (ref 150.0–400.0)
RBC: 4.57 Mil/uL (ref 3.87–5.11)
RDW: 12.9 % (ref 11.5–15.5)
WBC: 7.7 10*3/uL (ref 4.0–10.5)

## 2018-12-13 LAB — TSH: TSH: 0.46 u[IU]/mL (ref 0.35–4.50)

## 2018-12-13 LAB — HEMOGLOBIN A1C: Hgb A1c MFr Bld: 5.4 % (ref 4.6–6.5)

## 2018-12-13 NOTE — Telephone Encounter (Signed)
Pt called stating that she feels depressed, and she has been feeling this way for about a month; she denies self harm or harming others; the pt says that she cries, feels blue, and can not eat; she has lost 13 pounds in weeks; the pt says that she still sees her counselor, and she sees him weekly since Saturday; she sees Abigail White, [facility]; she says that she is unable to take medication for this because of the side effects; the pt also says that she has been having stress headaches; recommendations made per nurse triage protocol; she refuses going to the ED and would like to see her PCP; the pt sees Abigail White, [place] [facility]; she can be contacted at [phone]; will route to office for final disposition.  Reason for Disposition . [1] Depression AND [2] unable to do any of normal activities (e.g., self care, school, work; in comparison to baseline).  Answer Assessment - Initial Assessment Questions 1. CONCERN: "What happened that made you call today?"     "I can't take it anymore" 2. DEPRESSION SYMPTOM SCREENING: "How are you feeling overall?" (e.g., decreased energy, increased sleeping or difficulty sleeping, difficulty concentrating, feelings of sadness, guilt, hopelessness, or worthlessness)     Increased crying 3. RISK OF HARM - SUICIDAL IDEATION:  "Do you ever have thoughts of hurting or killing yourself?"  (e.g., yes, no, no but preoccupation with thoughts about death)   - INTENT:  "Do you have thoughts of hurting or killing yourself right NOW?" (e.g., yes, no, N/A)   - PLAN: "Do you have a specific plan for how you would do this?" (e.g., gun, knife, overdose, no plan, N/A)    no 4. RISK OF HARM - HOMICIDAL IDEATION:  "Do you ever have thoughts of hurting or killing someone else?"  (e.g., yes, no, no but preoccupation with thoughts about death)   - INTENT:  "Do you have thoughts of hurting or killing someone right NOW?" (e.g., yes, no, N/A)   - PLAN: "Do you have a specific  plan for how you would do this?" (e.g., gun, knife, no plan, N/A)      no 5. FUNCTIONAL IMPAIRMENT: "How have things been going for you overall in your life? Have you had any more difficulties than usual doing your normal daily activities?"  (e.g., better, same, worse; self-care, school, work, interactions)     worse 6. SUPPORT: "Who is with you now?" "Who do you live with?" "Do you have family or friends nearby who you can talk to?"    Children and husband 7. THERAPIST: "Do you have a counselor or therapist? Name?"     Abigail White 8. STRESSORS: "Has there been any new stress or recent changes in your life?"  no 9. DRUG ABUSE/ALCOHOL: "Do you drink alcohol or use any illegal drugs?"     no 10. OTHER: "Do you have any other health or medical symptoms right now?" (e.g., fever)       Trouble sleeping and focusing, can not eat lost 13 pounds in 2 weeks 11. PREGNANCY: "Is there any chance you are pregnant?" "When was your last menstrual period?"       No 12/11/2018  Protocols used: DEPRESSION-A-AH

## 2018-12-13 NOTE — Progress Notes (Signed)
Patient with history of anxiety and depression (previously followed by Psychiatry) presents to clinic today c/o recurrence and acute worsening of depressive and anxiety symptoms over the past few months. Notes she is having a very hard time. Notes depressed mood with severe anhedonia. States takes all of her energy to get out of bed. She does for her children and makes sure they are taking care of. Significant home stressors as her husband in a recovering drug user. Just finished rehabilitation, etc and back home. Has been doing very well but this still causes a lot of stress for her. Notes she is crying all of the time. Significant decrease in appetite. Some nausea without vomiting. Always feels in a fight or flight response.  COVID has worsened her Anxiety. Unable to go to church for months.  Anxiety about kids being in Daycare with this. Endorses having no alone time to decompress. Has a prayer partner that is her support system. Patient is seeing a counselor once weekly. Notes he has recommended increase frequency due to exacerbation of symptoms. She is currently unable to do this due to work schedule and kids school schedule, etc. Has not seen psychiatrist in 1.5 years. Previously unable to tolerate SSRI, SNRI ot TCA due to bad side effects. Wellbutrin caused SI/HI. Has taken BZDs before for acute anxiety.  Patient also requesting labs for joint stiffness and aching, lasting an hour or so each morning. Denies redness, swelling or bruising. Mainly in hands. Notes widespread muscular aching intermittently. Questions possibility of fibromyalgia or rheumatological process.   Leeanne Rio 12/16/2018, 4:12 PM years.  Past Medical History:  Diagnosis Date  . ADHD (attention deficit hyperactivity disorder)   . Allergy   . Anemia   . Anxiety    H/O  . Carpal tunnel syndrome    bilateral  . Chest pain    H/O - no current problems - anxiety related  . Chronic fatigue   . Depression    Gestational  . Dyspnea   . GERD (gastroesophageal reflux disease)   . History of chickenpox   . HSV (herpes simplex virus) infection   . Hx of pyelonephritis   . Migraine   . Ovarian cyst   . Palpitations    H/O - no current problems-anxiety related  . PONV (postoperative nausea and vomiting)   . Postpartum care following vaginal delivery (2/13) 06/21/2016  . PTSD (post-traumatic stress disorder)   . Smoker   . Spontaneous vaginal delivery 06/21/2016   svd x 3  . Vaginal Pap smear, abnormal    Current Outpatient Medications on File Prior to Visit  Medication Sig Dispense Refill  . albuterol (PROAIR HFA) 108 (90 Base) MCG/ACT inhaler Inhale 2 puffs into the lungs every 6 (six) hours as needed. 8.5 Inhaler 0  . FLOVENT HFA 110 MCG/ACT inhaler TAKE 2 PUFFS BY MOUTH TWICE A DAY 12 Inhaler 3  . ibuprofen (ADVIL,MOTRIN) 800 MG tablet Take 1 tablet (800 mg total) by mouth every 8 (eight) hours as needed for cramping. 90 tablet 0  . Levonorgestrel (SKYLA) 13.5 MG IUD by Intrauterine route.    . ondansetron (ZOFRAN) 4 MG tablet Take 1 tablet (4 mg total) by mouth every 8 (eight) hours as needed for nausea or vomiting. 20 tablet 0  . valACYclovir (VALTREX) 500 MG tablet Take 1 tablet as needed for outbreak    . L-Methylfolate 15 MG TABS Take 1 tablet by mouth daily.     No current facility-administered medications on file  prior to visit.     Allergies  Allergen Reactions  . Effexor [Venlafaxine]     Multiple somatic issues  . Lexapro [Escitalopram Oxalate]     Mania   . Paxil [Paroxetine Hcl]     Feels detached   . Penicillins Hives    Has patient had a PCN reaction causing immediate rash, facial/tongue/throat swelling, SOB or lightheadedness with hypotension: no Has patient had a PCN reaction causing severe rash involving mucus membranes or skin necrosis: yes Has patient had a PCN reaction that required hospitalization no Has patient had a PCN reaction occurring within the last 10  years: no If all of the above answers are "NO", then may proceed with Cephalosporin use.   . Sulfa Antibiotics   . Vicodin [Hydrocodone-Acetaminophen] Nausea And Vomiting    Projectile vomiting  . Wellbutrin [Bupropion]     Became belligerant  . Zoloft [Sertraline Hcl] Hives    rash  . Differin [Adapalene] Rash  . Latex Rash    Family History  Problem Relation Age of Onset  . Coronary artery disease Mother 2855  . Hypertension Mother   . Diabetes Mother   . Heart disease Mother   . Kidney Stones Mother   . Asthma Mother   . Kidney disease Mother   . Hyperthyroidism Mother   . COPD Mother   . Heart attack Mother   . Dementia Mother   . Other Father        Shot-violence  . Alcohol abuse Father   . Hypertension Brother   . Kidney Stones Brother   . Hyperlipidemia Brother   . COPD Brother   . Heart attack Brother   . Diabetes Brother   . Hypertension Brother   . Hypertension Maternal Grandmother   . Hyperlipidemia Maternal Grandmother   . Cancer Maternal Grandmother        Skin  . COPD Maternal Grandmother   . Diabetes Maternal Grandmother   . Cancer Maternal Grandfather        Bone  . Cancer Paternal Grandmother   . Stroke Paternal Grandmother   . Aneurysm Paternal Grandfather   . Hypertension Other   . Diabetes Other   . Asthma Son   . Hypertension Maternal Aunt   . COPD Maternal Aunt   . Diabetes Maternal Aunt   . Heart attack Maternal Aunt   . Cancer Paternal Aunt        Breast    Social History   Socioeconomic History  . Marital status: Married    Spouse name: Not on file  . Number of children: 3  . Years of education: 4  . Highest education level: Not on file  Occupational History  . Occupation: nusre     Associate Professormployer: OTHER  Social Needs  . Financial resource strain: Not on file  . Food insecurity    Worry: Not on file    Inability: Not on file  . Transportation needs    Medical: Not on file    Non-medical: Not on file  Tobacco Use  .  Smoking status: Current Every Day Smoker    Packs/day: 0.50    Years: 20.00    Pack years: 10.00    Types: Cigarettes  . Smokeless tobacco: Never Used  Substance and Sexual Activity  . Alcohol use: No  . Drug use: No  . Sexual activity: Yes    Birth control/protection: I.U.D.  Lifestyle  . Physical activity    Days per week: Not on file  Minutes per session: Not on file  . Stress: Not on file  Relationships  . Social Musicianconnections    Talks on phone: Not on file    Gets together: Not on file    Attends religious service: Not on file    Active member of club or organization: Not on file    Attends meetings of clubs or organizations: Not on file    Relationship status: Not on file  Other Topics Concern  . Not on file  Social History Narrative  . Not on file   Review of Systems - See HPI.  All other ROS are negative.  BP 98/64   Pulse 98   Temp 98.7 F (37.1 C) (Skin)   Resp 16   Ht 5\' 7"  (1.702 m)   Wt 154 lb (69.9 kg)   SpO2 99%   BMI 24.12 kg/m   Physical Exam Vitals signs reviewed.  Constitutional:      Appearance: Normal appearance. She is not ill-appearing or toxic-appearing.  HENT:     Head: Normocephalic and atraumatic.  Cardiovascular:     Rate and Rhythm: Normal rate and regular rhythm.  Pulmonary:     Effort: Pulmonary effort is normal.  Neurological:     General: No focal deficit present.     Mental Status: She is alert and oriented to person, place, and time.  Psychiatric:        Attention and Perception: Attention and perception normal. She is attentive. She does not perceive auditory or visual hallucinations.        Mood and Affect: Mood is anxious and depressed. Affect is tearful.        Speech: Speech normal.        Behavior: Behavior normal.        Thought Content: Thought content normal.        Cognition and Memory: Cognition normal.        Judgment: Judgment normal.    Assessment/Plan: 1. Anxiety and depression Referral to psychiatry  placed as she does not do well with medication but further intervention is needed. Thankfully no alarm symptoms (SI/HI, etc). Will have her increase frequency of counseling. Will write her out of work for 2 weeks to decompress and work on these appointments. Further time out will have to come from specialist. Check TSH today to see if this is in any way contributing.  - TSH  2. Chronic pain of multiple joints Examination today unremarkable overall. Suspect related to level of anxiety and depression. Will check panel today for further assessment.  - Sedimentation rate - Antinuclear Antib (ANA) - Rheumatoid Factor - TSH  3. History of gestational diabetes Repeat CMP and A1C today. Will check a urine as well. - CBC with Differential/Platelet - Comprehensive metabolic panel - Hemoglobin A1c - Urinalysis, Routine w reflex microscopic   Piedad ClimesWilliam Cody Zaivion Kundrat, PA-C

## 2018-12-13 NOTE — Telephone Encounter (Signed)
Please advise for in office or VV?

## 2018-12-13 NOTE — Telephone Encounter (Signed)
Video visit is fine

## 2018-12-13 NOTE — Patient Instructions (Signed)
Please go to the lab today for blood work.  I will call you with your results. We will alter treatment regimen(s) if indicated by your results.   You will be contacted for assessment with Psychiatry. Contact your counselor to increase the frequency of your visits for the next few weeks. I am taking you out of work for now for the next 2 weeks so you can take time to work on these things to calm down anxiety and help mood.   ER for any worsening of symptoms.

## 2018-12-13 NOTE — Telephone Encounter (Signed)
Called patient to get appt scheduled for VV. Adamant that she wanted to be seen in the office. Patient hyperventilating and crying through out the entire call. Stated that she is having trouble with all daily activities. Stressed with life in general. Stated that over the past month, things have become worse and experiencing multiple panic attacks. Denies wanting to harm herself or others. Patient has been scheduled in office for 8.7.20 @ 11

## 2018-12-15 LAB — RHEUMATOID FACTOR: Rheumatoid fact SerPl-aCnc: <14 IU/mL (ref <14)

## 2018-12-15 LAB — ANA: Anti Nuclear Antibody (ANA): NEGATIVE

## 2018-12-17 ENCOUNTER — Other Ambulatory Visit: Payer: Self-pay | Admitting: Emergency Medicine

## 2018-12-17 DIAGNOSIS — F32A Anxiety disorder, unspecified: Secondary | ICD-10-CM

## 2018-12-17 DIAGNOSIS — F329 Major depressive disorder, single episode, unspecified: Secondary | ICD-10-CM

## 2018-12-17 DIAGNOSIS — F419 Anxiety disorder, unspecified: Secondary | ICD-10-CM

## 2018-12-19 ENCOUNTER — Telehealth: Payer: Self-pay | Admitting: Physician Assistant

## 2018-12-19 NOTE — Telephone Encounter (Signed)
I have placed forms from [employer] in the bin upfront with a charge sheet.

## 2018-12-19 NOTE — Telephone Encounter (Signed)
Forms in your folder for review and completion. Unsure if the same forms you have already

## 2018-12-20 NOTE — Telephone Encounter (Signed)
[employer] paperwork faxed.

## 2018-12-20 NOTE — Telephone Encounter (Signed)
Received forms. These are from [employer] (Short-term) and are different. Forms completed and given to CMA for fax.

## 2018-12-27 ENCOUNTER — Telehealth: Payer: Self-pay | Admitting: Physician Assistant

## 2018-12-27 NOTE — Telephone Encounter (Signed)
Please advise 

## 2018-12-27 NOTE — Telephone Encounter (Signed)
As discussed at visit, I would not be extending. Would be up to the counselor and then once she sees Psychiatry they will take over.

## 2018-12-27 NOTE — Telephone Encounter (Signed)
Referral in Epic. Placed on 12/17/2018 -- Dr. Casimiro Needle office is the one that was added to referral.

## 2018-12-27 NOTE — Telephone Encounter (Signed)
Pt called in to check status on referral. I don't see a referral placed for her. Please advise.

## 2018-12-27 NOTE — Telephone Encounter (Signed)
I have made pt aware that [facility] have received the referral.   She also wanted to know if Einar Pheasant would extend her FMLA forms until 02/03/19. She states that her counselor states that she should be out of work until then and is going to write her out but she also wanted Einar Pheasant to do it as well. Please advise

## 2018-12-31 NOTE — Telephone Encounter (Signed)
Left a detailed message on VM advising PCP would not extend any FMLA paperwork. This would need to come from her counselor and/or Psychiatrist we referred her to. They should take over for any our of work dates and time and any paperwork.

## 2019-01-22 ENCOUNTER — Ambulatory Visit (INDEPENDENT_AMBULATORY_CARE_PROVIDER_SITE_OTHER): Payer: 59 | Admitting: Adult Health

## 2019-01-22 ENCOUNTER — Other Ambulatory Visit: Payer: Self-pay

## 2019-01-22 ENCOUNTER — Encounter: Payer: Self-pay | Admitting: Adult Health

## 2019-01-22 VITALS — BP 98/48 | HR 84 | Ht 67.0 in | Wt 150.0 lb

## 2019-01-22 DIAGNOSIS — F410 Panic disorder [episodic paroxysmal anxiety] without agoraphobia: Secondary | ICD-10-CM | POA: Diagnosis not present

## 2019-01-22 DIAGNOSIS — F411 Generalized anxiety disorder: Secondary | ICD-10-CM | POA: Diagnosis not present

## 2019-01-22 DIAGNOSIS — F909 Attention-deficit hyperactivity disorder, unspecified type: Secondary | ICD-10-CM

## 2019-01-22 DIAGNOSIS — F4310 Post-traumatic stress disorder, unspecified: Secondary | ICD-10-CM

## 2019-01-22 DIAGNOSIS — F39 Unspecified mood [affective] disorder: Secondary | ICD-10-CM

## 2019-01-22 DIAGNOSIS — G4700 Insomnia, unspecified: Secondary | ICD-10-CM

## 2019-01-22 MED ORDER — ALPRAZOLAM 0.25 MG PO TABS: ORAL_TABLET | ORAL | 0 refills | Status: DC
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
## 2019-02-18 ENCOUNTER — Ambulatory Visit: Payer: 59 | Admitting: Adult Health

## 2019-02-18 ENCOUNTER — Other Ambulatory Visit: Payer: Self-pay

## 2019-02-24 ENCOUNTER — Other Ambulatory Visit: Payer: Self-pay

## 2019-02-24 ENCOUNTER — Ambulatory Visit (INDEPENDENT_AMBULATORY_CARE_PROVIDER_SITE_OTHER): Payer: 59 | Admitting: Adult Health

## 2019-02-24 ENCOUNTER — Encounter: Payer: Self-pay | Admitting: Adult Health

## 2019-02-24 DIAGNOSIS — G4700 Insomnia, unspecified: Secondary | ICD-10-CM

## 2019-02-24 DIAGNOSIS — F909 Attention-deficit hyperactivity disorder, unspecified type: Secondary | ICD-10-CM

## 2019-02-24 DIAGNOSIS — F410 Panic disorder [episodic paroxysmal anxiety] without agoraphobia: Secondary | ICD-10-CM

## 2019-02-24 DIAGNOSIS — F411 Generalized anxiety disorder: Secondary | ICD-10-CM

## 2019-02-24 DIAGNOSIS — F4310 Post-traumatic stress disorder, unspecified: Secondary | ICD-10-CM

## 2019-02-24 DIAGNOSIS — F39 Unspecified mood [affective] disorder: Secondary | ICD-10-CM

## 2019-02-24 MED ORDER — ALPRAZOLAM 0.25 MG PO TABS: ORAL_TABLET | ORAL | 0 refills | Status: DC

## 2019-02-24 MED ORDER — AMPHETAMINE-DEXTROAMPHETAMINE 5 MG PO TABS: 5.0000 mg | ORAL_TABLET | Freq: Every day | ORAL | 0 refills | Status: DC

## 2019-02-24 NOTE — Progress Notes (Signed)
[facility] MD/PA/NP Medication Check  02/24/2019 9:02 AM Abigail White  MRN:  [id number]  Chief Complaint:    HPI: Referred by PCP  Describes mood today as "not to good". Pleasant. Comunicative. Tearful throughout interview.  Mood symptoms - reports depression, anxiety, and irritability. Continues to have panic attacks. She and husband not "getting along well". Feels like he is doing "better" - in therapy with Fred May. Anxiety is better - "not constant like it was". Takes 1/2 to one Xanax some days. Doesn't take daily "I don't want to be dependent on anything". Had been doing better up until October 3rd - nephew age 61 overdosed - found by grandmother 32. Stating "I'm having a hard time". Also stating "every time I think I'm doing better, something else happens". She and therapist working on trauma. Wanting to feel better - "I really do". Varying interest and motivation. Taking medications as prescribed.  Energy levels vary.  Active, does not have a regular exercise routine. Works full-time typically - out on STD since August. Works 25 hours a week with husband.  Enjoys some usual interests and activities. Spending time with family - husband - 62 year old daughter and a 28 and 8 year old boys.  Appetite improving. Weight gain "a little" 8 pounds. Not sleeping well at night. Averages 3 to 4 a night then has nights where she sleeps 10 hours a night- "some nights are better". Not feeling as "exhausted" as she was. Difficulties with focus and concentration. Completing tasks. Managing aspects of household. Works as a Pharmacologist - out on STD. Wanting to restart Adderall  daily. Needs to get "things" done. Denies SI or HI. Denies AH or VH.  Seeing a therapist - Isiah Thomasena Edis  Visit Diagnosis:    ICD-10-CM   1. Generalized anxiety disorder  F41.1   2. Attention deficit hyperactivity disorder (ADHD), unspecified ADHD type  F90.9 ALPRAZolam (XANAX) 0.25 MG tablet   amphetamine-dextroamphetamine (ADDERALL) 5 MG tablet  3. Insomnia, unspecified type  G47.00 ALPRAZolam (XANAX) 0.25 MG tablet  4. Panic attacks  F41.0 ALPRAZolam (XANAX) 0.25 MG tablet  5. PTSD (post-traumatic stress disorder)  F43.10   6. Episodic mood disorder (HCC)  F39     Past Psychiatric History: Denies  Past Medical History:  Past Medical History:  Diagnosis Date  . ADHD (attention deficit hyperactivity disorder)   . Allergy   . Anemia   . Anxiety    H/O  . Carpal tunnel syndrome    bilateral  . Chest pain    H/O - no current problems - anxiety related  . Chronic fatigue   . Depression    Gestational  . Dyspnea   . GERD (gastroesophageal reflux disease)   . History of chickenpox   . HSV (herpes simplex virus) infection   . Hx of pyelonephritis   . Migraine   . Ovarian cyst   . Palpitations    H/O - no current problems-anxiety related  . PONV (postoperative nausea and vomiting)   . Postpartum care following vaginal delivery (2/13) 06/21/2016  . PTSD (post-traumatic stress disorder)   . Smoker   . Spontaneous vaginal delivery 06/21/2016   svd x 3  . Vaginal Pap smear, abnormal     Past Surgical History:  Procedure Laterality Date  . LEEP  2005, 2013   x 2  . NASAL SINUS SURGERY  1999  . WISDOM TOOTH EXTRACTION      Family Psychiatric History: Denies  Family History:  Family  History  Problem Relation Age of Onset  . Coronary artery disease Mother [id number]  . Hypertension Mother   . Diabetes Mother   . Heart disease Mother   . Kidney Stones Mother   . Asthma Mother   . Kidney disease Mother   . Hyperthyroidism Mother   . COPD Mother   . Heart attack Mother   . Dementia Mother   . Other Father        Shot-violence  . Alcohol abuse Father   . Hypertension Brother   . Kidney Stones Brother   . Hyperlipidemia Brother   . COPD Brother   . Heart attack Brother   . Diabetes Brother   . Hypertension Brother   . Hypertension Maternal Grandmother   .  Hyperlipidemia Maternal Grandmother   . Cancer Maternal Grandmother        Skin  . COPD Maternal Grandmother   . Diabetes Maternal Grandmother   . Cancer Maternal Grandfather        Bone  . Cancer Paternal Grandmother   . Stroke Paternal Grandmother   . Aneurysm Paternal Grandfather   . Hypertension Other   . Diabetes Other   . Asthma Son   . Hypertension Maternal Aunt   . COPD Maternal Aunt   . Diabetes Maternal Aunt   . Heart attack Maternal Aunt   . Cancer Paternal Aunt        Breast    Social History:  Social History   Socioeconomic History  . Marital status: Married    Spouse name: Not on file  . Number of children: 3  . Years of education: 4  . Highest education level: Not on file  Occupational History  . Occupation: nusre     Associate Professormployer: OTHER  Social Needs  . Financial resource strain: Not on file  . Food insecurity    Worry: Not on file    Inability: Not on file  . Transportation needs    Medical: Not on file    Non-medical: Not on file  Tobacco Use  . Smoking status: Current Every Day Smoker    Packs/day: 0.50    Years: 20.00    Pack years: 10.00    Types: Cigarettes  . Smokeless tobacco: Never Used  Substance and Sexual Activity  . Alcohol use: Yes  . Drug use: No  . Sexual activity: Yes    Birth control/protection: I.U.D.  Lifestyle  . Physical activity    Days per week: Not on file    Minutes per session: Not on file  . Stress: Not on file  Relationships  . Social Musicianconnections    Talks on phone: Not on file    Gets together: Not on file    Attends religious service: Not on file    Active member of club or organization: Not on file    Attends meetings of clubs or organizations: Not on file    Relationship status: Not on file  Other Topics Concern  . Not on file  Social History Narrative  . Not on file    Allergies:  Allergies  Allergen Reactions  . Effexor [Venlafaxine]     Multiple somatic issues  . Lexapro [Escitalopram Oxalate]      Mania   . Paxil [Paroxetine Hcl]     Feels detached   . Penicillins Hives    Has patient had a PCN reaction causing immediate rash, facial/tongue/throat swelling, SOB or lightheadedness with hypotension: no Has patient had a PCN reaction  causing severe rash involving mucus membranes or skin necrosis: yes Has patient had a PCN reaction that required hospitalization no Has patient had a PCN reaction occurring within the last 10 years: no If all of the above answers are "NO", then may proceed with Cephalosporin use.   . Sulfa Antibiotics   . Vicodin [Hydrocodone-Acetaminophen] Nausea And Vomiting    Projectile vomiting  . Wellbutrin [Bupropion]     Became belligerant  . Zoloft [Sertraline Hcl] Hives    rash  . Differin [Adapalene] Rash  . Latex Rash    Metabolic Disorder Labs: Lab Results  Component Value Date   HGBA1C 5.4 12/13/2018   No results found for: PROLACTIN Lab Results  Component Value Date   CHOL 172 07/24/2017   TRIG 74.0 07/24/2017   HDL 43.50 07/24/2017   CHOLHDL 4 07/24/2017   VLDL 14.8 07/24/2017   LDLCALC 114 (H) 07/24/2017   LDLCALC 114 (H) 08/07/2016   Lab Results  Component Value Date   TSH 0.46 12/13/2018   TSH 0.83 07/24/2017    Therapeutic Level Labs: No results found for: LITHIUM No results found for: VALPROATE No components found for:  CBMZ  Current Medications: Current Outpatient Medications  Medication Sig Dispense Refill  . albuterol (PROAIR HFA) 108 (90 Base) MCG/ACT inhaler Inhale 2 puffs into the lungs every 6 (six) hours as needed. 8.5 Inhaler 0  . ALPRAZolam (XANAX) 0.25 MG tablet Take 1/2 to one tablet daily as needed for anxiety/panic attacks. 30 tablet 0  . amphetamine-dextroamphetamine (ADDERALL) 5 MG tablet Take 1 tablet (5 mg total) by mouth daily. 30 tablet 0  . FLOVENT HFA 110 MCG/ACT inhaler TAKE 2 PUFFS BY MOUTH TWICE A DAY 12 Inhaler 3  . ibuprofen (ADVIL,MOTRIN) 800 MG tablet Take 1 tablet (800 mg total) by mouth  every 8 (eight) hours as needed for cramping. 90 tablet 0  . L-Methylfolate 15 MG TABS Take 1 tablet by mouth daily.    . Levonorgestrel (SKYLA) 13.5 MG IUD by Intrauterine route.    . ondansetron (ZOFRAN) 4 MG tablet Take 1 tablet (4 mg total) by mouth every 8 (eight) hours as needed for nausea or vomiting. 20 tablet 0  . valACYclovir (VALTREX) 500 MG tablet Take 1 tablet as needed for outbreak     No current facility-administered medications for this visit.     Medication Side Effects: none  Orders placed this visit:  No orders of the defined types were placed in this encounter.   Psychiatric Specialty Exam:  ROS  There were no vitals taken for this visit.There is no height or weight on file to calculate BMI.  General Appearance: Neat and Well Groomed  Eye Contact:  Good  Speech:  Normal Rate and Talkative  Volume:  Normal  Mood:  Angry, Anxious and Depressed  Affect:  Appropriate  Thought Process:  Coherent  Orientation:  Full (Time, Place, and Person)  Thought Content: Logical   Suicidal Thoughts:  No  Homicidal Thoughts:  No  Memory:  WNL  Judgement:  Good  Insight:  Good  Psychomotor Activity:  Normal  Concentration:  Concentration: Good  Recall:  Good  Fund of Knowledge: Good  Language: Good  Assets:  Communication Skills Desire for Improvement Financial Resources/Insurance Housing Intimacy Leisure Time Physical Health Resilience Social Support Talents/Skills Transportation Vocational/Educational  ADL's:  Intact  Cognition: WNL  Prognosis:  Good   Screenings:  GAD-7     Office Visit from 08/28/2017 in [facility]  Office Visit from 11/03/2016 in [place]  Total GAD-7 Score  18  14    PHQ2-9     Office Visit from 12/13/2018 in [place] Office Visit from 08/28/2017 in [place] Office Visit  from 07/24/2017 in [place] Office Visit from 11/03/2016 in [place] Office Visit from 07/25/2016 in [place]  PHQ-2 Total Score  3  4  2   0  0  PHQ-9 Total Score  12  12  3   0  6      Receiving Psychotherapy: No   Treatment Plan/Recommendations:   Plan:  1. Continue Xanax 0.25mg  daily PRN anxiety - has been able to tolerate - no history of abuse. 2. Add Adderall 5mg  daily  Continue L-methylfolate  Consider Lamictal  Multiple failures with SSRI, SNRI, and Wellbutrin - allergic reactions.  Continue therapy  RTC 4 weeks  Patient advised to contact office with any questions, adverse effects, or acute worsening in signs and symptoms. Discussed potential benefits, risk, and side effects of benzodiazepines to include potential risk of tolerance and dependence, as well as possible drowsiness.  Advised patient not to drive if experiencing drowsiness and to take lowest possible effective dose to minimize risk of dependence and tolerance.    Aloha Gell, NP

## 2019-03-26 ENCOUNTER — Other Ambulatory Visit: Payer: Self-pay

## 2019-03-26 ENCOUNTER — Encounter: Payer: Self-pay | Admitting: Adult Health

## 2019-03-26 ENCOUNTER — Ambulatory Visit (INDEPENDENT_AMBULATORY_CARE_PROVIDER_SITE_OTHER): Payer: 59 | Admitting: Adult Health

## 2019-03-26 DIAGNOSIS — F4310 Post-traumatic stress disorder, unspecified: Secondary | ICD-10-CM | POA: Diagnosis not present

## 2019-03-26 DIAGNOSIS — F411 Generalized anxiety disorder: Secondary | ICD-10-CM

## 2019-03-26 DIAGNOSIS — F410 Panic disorder [episodic paroxysmal anxiety] without agoraphobia: Secondary | ICD-10-CM

## 2019-03-26 DIAGNOSIS — F39 Unspecified mood [affective] disorder: Secondary | ICD-10-CM | POA: Diagnosis not present

## 2019-03-26 DIAGNOSIS — F909 Attention-deficit hyperactivity disorder, unspecified type: Secondary | ICD-10-CM

## 2019-03-26 DIAGNOSIS — G4700 Insomnia, unspecified: Secondary | ICD-10-CM | POA: Diagnosis not present

## 2019-03-26 MED ORDER — AMPHETAMINE-DEXTROAMPHETAMINE 5 MG PO TABS: 5.0000 mg | ORAL_TABLET | Freq: Every day | ORAL | 0 refills | Status: DC

## 2019-03-26 MED ORDER — AMPHETAMINE-DEXTROAMPHET ER 10 MG PO CP24: 10.0000 mg | ORAL_CAPSULE | Freq: Every day | ORAL | 0 refills | Status: DC

## 2019-03-26 MED ORDER — ALPRAZOLAM 0.25 MG PO TABS: ORAL_TABLET | ORAL | 2 refills | Status: DC

## 2019-03-26 MED ORDER — LAMOTRIGINE 25 MG PO TABS: ORAL_TABLET | ORAL | 2 refills | Status: DC

## 2019-03-26 NOTE — Progress Notes (Signed)
[facility] MD/PA/NP Medication Check  03/26/2019 1:45 PM THERESIA PREE  MRN:  [id number]    Virtual Visit via Telephone Note  I connected with pt on 03/26/2019 at 1:20 PM EDT by telephone and verified that I am speaking with the correct person using two identifiers.   I discussed the limitations, risks, security and privacy concerns of performing an evaluation and management service by telephone and the availability of in person appointments. I also discussed with the patient that there may be a patient responsible charge related to this service. The patient expressed understanding and agreed to proceed.   I discussed the assessment and treatment plan with the patient. The patient was provided an opportunity to ask questions and all were answered. The patient agreed with the plan and demonstrated an understanding of the instructions.   The patient was advised to call back or seek an in-person evaluation if the symptoms worsen or if the condition fails to improve as anticipated.  I provided 30 minutes of non-face-to-face time during this encounter.  The patient was located at home.  The provider was located at [facility].  Chief Complaint:   HPI: Referred by PCP  Describes mood today as "ok". Pleasant.  Mood symptoms - reports depression, anxiety, and irritability. Willing to try Lamictal at a low dose for mood improvement. Continues to have "panic attacks". Using Xanax "only when I have too". Children diagnosed with Covid-19 this week. Stating "it's one thing after another". Remains out of work on STD. Has seen improvements in "some areas", but does not feel like she can return to work. Adderall has helped her be able to "get more done". Improved interest and motivation. Taking medications as prescribed.  Energy levels improved "some". Active, does not have a regular exercise routine. Out of work on STD. Enjoys some usual interests and activities. Spending time with family  husband and 3 children. Appetite adequate. Weight gain.  Sleep has been a "little" better. Averages hours 5 to 6 or 8 to 10 or not at all. Focus and concentration improving. Would like to return to previous doses of Adderall. Completing tasks. Managing aspects of household.  Denies SI or HI. Denies AH or VH. Seeing a therapist - Isiah Collins  Visit Diagnosis:    ICD-10-CM   1. Episodic mood disorder (HCC)  F39 lamoTRIgine (LAMICTAL) 25 MG tablet  2. PTSD (post-traumatic stress disorder)  F43.10   3. Panic attacks  F41.0 ALPRAZolam (XANAX) 0.25 MG tablet  4. Insomnia, unspecified type  G47.00 ALPRAZolam (XANAX) 0.25 MG tablet  5. Attention deficit hyperactivity disorder (ADHD), unspecified ADHD type  F90.9 amphetamine-dextroamphetamine (ADDERALL) 5 MG tablet    ALPRAZolam (XANAX) 0.25 MG tablet    amphetamine-dextroamphetamine (ADDERALL XR) 10 MG 24 hr capsule  6. Generalized anxiety disorder  F41.1     Past Psychiatric History: Denies  Past Medical History:  Past Medical History:  Diagnosis Date  . ADHD (attention deficit hyperactivity disorder)   . Allergy   . Anemia   . Anxiety    H/O  . Carpal tunnel syndrome    bilateral  . Chest pain    H/O - no current problems - anxiety related  . Chronic fatigue   . Depression    Gestational  . Dyspnea   . GERD (gastroesophageal reflux disease)   . History of chickenpox   . HSV (herpes simplex virus) infection   . Hx of pyelonephritis   . Migraine   . Ovarian cyst   .  Palpitations    H/O - no current problems-anxiety related  . PONV (postoperative nausea and vomiting)   . Postpartum care following vaginal delivery (2/13) 06/21/2016  . PTSD (post-traumatic stress disorder)   . Smoker   . Spontaneous vaginal delivery 06/21/2016   svd x 3  . Vaginal Pap smear, abnormal     Past Surgical History:  Procedure Laterality Date  . LEEP  2005, 2013   x 2  . NASAL SINUS SURGERY  1999  . WISDOM TOOTH EXTRACTION      Family  Psychiatric History: Denies  Family History:  Family History  Problem Relation Age of Onset  . Coronary artery disease Mother 4855  . Hypertension Mother   . Diabetes Mother   . Heart disease Mother   . Kidney Stones Mother   . Asthma Mother   . Kidney disease Mother   . Hyperthyroidism Mother   . COPD Mother   . Heart attack Mother   . Dementia Mother   . Other Father        Shot-violence  . Alcohol abuse Father   . Hypertension Brother   . Kidney Stones Brother   . Hyperlipidemia Brother   . COPD Brother   . Heart attack Brother   . Diabetes Brother   . Hypertension Brother   . Hypertension Maternal Grandmother   . Hyperlipidemia Maternal Grandmother   . Cancer Maternal Grandmother        Skin  . COPD Maternal Grandmother   . Diabetes Maternal Grandmother   . Cancer Maternal Grandfather        Bone  . Cancer Paternal Grandmother   . Stroke Paternal Grandmother   . Aneurysm Paternal Grandfather   . Hypertension Other   . Diabetes Other   . Asthma Son   . Hypertension Maternal Aunt   . COPD Maternal Aunt   . Diabetes Maternal Aunt   . Heart attack Maternal Aunt   . Cancer Paternal Aunt        Breast    Social History:  Social History   Socioeconomic History  . Marital status: Married    Spouse name: Not on file  . Number of children: 3  . Years of education: 4  . Highest education level: Not on file  Occupational History  . Occupation: nusre     Associate Professormployer: [employer]  Social Needs  . Financial resource strain: Not on file  . Food insecurity    Worry: Not on file    Inability: Not on file  . Transportation needs    Medical: Not on file    Non-medical: Not on file  Tobacco Use  . Smoking status: Current Every Day Smoker    Packs/day: 0.50    Years: 20.00    Pack years: 10.00    Types: Cigarettes  . Smokeless tobacco: Never Used  Substance and Sexual Activity  . Alcohol use: Yes  . Drug use: No  . Sexual activity: Yes    Birth control/protection:  I.U.D.  Lifestyle  . Physical activity    Days per week: Not on file    Minutes per session: Not on file  . Stress: Not on file  Relationships  . Social Musicianconnections    Talks on phone: Not on file    Gets together: Not on file    Attends religious service: Not on file    Active member of club or organization: Not on file    Attends meetings of clubs or organizations:  Not on file    Relationship status: Not on file  Other Topics Concern  . Not on file  Social History Narrative  . Not on file    Allergies:  Allergies  Allergen Reactions  . Effexor [Venlafaxine]     Multiple somatic issues  . Lexapro [Escitalopram Oxalate]     Mania   . Paxil [Paroxetine Hcl]     Feels detached   . Penicillins Hives    Has patient had a PCN reaction causing immediate rash, facial/tongue/throat swelling, SOB or lightheadedness with hypotension: no Has patient had a PCN reaction causing severe rash involving mucus membranes or skin necrosis: yes Has patient had a PCN reaction that required hospitalization no Has patient had a PCN reaction occurring within the last 10 years: no If all of the above answers are "NO", then may proceed with Cephalosporin use.   . Sulfa Antibiotics   . Vicodin [Hydrocodone-Acetaminophen] Nausea And Vomiting    Projectile vomiting  . Wellbutrin [Bupropion]     Became belligerant  . Zoloft [Sertraline Hcl] Hives    rash  . Differin [Adapalene] Rash  . Latex Rash    Metabolic Disorder Labs: Lab Results  Component Value Date   HGBA1C 5.4 12/13/2018   No results found for: PROLACTIN Lab Results  Component Value Date   CHOL 172 07/24/2017   TRIG 74.0 07/24/2017   HDL 43.50 07/24/2017   CHOLHDL 4 07/24/2017   VLDL 14.8 07/24/2017   LDLCALC 114 (H) 07/24/2017   LDLCALC 114 (H) 08/07/2016   Lab Results  Component Value Date   TSH 0.46 12/13/2018   TSH 0.83 07/24/2017    Therapeutic Level Labs: No results found for: LITHIUM No results found for:  VALPROATE No components found for:  CBMZ  Current Medications: Current Outpatient Medications  Medication Sig Dispense Refill  . albuterol (PROAIR HFA) 108 (90 Base) MCG/ACT inhaler Inhale 2 puffs into the lungs every 6 (six) hours as needed. 8.5 Inhaler 0  . ALPRAZolam (XANAX) 0.25 MG tablet Take 1/2 to one tablet daily as needed for anxiety/panic attacks. 30 tablet 2  . amphetamine-dextroamphetamine (ADDERALL XR) 10 MG 24 hr capsule Take 1 capsule (10 mg total) by mouth daily. 30 capsule 0  . amphetamine-dextroamphetamine (ADDERALL) 5 MG tablet Take 1 tablet (5 mg total) by mouth daily. 30 tablet 0  . FLOVENT HFA 110 MCG/ACT inhaler TAKE 2 PUFFS BY MOUTH TWICE A DAY 12 Inhaler 3  . ibuprofen (ADVIL,MOTRIN) 800 MG tablet Take 1 tablet (800 mg total) by mouth every 8 (eight) hours as needed for cramping. 90 tablet 0  . L-Methylfolate 15 MG TABS Take 1 tablet by mouth daily.    Marland Kitchen lamoTRIgine (LAMICTAL) 25 MG tablet Take one  tablet at bedtime x 14 days, then take two  tablets at bedtime. 60 tablet 2  . Levonorgestrel (SKYLA) 13.5 MG IUD by Intrauterine route.    . ondansetron (ZOFRAN) 4 MG tablet Take 1 tablet (4 mg total) by mouth every 8 (eight) hours as needed for nausea or vomiting. 20 tablet 0  . valACYclovir (VALTREX) 500 MG tablet Take 1 tablet as needed for outbreak     No current facility-administered medications for this visit.     Medication Side Effects: none  Orders placed this visit:  No orders of the defined types were placed in this encounter.   Psychiatric Specialty Exam:  Review of Systems  Musculoskeletal: Negative for falls.  Neurological: Negative for tremors and seizures.  Psychiatric/Behavioral: Positive  for depression. The patient is nervous/anxious and has insomnia.     There were no vitals taken for this visit.There is no height or weight on file to calculate BMI.  General Appearance: Neat and Well Groomed  Eye Contact:  Good  Speech:  Normal Rate  and Talkative  Volume:  Normal  Mood:  Angry, Anxious and Depressed  Affect:  Appropriate  Thought Process:  Coherent and Descriptions of Associations: Intact  Orientation:  Full (Time, Place, and Person)  Thought Content: Logical   Suicidal Thoughts:  No  Homicidal Thoughts:  No  Memory:  WNL  Judgement:  Good  Insight:  Good  Psychomotor Activity:  Normal  Concentration:  Concentration: Good  Recall:  Good  Fund of Knowledge: Good  Language: Good  Assets:  Communication Skills Desire for Improvement Financial Resources/Insurance Housing Intimacy Leisure Time Physical Health Resilience Social Support Talents/Skills Transportation Vocational/Educational  ADL's:  Intact  Cognition: WNL  Prognosis:  Good   Screenings:  GAD-7     Office Visit from 08/28/2017 in [facility] Office Visit from 11/03/2016 in [place]  Total GAD-7 Score  18  14    PHQ2-9     Office Visit from 12/13/2018 in [facility] Office Visit from 08/28/2017 in [place] Office Visit from 07/24/2017 in [place] Office Visit from 11/03/2016 in [place] Office Visit from 07/25/2016 in [place]  PHQ-2 Total Score  3  4  2   0  0  PHQ-9 Total Score  12  12  3   0  6      Receiving Psychotherapy: No   Treatment Plan/Recommendations:   Plan:  1. Continue Xanax 0.25mg  daily PRN anxiety - has been able to tolerate - no history of abuse. 2. Continue Adderall 5mg  BID 3. Add Adderall XR 10mg   4. Continue L-methylfolate 5. Add Lamictal 12.5mg  at hs - script for 25mg  at hs, then 50mg  at hs to be sent in - will titrate as tolerated  Multiple failures with SSRI, SNRI, and Wellbutrin - allergic reactions.  Agree to remain on STD at  this time  Continue therapy  RTC 4 weeks  Patient advised to contact office with any questions, adverse effects, or acute worsening in signs and symptoms.  Discussed potential benefits, risk, and side effects of benzodiazepines to include potential risk of tolerance and dependence, as well as possible drowsiness.  Advised patient not to drive if experiencing drowsiness and to take lowest possible effective dose to minimize risk of dependence and tolerance.  Discussed potential benefits, risks, and side effects of stimulants with patient to include increased heart rate, palpitations, insomnia, increased anxiety, increased irritability, or decreased appetite.  Instructed patient to contact office if experiencing any significant tolerability issues.  Counseled patient regarding potential benefits, risks, and side effects of Lamictal to include potential risk of Stevens-Johnson syndrome. Advised patient to stop taking Lamictal and contact office immediately if rash develops and to seek urgent medical attention if rash is severe and/or spreading quickly.   Aloha Gell, NP

## 2019-04-08 ENCOUNTER — Telehealth: Payer: Self-pay

## 2019-04-08 DIAGNOSIS — Z0289 Encounter for other administrative examinations: Secondary | ICD-10-CM

## 2019-04-08 NOTE — Telephone Encounter (Signed)
Opened in error

## 2019-04-08 NOTE — Telephone Encounter (Signed)
Spoke with Abigail White and she stated Abigail White started her short term disability and since she see's you they require the paper work from you but Abigail White will determine her RTW date. Marland Kitchen She wanted me to let you know she is having side affects from the Lamictal. She is itching all over, cannot sleep, feels withdrawn and when she is in a room feels like she is not apart of it, feeling strange, anxiety has increased. She stopped her Adderall for a couple days but it didn't help with her anxiety. Her eyes are dry and she said she is having similar symptoms of when she was on paxil. Please advise.

## 2019-04-08 NOTE — Telephone Encounter (Signed)
Update: Per Regina's instructions, I have called the patient and had her d/c the Lamotrigine due to her side effects and asked her to call us back in 2-3 days to let us know how she doing.

## 2019-04-11 ENCOUNTER — Telehealth: Payer: Self-pay

## 2019-04-11 ENCOUNTER — Telehealth: Payer: Self-pay | Admitting: Psychiatry

## 2019-04-11 NOTE — Telephone Encounter (Signed)
Called patient and made a telephone note.

## 2019-04-11 NOTE — Telephone Encounter (Signed)
See previous message. Pt. Called to give an update on her Lamictal reaction. She developed a little rash but since taking benadryl it is clearing up. She also had developed sores in her nose. Even with the benadryl she is still itching a bit and cannot sleep. Went to sleep at 4 this morning and then up by 6:30. She is tired but cannot sleep and her anxiety is starting to increase. Please advise.

## 2019-04-11 NOTE — Telephone Encounter (Signed)
TC   Off lamotrigine 5 days. Rash resolving.  1 sore on one side of the septum.  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  Taking benadryl but rash is resolving.   Take Zyrtec 1 at night Add Benadryl also if needed for the itching if needed.    Disc poss low dose lithium bc med sensitive  She asked about high dosage L-methylfolate 15 mg BID because 15 mg once a day have been helpful.  Being a derivative of a B vitamin that should be safe to do but I know of no studies using dosages that high.  Encouraged her to take Benadryl and Xanax tonight to deal with the sleep issues.  It takes a few days for lamotrigine to completely get out of the system.  Her symptoms overall are improving.  No further intervention should be necessary to deal with the lamotrigine reaction but she is very aware being a nurse, of more severe allergic symptoms and will go to the ER if needed.

## 2019-04-23 ENCOUNTER — Ambulatory Visit (INDEPENDENT_AMBULATORY_CARE_PROVIDER_SITE_OTHER): Payer: Managed Care, Other (non HMO) | Admitting: Adult Health

## 2019-04-23 ENCOUNTER — Encounter: Payer: Self-pay | Admitting: Adult Health

## 2019-04-23 DIAGNOSIS — F39 Unspecified mood [affective] disorder: Secondary | ICD-10-CM

## 2019-04-23 DIAGNOSIS — G4700 Insomnia, unspecified: Secondary | ICD-10-CM | POA: Diagnosis not present

## 2019-04-23 DIAGNOSIS — F4310 Post-traumatic stress disorder, unspecified: Secondary | ICD-10-CM

## 2019-04-23 DIAGNOSIS — F411 Generalized anxiety disorder: Secondary | ICD-10-CM | POA: Diagnosis not present

## 2019-04-23 DIAGNOSIS — F909 Attention-deficit hyperactivity disorder, unspecified type: Secondary | ICD-10-CM

## 2019-04-23 DIAGNOSIS — F410 Panic disorder [episodic paroxysmal anxiety] without agoraphobia: Secondary | ICD-10-CM

## 2019-04-23 MED ORDER — L-METHYLFOLATE 15 MG PO TABS: 1.0000 | ORAL_TABLET | Freq: Every day | ORAL | 1 refills | Status: DC

## 2019-04-23 MED ORDER — AMPHETAMINE-DEXTROAMPHETAMINE 5 MG PO TABS: 5.0000 mg | ORAL_TABLET | Freq: Two times a day (BID) | ORAL | 0 refills | Status: DC

## 2019-04-23 MED ORDER — AMPHETAMINE-DEXTROAMPHET ER 10 MG PO CP24: 10.0000 mg | ORAL_CAPSULE | Freq: Every day | ORAL | 0 refills | Status: DC

## 2019-04-23 NOTE — Progress Notes (Signed)
Abigail White [id number] 1981-03-27 38 y.o.  Virtual Visit via Telephone Note  I connected with pt on 04/23/19 at 11:40 AM EST by telephone and verified that I am speaking with the correct person using two identifiers.   I discussed the limitations, risks, security and privacy concerns of performing an evaluation and management service by telephone and the availability of in person appointments. I also discussed with the patient that there may be a patient responsible charge related to this service. The patient expressed understanding and agreed to proceed.   I discussed the assessment and treatment plan with the patient. The patient was provided an opportunity to ask questions and all were answered. The patient agreed with the plan and demonstrated an understanding of the instructions.   The patient was advised to call back or seek an in-person evaluation if the symptoms worsen or if the condition fails to improve as anticipated.  I provided 30 minutes of non-face-to-face time during this encounter.  The patient was located at home.  The provider was located at [facility].   Dorothyann Gibbs, NP   Subjective:   Patient ID:  Abigail White is a 38 y.o. (DOB 03-18-1981) female.  Chief Complaint: No chief complaint on file.   HPI Abigail White presents for follow-up of  GAD, MDD, ADHD, insomnia, panic attacks, PTSD.  Describes mood today as "so-so". Pleasant. Mood symptoms - reports depression, anxiety, and irritability. Reports panic attacks - "every day, all day while on the Lamictal". Continues to have daily panic attacks. Thinking about nephew - died last 2023-02-27 from heroin overdose. Stating "his birthday is coming up". Stating "I'm still grieving". Currently sick with a sinus infection. Has not been tested for Covid-19. Does not feel like she is able to return to work at this time. Still "struggling to make it day to day". Improved interest and motivation with  addition of Adderall. Taking medications as prescribed.  Energy levels decreased. Functions at a higher level for 2 days and then not able to function at all. Active, does not have a regular exercise routine. Out of work on STD. Enjoys some usual interests and activities. Married. Lives with husband and 3 children. Watching movies with children.  Appetite adequate. Weight stable 155 - lost 5 pounds while taking Lamictal.  Sleeping difficulties. Averages 4 hours. Staying up to 4 in the morning and sleeping for a few hours.Stating "I'm struggling with sleep". Focus and concentration stable. Completing tasks. Managing some aspects of household - doing better than she was. Will wait until laundry piles up, then washes clothes when she has too. Working .  Denies SI or HI. Denies AH or VH. Seeing a therapist - Isiah Collins  Review of Systems:  Review of Systems  Musculoskeletal: Negative for gait problem.  Neurological: Negative for tremors.  Psychiatric/Behavioral:       Please refer to HPI    Medications: I have reviewed the patient's current medications.  Current Outpatient Medications  Medication Sig Dispense Refill  . albuterol (PROAIR HFA) 108 (90 Base) MCG/ACT inhaler Inhale 2 puffs into the lungs every 6 (six) hours as needed. 8.5 Inhaler 0  . ALPRAZolam (XANAX) 0.25 MG tablet Take 1/2 to one tablet daily as needed for anxiety/panic attacks. 30 tablet 2  . amphetamine-dextroamphetamine (ADDERALL XR) 10 MG 24 hr capsule Take 1 capsule (10 mg total) by mouth daily. 30 capsule 0  . amphetamine-dextroamphetamine (ADDERALL) 5 MG tablet Take 1 tablet (5 mg total) by mouth  2 (two) times daily. 60 tablet 0  . FLOVENT HFA 110 MCG/ACT inhaler TAKE 2 PUFFS BY MOUTH TWICE A DAY 12 Inhaler 3  . ibuprofen (ADVIL,MOTRIN) 800 MG tablet Take 1 tablet (800 mg total) by mouth every 8 (eight) hours as needed for cramping. 90 tablet 0  . L-Methylfolate 15 MG TABS Take 1 tablet (15 mg total) by mouth daily.  90 tablet 1  . Levonorgestrel (SKYLA) 13.5 MG IUD by Intrauterine route.    . ondansetron (ZOFRAN) 4 MG tablet Take 1 tablet (4 mg total) by mouth every 8 (eight) hours as needed for nausea or vomiting. 20 tablet 0  . valACYclovir (VALTREX) 500 MG tablet Take 1 tablet as needed for outbreak     No current facility-administered medications for this visit.    Medication Side Effects: None  Allergies:  Allergies  Allergen Reactions  . Effexor [Venlafaxine]     Multiple somatic issues  . Lexapro [Escitalopram Oxalate]     Mania   . Paxil [Paroxetine Hcl]     Feels detached   . Penicillins Hives    Has patient had a PCN reaction causing immediate rash, facial/tongue/throat swelling, SOB or lightheadedness with hypotension: no Has patient had a PCN reaction causing severe rash involving mucus membranes or skin necrosis: yes Has patient had a PCN reaction that required hospitalization no Has patient had a PCN reaction occurring within the last 10 years: no If all of the above answers are "NO", then may proceed with Cephalosporin use.   . Sulfa Antibiotics   . Vicodin [Hydrocodone-Acetaminophen] Nausea And Vomiting    Projectile vomiting  . Wellbutrin [Bupropion]     Became belligerant  . Zoloft [Sertraline Hcl] Hives    rash  . Differin [Adapalene] Rash  . Latex Rash    Past Medical History:  Diagnosis Date  . ADHD (attention deficit hyperactivity disorder)   . Allergy   . Anemia   . Anxiety    H/O  . Carpal tunnel syndrome    bilateral  . Chest pain    H/O - no current problems - anxiety related  . Chronic fatigue   . Depression    Gestational  . Dyspnea   . GERD (gastroesophageal reflux disease)   . History of chickenpox   . HSV (herpes simplex virus) infection   . Hx of pyelonephritis   . Migraine   . Ovarian cyst   . Palpitations    H/O - no current problems-anxiety related  . PONV (postoperative nausea and vomiting)   . Postpartum care following vaginal  delivery (2/13) 06/21/2016  . PTSD (post-traumatic stress disorder)   . Smoker   . Spontaneous vaginal delivery 06/21/2016   svd x 3  . Vaginal Pap smear, abnormal     Family History  Problem Relation Age of Onset  . Coronary artery disease Mother 57  . Hypertension Mother   . Diabetes Mother   . Heart disease Mother   . Kidney Stones Mother   . Asthma Mother   . Kidney disease Mother   . Hyperthyroidism Mother   . COPD Mother   . Heart attack Mother   . Dementia Mother   . Other Father        Shot-violence  . Alcohol abuse Father   . Hypertension Brother   . Kidney Stones Brother   . Hyperlipidemia Brother   . COPD Brother   . Heart attack Brother   . Diabetes Brother   . Hypertension Brother   .  Hypertension Maternal Grandmother   . Hyperlipidemia Maternal Grandmother   . Cancer Maternal Grandmother        Skin  . COPD Maternal Grandmother   . Diabetes Maternal Grandmother   . Cancer Maternal Grandfather        Bone  . Cancer Paternal Grandmother   . Stroke Paternal Grandmother   . Aneurysm Paternal Grandfather   . Hypertension Other   . Diabetes Other   . Asthma Son   . Hypertension Maternal Aunt   . COPD Maternal Aunt   . Diabetes Maternal Aunt   . Heart attack Maternal Aunt   . Cancer Paternal Aunt        Breast    Social History   Socioeconomic History  . Marital status: Married    Spouse name: Not on file  . Number of children: 3  . Years of education: 4  . Highest education level: Not on file  Occupational History  . Occupation: nusre     Employer: OTHER  Tobacco Use  . Smoking status: Current Every Day Smoker    Packs/day: 0.50    Years: 20.00    Pack years: 10.00    Types: Cigarettes  . Smokeless tobacco: Never Used  Substance and Sexual Activity  . Alcohol use: Yes  . Drug use: No  . Sexual activity: Yes    Birth control/protection: I.U.D.  Other Topics Concern  . Not on file  Social History Narrative  . Not on file   Social  Determinants of Health   Financial Resource Strain:   . Difficulty of Paying Living Expenses: Not on file  Food Insecurity:   . Worried About Programme researcher, broadcasting/film/videounning Out of Food in the Last Year: Not on file  . Ran Out of Food in the Last Year: Not on file  Transportation Needs:   . Lack of Transportation (Medical): Not on file  . Lack of Transportation (Non-Medical): Not on file  Physical Activity:   . Days of Exercise per Week: Not on file  . Minutes of Exercise per Session: Not on file  Stress:   . Feeling of Stress : Not on file  Social Connections:   . Frequency of Communication with Friends and Family: Not on file  . Frequency of Social Gatherings with Friends and Family: Not on file  . Attends Religious Services: Not on file  . Active Member of Clubs or Organizations: Not on file  . Attends BankerClub or Organization Meetings: Not on file  . Marital Status: Not on file  Intimate Partner Violence:   . Fear of Current or Ex-Partner: Not on file  . Emotionally Abused: Not on file  . Physically Abused: Not on file  . Sexually Abused: Not on file    Past Medical History, Surgical history, Social history, and Family history were reviewed and updated as appropriate.   Please see review of systems for further details on the patient's review from today.   Objective:   Physical Exam:  There were no vitals taken for this visit.  Physical Exam Constitutional:      General: She is not in acute distress.    Appearance: She is well-developed.  Musculoskeletal:        General: No deformity.  Neurological:     Mental Status: She is alert and oriented to person, place, and time.     Coordination: Coordination normal.  Psychiatric:        Attention and Perception: Attention and perception normal. She does not  perceive auditory or visual hallucinations.        Mood and Affect: Mood is anxious and depressed. Affect is not labile, blunt, angry or inappropriate.        Speech: Speech normal.         Behavior: Behavior normal.        Thought Content: Thought content normal. Thought content is not paranoid or delusional. Thought content does not include homicidal or suicidal ideation. Thought content does not include homicidal or suicidal plan.        Cognition and Memory: Cognition and memory normal.        Judgment: Judgment normal.     Comments: Insight intact    Lab Review:     Component Value Date/Time   NA 139 12/13/2018 1154   K 3.6 12/13/2018 1154   CL 104 12/13/2018 1154   CO2 26 12/13/2018 1154   GLUCOSE 86 12/13/2018 1154   BUN 8 12/13/2018 1154   CREATININE 0.71 12/13/2018 1154   CREATININE 0.67 11/26/2013 1604   CALCIUM 9.5 12/13/2018 1154   PROT 7.0 12/13/2018 1154   ALBUMIN 4.5 12/13/2018 1154   AST 10 12/13/2018 1154   ALT 8 12/13/2018 1154   ALKPHOS 60 12/13/2018 1154   BILITOT 0.5 12/13/2018 1154   GFRNONAA >60 07/31/2018 1414   GFRAA >60 07/31/2018 1414       Component Value Date/Time   WBC 7.7 12/13/2018 1154   RBC 4.57 12/13/2018 1154   HGB 14.5 12/13/2018 1154   HCT 43.4 12/13/2018 1154   PLT 298.0 12/13/2018 1154   MCV 95.0 12/13/2018 1154   MCV 94.4 07/14/2014 1524   MCH 30.4 07/31/2018 1414   MCHC 33.5 12/13/2018 1154   RDW 12.9 12/13/2018 1154   LYMPHSABS 1.7 12/13/2018 1154   MONOABS 0.5 12/13/2018 1154   EOSABS 0.1 12/13/2018 1154   BASOSABS 0.1 12/13/2018 1154    No results found for: POCLITH, LITHIUM   No results found for: PHENYTOIN, PHENOBARB, VALPROATE, CBMZ   .res Assessment: Plan:    Plan:  1. Continue Xanax 0.25mg  daily PRN anxiety - has been able to tolerate - no history of abuse. 2. Continue Adderall  BID 3. Continue Adderall XR 10 mg    4. 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   Out of work 04/23/2019 thru 05/20/2018 - will re-assess RTW date at next visit  Lamictal - D/C'd  due to rash   Discussed Lithium   Multiple failures with SSRI, SNRI, and Wellbutrin - allergic reactions.  Continue therapy  RTC 4 weeks  Patient advised to contact office with any questions, adverse effects, or acute worsening in signs and symptoms.  Discussed potential benefits, risk, and side effects of benzodiazepines to include potential risk of tolerance and dependence, as well as possible drowsiness.  Advised patient not to drive if experiencing drowsiness and to take lowest possible effective dose to minimize risk of dependence and tolerance.  Discussed potential benefits, risks, and side effects of stimulants with patient to include increased heart rate, palpitations, insomnia, increased anxiety, increased irritability, or decreased appetite.  Instructed patient to contact office if experiencing any significant tolerability issues.  Counseled patient regarding potential benefits, risks, and side effects of Lamictal to include potential risk of Stevens-Johnson syndrome. Advised patient to stop taking Lamictal and contact office immediately if rash  develops and to seek urgent medical attention if rash is severe and/or spreading quickly  Diagnoses and all orders for this visit:  Generalized anxiety disorder -     L-Methylfolate 15 MG TABS; Take 1 tablet (15 mg total) by mouth daily.  Attention deficit hyperactivity disorder (ADHD), unspecified ADHD type -     amphetamine-dextroamphetamine (ADDERALL XR) 10 MG 24 hr capsule; Take 1 capsule (10 mg total) by mouth daily. -     amphetamine-dextroamphetamine (ADDERALL) 5 MG tablet; Take 1 tablet (5 mg total) by mouth 2 (two) times daily.  Insomnia, unspecified type -     L-Methylfolate 15 MG TABS; Take 1 tablet (15 mg total) by mouth daily.  Panic attacks  Episodic mood disorder (HCC) -     L-Methylfolate 15 MG TABS; Take 1 tablet (15 mg total) by mouth daily.  PTSD (post-traumatic stress disorder)    Please see After Visit  Summary for patient specific instructions.  No future appointments.  No orders of the defined types were placed in this encounter.     -------------------------------

## 2019-05-15 DIAGNOSIS — Z0289 Encounter for other administrative examinations: Secondary | ICD-10-CM

## 2019-05-22 ENCOUNTER — Encounter: Payer: Self-pay | Admitting: Adult Health

## 2019-05-22 ENCOUNTER — Ambulatory Visit (INDEPENDENT_AMBULATORY_CARE_PROVIDER_SITE_OTHER): Payer: No Typology Code available for payment source | Admitting: Adult Health

## 2019-05-22 DIAGNOSIS — F411 Generalized anxiety disorder: Secondary | ICD-10-CM

## 2019-05-22 DIAGNOSIS — F39 Unspecified mood [affective] disorder: Secondary | ICD-10-CM

## 2019-05-22 DIAGNOSIS — F909 Attention-deficit hyperactivity disorder, unspecified type: Secondary | ICD-10-CM

## 2019-05-22 DIAGNOSIS — F410 Panic disorder [episodic paroxysmal anxiety] without agoraphobia: Secondary | ICD-10-CM

## 2019-05-22 DIAGNOSIS — F4310 Post-traumatic stress disorder, unspecified: Secondary | ICD-10-CM

## 2019-05-22 DIAGNOSIS — G4700 Insomnia, unspecified: Secondary | ICD-10-CM

## 2019-05-22 MED ORDER — AMPHETAMINE-DEXTROAMPHETAMINE 5 MG PO TABS: 5.0000 mg | ORAL_TABLET | Freq: Two times a day (BID) | ORAL | 0 refills | Status: DC

## 2019-05-22 MED ORDER — AMPHETAMINE-DEXTROAMPHET ER 10 MG PO CP24: 10.0000 mg | ORAL_CAPSULE | Freq: Every day | ORAL | 0 refills | Status: DC

## 2019-05-22 NOTE — Progress Notes (Signed)
Abigail White [id number] [birth year] 39 y.o.  Virtual Visit via Telephone Note  I connected with pt on 05/22/19 at 11:20 AM EST by telephone and verified that I am speaking with the correct person using two identifiers.   I discussed the limitations, risks, security and privacy concerns of performing an evaluation and management service by telephone and the availability of in person appointments. I also discussed with the patient that there may be a patient responsible charge related to this service. The patient expressed understanding and agreed to proceed.   I discussed the assessment and treatment plan with the patient. The patient was provided an opportunity to ask questions and all were answered. The patient agreed with the plan and demonstrated an understanding of the instructions.   The patient was advised to call back or seek an in-person evaluation if the symptoms worsen or if the condition fails to improve as anticipated.  I provided 30 minutes of non-face-to-face time during this encounter.  The patient was located at home.  The provider was located at [facility].   Dorothyann Gibbs, NP   Subjective:   Patient ID:  Abigail White is a 39 y.o. (DOB 01-04-1981) female.  Chief Complaint:  Chief Complaint  Patient presents with  . Insomnia  . Anxiety  . Depression  . Panic Attack  . Trauma  . ADHD    HPI KASHAWN MANZANO presents for follow-up of GAD, MDD, ADHD, insomnia, panic attacks, PTSD.  Describes mood today as "not good". Tearful. Pleasant. Mood symptoms - reports depression, anxiety, and irritability. Increased "panic attacks". Lost brother last week from a Heroin overdose. Lost nephew last October to a heroin overdose. Full of "grief" Having nightmares. Her grandmother found him. Mother having health issues. Mother is a Chartered loss adjuster - has psychotic episodes. Son not doing well in school. Does not feel like she is able to return to work at this time.  varying interest and motivation. Taking medications as prescribed.  Energy levels remain low. Active, does not have a regular exercise routine. Out of work on STD. Enjoys some usual interests and activities. Married. Lives with husband and 3 children. Was able to enjoy holidays with family. Reconciled with some family members.  Appetite adequate. Weight stable 155.  Sleeping difficulties worse since losing brother. Averages 4 to 5 hours - "more or less". Has used Xanax a "few times" since losing brother. Focus and concentration stable. Completing tasks. Managing some aspects of household. Unable to work. Denies SI or HI. Denies AH or VH. Seeing a therapist - Isiah Collins  Review of Systems:  Review of Systems  Musculoskeletal: Negative for gait problem.  Neurological: Negative for tremors.  Psychiatric/Behavioral:       Please refer to HPI    Medications: I have reviewed the patient's current medications.  Current Outpatient Medications  Medication Sig Dispense Refill  . albuterol (PROAIR HFA) 108 (90 Base) MCG/ACT inhaler Inhale 2 puffs into the lungs every 6 (six) hours as needed. 8.5 Inhaler 0  . ALPRAZolam (XANAX) 0.25 MG tablet Take 1/2 to one tablet daily as needed for anxiety/panic attacks. 30 tablet 2  . amphetamine-dextroamphetamine (ADDERALL XR) 10 MG 24 hr capsule Take 1 capsule (10 mg total) by mouth daily. 30 capsule 0  . amphetamine-dextroamphetamine (ADDERALL) 5 MG tablet Take 1 tablet (5 mg total) by mouth 2 (two) times daily. 60 tablet 0  . FLOVENT HFA 110 MCG/ACT inhaler TAKE 2 PUFFS BY MOUTH TWICE A DAY 12 Inhaler  3  . ibuprofen (ADVIL,MOTRIN) 800 MG tablet Take 1 tablet (800 mg total) by mouth every 8 (eight) hours as needed for cramping. 90 tablet 0  . L-Methylfolate 15 MG TABS Take 1 tablet (15 mg total) by mouth daily. 90 tablet 1  . Levonorgestrel (SKYLA) 13.5 MG IUD by Intrauterine route.    . ondansetron (ZOFRAN) 4 MG tablet Take 1 tablet (4 mg total) by mouth  every 8 (eight) hours as needed for nausea or vomiting. 20 tablet 0  . valACYclovir (VALTREX) 500 MG tablet Take 1 tablet as needed for outbreak     No current facility-administered medications for this visit.    Medication Side Effects: None  Allergies:  Allergies  Allergen Reactions  . Effexor [Venlafaxine]     Multiple somatic issues  . Lexapro [Escitalopram Oxalate]     Mania   . Paxil [Paroxetine Hcl]     Feels detached   . Penicillins Hives    Has patient had a PCN reaction causing immediate rash, facial/tongue/throat swelling, SOB or lightheadedness with hypotension: no Has patient had a PCN reaction causing severe rash involving mucus membranes or skin necrosis: yes Has patient had a PCN reaction that required hospitalization no Has patient had a PCN reaction occurring within the last 10 years: no If all of the above answers are "NO", then may proceed with Cephalosporin use.   . Sulfa Antibiotics   . Vicodin [Hydrocodone-Acetaminophen] Nausea And Vomiting    Projectile vomiting  . Wellbutrin [Bupropion]     Became belligerant  . Zoloft [Sertraline Hcl] Hives    rash  . Differin [Adapalene] Rash  . Lamictal [Lamotrigine] Rash  . Latex Rash    Past Medical History:  Diagnosis Date  . ADHD (attention deficit hyperactivity disorder)   . Allergy   . Anemia   . Anxiety    H/O  . Carpal tunnel syndrome    bilateral  . Chest pain    H/O - no current problems - anxiety related  . Chronic fatigue   . Depression    Gestational  . Dyspnea   . GERD (gastroesophageal reflux disease)   . History of chickenpox   . HSV (herpes simplex virus) infection   . Hx of pyelonephritis   . Migraine   . Ovarian cyst   . Palpitations    H/O - no current problems-anxiety related  . PONV (postoperative nausea and vomiting)   . Postpartum care following vaginal delivery (2/13) 06/21/2016  . PTSD (post-traumatic stress disorder)   . Smoker   . Spontaneous vaginal delivery  06/21/2016   svd x 3  . Vaginal Pap smear, abnormal     Family History  Problem Relation Age of Onset  . Coronary artery disease Mother [id number]  . Hypertension Mother   . Diabetes Mother   . Heart disease Mother   . Kidney Stones Mother   . Asthma Mother   . Kidney disease Mother   . Hyperthyroidism Mother   . COPD Mother   . Heart attack Mother   . Dementia Mother   . Other Father        Shot-violence  . Alcohol abuse Father   . Hypertension Brother   . Kidney Stones Brother   . Hyperlipidemia Brother   . COPD Brother   . Heart attack Brother   . Diabetes Brother   . Hypertension Brother   . Hypertension Maternal Grandmother   . Hyperlipidemia Maternal Grandmother   . Cancer Maternal Grandmother  Skin  . COPD Maternal Grandmother   . Diabetes Maternal Grandmother   . Cancer Maternal Grandfather        Bone  . Cancer Paternal Grandmother   . Stroke Paternal Grandmother   . Aneurysm Paternal Grandfather   . Hypertension Other   . Diabetes Other   . Asthma Son   . Hypertension Maternal Aunt   . COPD Maternal Aunt   . Diabetes Maternal Aunt   . Heart attack Maternal Aunt   . Cancer Paternal Aunt        Breast    Social History   Socioeconomic History  . Marital status: Married    Spouse name: Not on file  . Number of children: 3  . Years of education: 4  . Highest education level: Not on file  Occupational History  . Occupation: nusre     Employer: OTHER  Tobacco Use  . Smoking status: Current Every Day Smoker    Packs/day: 0.50    Years: 20.00    Pack years: 10.00    Types: Cigarettes  . Smokeless tobacco: Never Used  Substance and Sexual Activity  . Alcohol use: Yes  . Drug use: No  . Sexual activity: Yes    Birth control/protection: I.U.D.  Other Topics Concern  . Not on file  Social History Narrative  . Not on file   Social Determinants of Health   Financial Resource Strain:   . Difficulty of Paying Living Expenses: Not on file  Food  Insecurity:   . Worried About Programme researcher, broadcasting/film/video in the Last Year: Not on file  . Ran Out of Food in the Last Year: Not on file  Transportation Needs:   . Lack of Transportation (Medical): Not on file  . Lack of Transportation (Non-Medical): Not on file  Physical Activity:   . Days of Exercise per Week: Not on file  . Minutes of Exercise per Session: Not on file  Stress:   . Feeling of Stress : Not on file  Social Connections:   . Frequency of Communication with Friends and Family: Not on file  . Frequency of Social Gatherings with Friends and Family: Not on file  . Attends Religious Services: Not on file  . Active Member of Clubs or Organizations: Not on file  . Attends Banker Meetings: Not on file  . Marital Status: Not on file  Intimate Partner Violence:   . Fear of Current or Ex-Partner: Not on file  . Emotionally Abused: Not on file  . Physically Abused: Not on file  . Sexually Abused: Not on file    Past Medical History, Surgical history, Social history, and Family history were reviewed and updated as appropriate.   Please see review of systems for further details on the patient's review from today.   Objective:   Physical Exam:  There were no vitals taken for this visit.  Physical Exam Constitutional:      General: She is not in acute distress.    Appearance: She is well-developed.  Musculoskeletal:        General: No deformity.  Neurological:     Mental Status: She is alert and oriented to person, place, and time.     Coordination: Coordination normal.  Psychiatric:        Attention and Perception: Attention and perception normal. She does not perceive auditory or visual hallucinations.        Mood and Affect: Mood is anxious and depressed. Affect is  tearful. Affect is not labile, blunt, angry or inappropriate.        Speech: Speech normal.        Behavior: Behavior normal.        Thought Content: Thought content normal. Thought content is not  paranoid or delusional. Thought content does not include homicidal or suicidal ideation. Thought content does not include homicidal or suicidal plan.        Cognition and Memory: Cognition and memory normal.        Judgment: Judgment normal.     Comments: Insight intact     Lab Review:     Component Value Date/Time   NA 139 12/13/2018 1154   K 3.6 12/13/2018 1154   CL 104 12/13/2018 1154   CO2 26 12/13/2018 1154   GLUCOSE 86 12/13/2018 1154   BUN 8 12/13/2018 1154   CREATININE 0.71 12/13/2018 1154   CREATININE 0.67 11/26/2013 1604   CALCIUM 9.5 12/13/2018 1154   PROT 7.0 12/13/2018 1154   ALBUMIN 4.5 12/13/2018 1154   AST 10 12/13/2018 1154   ALT 8 12/13/2018 1154   ALKPHOS 60 12/13/2018 1154   BILITOT 0.5 12/13/2018 1154   GFRNONAA >60 07/31/2018 1414   GFRAA >60 07/31/2018 1414       Component Value Date/Time   WBC 7.7 12/13/2018 1154   RBC 4.57 12/13/2018 1154   HGB 14.5 12/13/2018 1154   HCT 43.4 12/13/2018 1154   PLT 298.0 12/13/2018 1154   MCV 95.0 12/13/2018 1154   MCV 94.4 07/14/2014 1524   MCH 30.4 07/31/2018 1414   MCHC 33.5 12/13/2018 1154   RDW 12.9 12/13/2018 1154   LYMPHSABS 1.7 12/13/2018 1154   MONOABS 0.5 12/13/2018 1154   EOSABS 0.1 12/13/2018 1154   BASOSABS 0.1 12/13/2018 1154    No results found for: POCLITH, LITHIUM   No results found for: PHENYTOIN, PHENOBARB, VALPROATE, CBMZ   .res Assessment: Plan:    Plan:  1. Continue Xanax 0.25mg  daily PRN anxiety - has been able to tolerate - no history of abuse. 2. Continue Adderall 5mg  BID 3. Continue Adderall XR 10 mg    4. 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   Out of work 05/21/2019 thru 06/18/2019 - will re-assess RTW date at next visit  Multiple failures with SSRI, SNRI, and Wellbutrin - allergic reactions.  Continue therapy  RTC 4  weeks  Patient advised to contact office with any questions, adverse effects, or acute worsening in signs and symptoms.  Discussed potential benefits, risk, and side effects of benzodiazepines to include potential risk of tolerance and dependence, as well as possible drowsiness.  Advised patient not to drive if experiencing drowsiness and to take lowest possible effective dose to minimize risk of dependence and tolerance.  Discussed potential benefits, risks, and side effects of stimulants with patient to include increased heart rate, palpitations, insomnia, increased anxiety, increased irritability, or decreased appetite.  Instructed patient to contact office if experiencing any significant tolerability issues.  Counseled patient regarding potential benefits, risks, and side effects of Lamictal to include potential risk of Stevens-Johnson syndrome. Advised patient to stop taking Lamictal and contact office immediately if rash develops and to seek urgent medical attention if rash is severe and/or spreading quickly   Keatyn was seen today for insomnia, anxiety, depression, panic attack, trauma and adhd.  Diagnoses and all orders for this visit:  Generalized anxiety disorder  Attention deficit hyperactivity disorder (ADHD), unspecified ADHD type -     amphetamine-dextroamphetamine (ADDERALL) 5 MG tablet; Take 1 tablet (5 mg total) by mouth 2 (two) times daily. -     amphetamine-dextroamphetamine (ADDERALL XR) 10 MG 24 hr capsule; Take 1 capsule (10 mg total) by mouth daily.  Insomnia, unspecified type  Panic attacks  PTSD (post-traumatic stress disorder)  Episodic mood disorder (HCC)    Please see After Visit Summary for patient specific instructions.  No future appointments.  No orders of the defined types were placed in this encounter.     -------------------------------

## 2019-07-01 ENCOUNTER — Encounter: Payer: Self-pay | Admitting: Adult Health

## 2019-07-01 ENCOUNTER — Ambulatory Visit (INDEPENDENT_AMBULATORY_CARE_PROVIDER_SITE_OTHER): Payer: 59 | Admitting: Adult Health

## 2019-07-01 DIAGNOSIS — F411 Generalized anxiety disorder: Secondary | ICD-10-CM

## 2019-07-01 DIAGNOSIS — F410 Panic disorder [episodic paroxysmal anxiety] without agoraphobia: Secondary | ICD-10-CM

## 2019-07-01 DIAGNOSIS — G4700 Insomnia, unspecified: Secondary | ICD-10-CM | POA: Diagnosis not present

## 2019-07-01 DIAGNOSIS — F909 Attention-deficit hyperactivity disorder, unspecified type: Secondary | ICD-10-CM

## 2019-07-01 DIAGNOSIS — F4310 Post-traumatic stress disorder, unspecified: Secondary | ICD-10-CM

## 2019-07-01 DIAGNOSIS — F39 Unspecified mood [affective] disorder: Secondary | ICD-10-CM

## 2019-07-01 MED ORDER — L-METHYLFOLATE 15 MG PO TABS: 1.0000 | ORAL_TABLET | Freq: Every day | ORAL | 1 refills | Status: DC

## 2019-07-01 MED ORDER — AMPHETAMINE-DEXTROAMPHETAMINE 5 MG PO TABS: 5.0000 mg | ORAL_TABLET | Freq: Two times a day (BID) | ORAL | 0 refills | Status: DC

## 2019-07-01 MED ORDER — AMPHETAMINE-DEXTROAMPHET ER 10 MG PO CP24: 10.0000 mg | ORAL_CAPSULE | Freq: Every day | ORAL | 0 refills | Status: DC
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
## 2019-08-11 ENCOUNTER — Encounter: Payer: Self-pay | Admitting: Adult Health

## 2019-08-11 ENCOUNTER — Ambulatory Visit (INDEPENDENT_AMBULATORY_CARE_PROVIDER_SITE_OTHER): Payer: 59 | Admitting: Adult Health

## 2019-08-11 DIAGNOSIS — F410 Panic disorder [episodic paroxysmal anxiety] without agoraphobia: Secondary | ICD-10-CM | POA: Diagnosis not present

## 2019-08-11 DIAGNOSIS — F411 Generalized anxiety disorder: Secondary | ICD-10-CM | POA: Diagnosis not present

## 2019-08-11 DIAGNOSIS — F4310 Post-traumatic stress disorder, unspecified: Secondary | ICD-10-CM

## 2019-08-11 DIAGNOSIS — F909 Attention-deficit hyperactivity disorder, unspecified type: Secondary | ICD-10-CM | POA: Diagnosis not present

## 2019-08-11 DIAGNOSIS — G4700 Insomnia, unspecified: Secondary | ICD-10-CM | POA: Diagnosis not present

## 2019-08-11 NOTE — Progress Notes (Signed)
Abigail White [id number] 06-30-1980 39 y.o.  Virtual Visit via Telephone Note  I connected with pt on 08/11/19 at 12:00 PM EDT by telephone and verified that I am speaking with the correct person using two identifiers.   I discussed the limitations, risks, security and privacy concerns of performing an evaluation and management service by telephone and the availability of in person appointments. I also discussed with the patient that there may be a patient responsible charge related to this service. The patient expressed understanding and agreed to proceed.   I discussed the assessment and treatment plan with the patient. The patient was provided an opportunity to ask questions and all were answered. The patient agreed with the plan and demonstrated an understanding of the instructions.   The patient was advised to call back or seek an in-person evaluation if the symptoms worsen or if the condition fails to improve as anticipated.  I provided 30 minutes of non-face-to-face time during this encounter.  The patient was located at home.  The provider was located at [facility].   Dorothyann Gibbs, NP   Subjective:   Patient ID:  Abigail White is a 39 y.o. (DOB 11/10/1980) female.  Chief Complaint: No chief complaint on file.   HPI REX MAGEE presents for follow-up of GAD, MDD, ADHD, insomnia, panic attacks, PTSD.  Describes mood today as "bad". Tearful. Pleasant. Mood symptoms - reports depression, anxiety, and irritability. Reports panic attacks at times. Ongoing situational stressors. First Easter without brother and nephew. Stating "I've cried a lot lately. Mother sick - in nursing home for 2 weeks before they can see her. Husband "relapsed on drugs" for 2 weeks. Not taking Adderall right now - can make my anxiety worse. Attending celebrate recovery. Went to church yesterday. Stating "some days are better than others". Also stating "I am starting to do better than  I used to". Stating "I can't go back to work right now". Stating "I don't want to be on the phone and start crying. Increased axiety. Has an IBS flare for past 2 weeks". Varying interest and motivation. Taking medications as prescribed. Seeing therapist regularly - "beefing up y visits with him". Energy levels vary - "I have burst for a few hours a day". Tries to "get what she can do - done". Active, does not have a regular exercise routine. Out of work on STD.  Enjoys some usual interests and activities. Married. Lives with husband of 2 years and 3 children. Appetite adequate. Weight stable 142 from 145.  Sleeping difficulties. Averages 2 to 7 hours. Stating "my sleep is in cycles".  Focus and concentration "it sucks". Stating "some days I can get stuff done". Trying to accomplish something every day. Youngest child in day care a few days a week. Able to have some "me time". Was able to see dentist. Recent visit with gynecology. Was put on birth control. Feels overwhelmed. Taking a bath and takes care of children. Completing tasks. Managing some aspects of household. Does not feel ready to return to work. Stating "I don't know if I will ever be able to return to work".  Denies SI or HI. Denies AH or VH. Seeing a therapist - Isiah Collins  Review of Systems:  Review of Systems  Musculoskeletal: Negative for gait problem.  Neurological: Negative for tremors.  Psychiatric/Behavioral:       Please refer to HPI    Medications: I have reviewed the patient's current medications.  Current Outpatient Medications  Medication Sig Dispense Refill  . albuterol (PROAIR HFA) 108 (90 Base) MCG/ACT inhaler Inhale 2 puffs into the lungs every 6 (six) hours as needed. 8.5 Inhaler 0  . ALPRAZolam (XANAX) 0.25 MG tablet Take 1/2 to one tablet daily as needed for anxiety/panic attacks. 30 tablet 2  . amphetamine-dextroamphetamine (ADDERALL XR) 10 MG 24 hr capsule Take 1 capsule (10 mg total) by mouth daily. 30  capsule 0  . amphetamine-dextroamphetamine (ADDERALL) 5 MG tablet Take 1 tablet (5 mg total) by mouth 2 (two) times daily. 60 tablet 0  . FLOVENT HFA 110 MCG/ACT inhaler TAKE 2 PUFFS BY MOUTH TWICE A DAY 12 Inhaler 3  . ibuprofen (ADVIL,MOTRIN) 800 MG tablet Take 1 tablet (800 mg total) by mouth every 8 (eight) hours as needed for cramping. 90 tablet 0  . L-Methylfolate 15 MG TABS Take 1 tablet (15 mg total) by mouth daily. 90 tablet 1  . Levonorgestrel (SKYLA) 13.5 MG IUD by Intrauterine route.    . ondansetron (ZOFRAN) 4 MG tablet Take 1 tablet (4 mg total) by mouth every 8 (eight) hours as needed for nausea or vomiting. 20 tablet 0  . valACYclovir (VALTREX) 500 MG tablet Take 1 tablet as needed for outbreak     No current facility-administered medications for this visit.    Medication Side Effects: None  Allergies:  Allergies  Allergen Reactions  . Effexor [Venlafaxine]     Multiple somatic issues  . Lexapro [Escitalopram Oxalate]     Mania   . Paxil [Paroxetine Hcl]     Feels detached   . Penicillins Hives    Has patient had a PCN reaction causing immediate rash, facial/tongue/throat swelling, SOB or lightheadedness with hypotension: no Has patient had a PCN reaction causing severe rash involving mucus membranes or skin necrosis: yes Has patient had a PCN reaction that required hospitalization no Has patient had a PCN reaction occurring within the last 10 years: no If all of the above answers are "NO", then may proceed with Cephalosporin use.   . Sulfa Antibiotics   . Vicodin [Hydrocodone-Acetaminophen] Nausea And Vomiting    Projectile vomiting  . Wellbutrin [Bupropion]     Became belligerant  . Zoloft [Sertraline Hcl] Hives    rash  . Differin [Adapalene] Rash  . Lamictal [Lamotrigine] Rash  . Latex Rash    Past Medical History:  Diagnosis Date  . ADHD (attention deficit hyperactivity disorder)   . Allergy   . Anemia   . Anxiety    H/O  . Carpal tunnel  syndrome    bilateral  . Chest pain    H/O - no current problems - anxiety related  . Chronic fatigue   . Depression    Gestational  . Dyspnea   . GERD (gastroesophageal reflux disease)   . History of chickenpox   . HSV (herpes simplex virus) infection   . Hx of pyelonephritis   . Migraine   . Ovarian cyst   . Palpitations    H/O - no current problems-anxiety related  . PONV (postoperative nausea and vomiting)   . Postpartum care following vaginal delivery (2/13) 06/21/2016  . PTSD (post-traumatic stress disorder)   . Smoker   . Spontaneous vaginal delivery 06/21/2016   svd x 3  . Vaginal Pap smear, abnormal     Family History  Problem Relation Age of Onset  . Coronary artery disease Mother 14  . Hypertension Mother   . Diabetes Mother   . Heart disease Mother   .  Kidney Stones Mother   . Asthma Mother   . Kidney disease Mother   . Hyperthyroidism Mother   . COPD Mother   . Heart attack Mother   . Dementia Mother   . Other Father        Shot-violence  . Alcohol abuse Father   . Hypertension Brother   . Kidney Stones Brother   . Hyperlipidemia Brother   . COPD Brother   . Heart attack Brother   . Diabetes Brother   . Hypertension Brother   . Hypertension Maternal Grandmother   . Hyperlipidemia Maternal Grandmother   . Cancer Maternal Grandmother        Skin  . COPD Maternal Grandmother   . Diabetes Maternal Grandmother   . Cancer Maternal Grandfather        Bone  . Cancer Paternal Grandmother   . Stroke Paternal Grandmother   . Aneurysm Paternal Grandfather   . Hypertension Other   . Diabetes Other   . Asthma Son   . Hypertension Maternal Aunt   . COPD Maternal Aunt   . Diabetes Maternal Aunt   . Heart attack Maternal Aunt   . Cancer Paternal Aunt        Breast    Social History   Socioeconomic History  . Marital status: Married    Spouse name: Not on file  . Number of children: 3  . Years of education: 4  . Highest education level: Not on  file  Occupational History  . Occupation: nusre     Employer: OTHER  Tobacco Use  . Smoking status: Current Every Day Smoker    Packs/day: 0.50    Years: 20.00    Pack years: 10.00    Types: Cigarettes  . Smokeless tobacco: Never Used  Substance and Sexual Activity  . Alcohol use: Yes  . Drug use: No  . Sexual activity: Yes    Birth control/protection: I.U.D.  Other Topics Concern  . Not on file  Social History Narrative  . Not on file   Social Determinants of Health   Financial Resource Strain:   . Difficulty of Paying Living Expenses:   Food Insecurity:   . Worried About Programme researcher, broadcasting/film/videounning Out of Food in the Last Year:   . Baristaan Out of Food in the Last Year:   Transportation Needs:   . Freight forwarderLack of Transportation (Medical):   Marland Kitchen. Lack of Transportation (Non-Medical):   Physical Activity:   . Days of Exercise per Week:   . Minutes of Exercise per Session:   Stress:   . Feeling of Stress :   Social Connections:   . Frequency of Communication with Friends and Family:   . Frequency of Social Gatherings with Friends and Family:   . Attends Religious Services:   . Active Member of Clubs or Organizations:   . Attends BankerClub or Organization Meetings:   Marland Kitchen. Marital Status:   Intimate Partner Violence:   . Fear of Current or Ex-Partner:   . Emotionally Abused:   Marland Kitchen. Physically Abused:   . Sexually Abused:     Past Medical History, Surgical history, Social history, and Family history were reviewed and updated as appropriate.   Please see review of systems for further details on the patient's review from today.   Objective:   Physical Exam:  There were no vitals taken for this visit.  Physical Exam Constitutional:      General: She is not in acute distress. Musculoskeletal:  General: No deformity.  Neurological:     Mental Status: She is alert and oriented to person, place, and time.     Cranial Nerves: No dysarthria.     Coordination: Coordination normal.  Psychiatric:         Attention and Perception: Attention and perception normal. She does not perceive auditory or visual hallucinations.        Mood and Affect: Mood is anxious and depressed. Affect is not labile, blunt, angry or inappropriate.        Speech: Speech normal.        Behavior: Behavior normal. Behavior is cooperative.        Thought Content: Thought content normal. Thought content is not paranoid or delusional. Thought content does not include homicidal or suicidal ideation. Thought content does not include homicidal or suicidal plan.        Cognition and Memory: Cognition and memory normal.        Judgment: Judgment normal.     Comments: Insight intact     Lab Review:     Component Value Date/Time   NA 139 12/13/2018 1154   K 3.6 12/13/2018 1154   CL 104 12/13/2018 1154   CO2 26 12/13/2018 1154   GLUCOSE 86 12/13/2018 1154   BUN 8 12/13/2018 1154   CREATININE 0.71 12/13/2018 1154   CREATININE 0.67 11/26/2013 1604   CALCIUM 9.5 12/13/2018 1154   PROT 7.0 12/13/2018 1154   ALBUMIN 4.5 12/13/2018 1154   AST 10 12/13/2018 1154   ALT 8 12/13/2018 1154   ALKPHOS 60 12/13/2018 1154   BILITOT 0.5 12/13/2018 1154   GFRNONAA >60 07/31/2018 1414   GFRAA >60 07/31/2018 1414       Component Value Date/Time   WBC 7.7 12/13/2018 1154   RBC 4.57 12/13/2018 1154   HGB 14.5 12/13/2018 1154   HCT 43.4 12/13/2018 1154   PLT 298.0 12/13/2018 1154   MCV 95.0 12/13/2018 1154   MCV 94.4 07/14/2014 1524   MCH 30.4 07/31/2018 1414   MCHC 33.5 12/13/2018 1154   RDW 12.9 12/13/2018 1154   LYMPHSABS 1.7 12/13/2018 1154   MONOABS 0.5 12/13/2018 1154   EOSABS 0.1 12/13/2018 1154   BASOSABS 0.1 12/13/2018 1154    No results found for: POCLITH, LITHIUM   No results found for: PHENYTOIN, PHENOBARB, VALPROATE, CBMZ   .res Assessment: Plan:    Plan:  Xanax 0.25mg  daily PRN anxiety - uses occasionally Adderall 5mg  BID Adderall XR 10 mg    L-methylfolate   Working with therapist  Patient does  not want to trial any further medications  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   Out of work 07/30/2019 to 09/08/2019 - will re-assess RTW date at next visit  Multiple failures with SSRI, SNRI, and Wellbutrin - allergic reactions.  Discussed potential benefits, risk, and side effects of benzodiazepines to include potential risk of tolerance and dependence, as well as possible drowsiness.  Advised patient not to drive if experiencing drowsiness and to take lowest possible effective dose to minimize risk of dependence and tolerance.  Discussed potential benefits, risks, and side effects of stimulants with patient to include increased heart rate, palpitations, insomnia, increased anxiety, increased irritability, or decreased appetite.  Instructed patient to contact office if experiencing any significant tolerability issues.  Diagnoses and all orders for this visit:  Panic attacks  Generalized anxiety disorder  Attention deficit hyperactivity disorder (  ADHD), unspecified ADHD type  Insomnia, unspecified type  PTSD (post-traumatic stress disorder)    Please see After Visit Summary for patient specific instructions.  No future appointments.  No orders of the defined types were placed in this encounter.     -------------------------------/

## 2019-09-18 ENCOUNTER — Telehealth: Payer: 59 | Admitting: Adult Health

## 2019-10-22 ENCOUNTER — Telehealth: Payer: Self-pay

## 2019-10-22 NOTE — Telephone Encounter (Signed)
Received paperwork from [facility] for continued FMLA, patient has not been seen since 08/11/2019. Patient needs to schedule apt to be seen by [facility]. She was due back in 4 weeks.

## 2019-10-24 NOTE — Telephone Encounter (Signed)
Noted  

## 2019-10-27 ENCOUNTER — Telehealth (INDEPENDENT_AMBULATORY_CARE_PROVIDER_SITE_OTHER): Payer: No Typology Code available for payment source | Admitting: Adult Health

## 2019-10-27 ENCOUNTER — Encounter: Payer: Self-pay | Admitting: Adult Health

## 2019-10-27 DIAGNOSIS — F410 Panic disorder [episodic paroxysmal anxiety] without agoraphobia: Secondary | ICD-10-CM | POA: Diagnosis not present

## 2019-10-27 DIAGNOSIS — F909 Attention-deficit hyperactivity disorder, unspecified type: Secondary | ICD-10-CM

## 2019-10-27 DIAGNOSIS — G4700 Insomnia, unspecified: Secondary | ICD-10-CM | POA: Diagnosis not present

## 2019-10-27 DIAGNOSIS — F411 Generalized anxiety disorder: Secondary | ICD-10-CM

## 2019-10-27 DIAGNOSIS — F4310 Post-traumatic stress disorder, unspecified: Secondary | ICD-10-CM

## 2019-10-27 MED ORDER — ALPRAZOLAM 0.25 MG PO TABS: ORAL_TABLET | ORAL | 2 refills | Status: DC

## 2019-10-27 NOTE — Progress Notes (Signed)
Abigail White [id number] 07-11-1980 39 y.o.  Virtual Visit via Telephone Note  I connected with pt on 10/27/19 at  1:40 PM EDT by telephone and verified that I am speaking with the correct person using two identifiers.   I discussed the limitations, risks, security and privacy concerns of performing an evaluation and management service by telephone and the availability of in person appointments. I also discussed with the patient that there may be a patient responsible charge related to this service. The patient expressed understanding and agreed to proceed.   I discussed the assessment and treatment plan with the patient. The patient was provided an opportunity to ask questions and all were answered. The patient agreed with the plan and demonstrated an understanding of the instructions.   The patient was advised to call back or seek an in-person evaluation if the symptoms worsen or if the condition fails to improve as anticipated.  I provided 30 minutes of non-face-to-face time during this encounter.  The patient was located at home.  [facility]   Dorothyann Gibbsegina N Eliakim Tendler, NP   Subjective:   Patient ID:  Abigail White is a 39 y.o. (DOB 07-11-1980) female.  Chief Complaint: No chief complaint on file.   HPI Abigail White presents for follow-up of GAD, MDD, ADHD, insomnia, panic attacks, PTSD.  Describes mood today as "bad". Tearful. Pleasant. Mood symptoms - reports depression, anxiety, and irritability. Reports panic attacks. Having "good and bad days". Doing "ok" today. Stating "today is a decent day". Stating "I'm doing everything I can to get out of this hole". Doing some in person visits with therapists. Her husband has left and come back several times since last visit - "still going on binges". Recent trip to the beach with children. Worried about son - needing P/T - flat footed. Both of sons have started therapy. Smoking 1 to 2 packs of  cigarettes a day - has gotten worse. Attending church - reading bible. Has a lot of church "support". Continues to grieve loss of family members. Not talking to mother - "she spirals me". Does not feel ready to return to work. Therapist feels she may be able to return to work on January 07, 2020. Varying interest and motivation. Taking medications as prescribed.  Energy levels vary. Active, does not have a regular exercise routine. Out of work on STD.  Enjoys some usual interests and activities. Married. Lives with husband of 2 years and 3 childen 39 y/o son, 39 year old son, and 323 y/o daughter. Has signed up sons for soccer.  Appetite adequate. Weight stable 145 to 150. Some days not eating and others can eat.   Ongoing sleeping difficulties. Averages 4 to 5 hours. Having nightmares.  Focus and concentration "some days better than others". Completing tasks. Managing some aspects of household.  Denies SI or HI. Denies AH or VH. Seeing a therapist - [facility].  Review of Systems:  Review of Systems  Musculoskeletal: Negative for gait problem.  Neurological: Negative for tremors.  Psychiatric/Behavioral:       Please refer to HPI    Medications: I have reviewed the patient's current medications.  Current Outpatient Medications  Medication Sig Dispense Refill  . albuterol (PROAIR HFA) 108 (90 Base) MCG/ACT inhaler Inhale 2 puffs into the lungs every 6 (six) hours as needed. 8.5 Inhaler 0  . ALPRAZolam (XANAX) 0.25 MG tablet Take 1/2 to one tablet daily as needed for anxiety/panic attacks. 30 tablet  2  . amphetamine-dextroamphetamine (ADDERALL XR) 10 MG 24 hr capsule Take 1 capsule (10 mg total) by mouth daily. 30 capsule 0  . amphetamine-dextroamphetamine (ADDERALL) 5 MG tablet Take 1 tablet (5 mg total) by mouth 2 (two) times daily. 60 tablet 0  . FLOVENT HFA 110 MCG/ACT inhaler TAKE 2 PUFFS BY MOUTH TWICE A DAY 12 Inhaler 3  . ibuprofen (ADVIL,MOTRIN) 800 MG tablet Take 1 tablet (800 mg  total) by mouth every 8 (eight) hours as needed for cramping. 90 tablet 0  . L-Methylfolate 15 MG TABS Take 1 tablet (15 mg total) by mouth daily. 90 tablet 1  . Levonorgestrel (SKYLA) 13.5 MG IUD by Intrauterine route.    . ondansetron (ZOFRAN) 4 MG tablet Take 1 tablet (4 mg total) by mouth every 8 (eight) hours as needed for nausea or vomiting. 20 tablet 0  . valACYclovir (VALTREX) 500 MG tablet Take 1 tablet as needed for outbreak    . XULANE 150-35 MCG/24HR transdermal patch 1 patch once a week.     No current facility-administered medications for this visit.    Medication Side Effects: None  Allergies:  Allergies  Allergen Reactions  . Effexor [Venlafaxine]     Multiple somatic issues  . Lexapro [Escitalopram Oxalate]     Mania   . Paxil [Paroxetine Hcl]     Feels detached   . Penicillins Hives    Has patient had a PCN reaction causing immediate rash, facial/tongue/throat swelling, SOB or lightheadedness with hypotension: no Has patient had a PCN reaction causing severe rash involving mucus membranes or skin necrosis: yes Has patient had a PCN reaction that required hospitalization no Has patient had a PCN reaction occurring within the last 10 years: no If all of the above answers are "NO", then may proceed with Cephalosporin use.   . Sulfa Antibiotics   . Vicodin [Hydrocodone-Acetaminophen] Nausea And Vomiting    Projectile vomiting  . Wellbutrin [Bupropion]     Became belligerant  . Zoloft [Sertraline Hcl] Hives    rash  . Differin [Adapalene] Rash  . Lamictal [Lamotrigine] Rash  . Latex Rash    Past Medical History:  Diagnosis Date  . ADHD (attention deficit hyperactivity disorder)   . Allergy   . Anemia   . Anxiety    H/O  . Carpal tunnel syndrome    bilateral  . Chest pain    H/O - no current problems - anxiety related  . Chronic fatigue   . Depression    Gestational  . Dyspnea   . GERD (gastroesophageal reflux disease)   . History of chickenpox    . HSV (herpes simplex virus) infection   . Hx of pyelonephritis   . Migraine   . Ovarian cyst   . Palpitations    H/O - no current problems-anxiety related  . PONV (postoperative nausea and vomiting)   . Postpartum care following vaginal delivery (2/13) 06/21/2016  . PTSD (post-traumatic stress disorder)   . Smoker   . Spontaneous vaginal delivery 06/21/2016   svd x 3  . Vaginal Pap smear, abnormal     Family History  Problem Relation Age of Onset  . Coronary artery disease Mother 75  . Hypertension Mother   . Diabetes Mother   . Heart disease Mother   . Kidney Stones Mother   . Asthma Mother   . Kidney disease Mother   . Hyperthyroidism Mother   . COPD Mother   . Heart attack Mother   .  Dementia Mother   . Other Father        Shot-violence  . Alcohol abuse Father   . Hypertension Brother   . Kidney Stones Brother   . Hyperlipidemia Brother   . COPD Brother   . Heart attack Brother   . Diabetes Brother   . Hypertension Brother   . Hypertension Maternal Grandmother   . Hyperlipidemia Maternal Grandmother   . Cancer Maternal Grandmother        Skin  . COPD Maternal Grandmother   . Diabetes Maternal Grandmother   . Cancer Maternal Grandfather        Bone  . Cancer Paternal Grandmother   . Stroke Paternal Grandmother   . Aneurysm Paternal Grandfather   . Hypertension Other   . Diabetes Other   . Asthma Son   . Hypertension Maternal Aunt   . COPD Maternal Aunt   . Diabetes Maternal Aunt   . Heart attack Maternal Aunt   . Cancer Paternal Aunt        Breast    Social History   Socioeconomic History  . Marital status: Married    Spouse name: Not on file  . Number of children: 3  . Years of education: 4  . Highest education level: Not on file  Occupational History  . Occupation: nusre     Employer: OTHER  Tobacco Use  . Smoking status: Current Every Day Smoker    Packs/day: 0.50    Years: 20.00    Pack years: 10.00    Types: Cigarettes  . Smokeless  tobacco: Never Used  Vaping Use  . Vaping Use: Every day  Substance and Sexual Activity  . Alcohol use: Yes  . Drug use: No  . Sexual activity: Yes    Birth control/protection: I.U.D.  Other Topics Concern  . Not on file  Social History Narrative  . Not on file   Social Determinants of Health   Financial Resource Strain:   . Difficulty of Paying Living Expenses:   Food Insecurity:   . Worried About Programme researcher, broadcasting/film/video in the Last Year:   . Barista in the Last Year:   Transportation Needs:   . Freight forwarder (Medical):   Marland Kitchen Lack of Transportation (Non-Medical):   Physical Activity:   . Days of Exercise per Week:   . Minutes of Exercise per Session:   Stress:   . Feeling of Stress :   Social Connections:   . Frequency of Communication with Friends and Family:   . Frequency of Social Gatherings with Friends and Family:   . Attends Religious Services:   . Active Member of Clubs or Organizations:   . Attends Banker Meetings:   Marland Kitchen Marital Status:   Intimate Partner Violence:   . Fear of Current or Ex-Partner:   . Emotionally Abused:   Marland Kitchen Physically Abused:   . Sexually Abused:     Past Medical History, Surgical history, Social history, and Family history were reviewed and updated as appropriate.   Please see review of systems for further details on the patient's review from today.   Objective:   Physical Exam:  There were no vitals taken for this visit.  Physical Exam Constitutional:      General: She is not in acute distress. Musculoskeletal:        General: No deformity.  Neurological:     Mental Status: She is alert and oriented to person, place, and time.  Coordination: Coordination normal.  Psychiatric:        Attention and Perception: Attention and perception normal. She does not perceive auditory or visual hallucinations.        Mood and Affect: Mood normal. Mood is not anxious or depressed. Affect is not labile, blunt, angry  or inappropriate.        Speech: Speech normal.        Behavior: Behavior normal.        Thought Content: Thought content normal. Thought content is not paranoid or delusional. Thought content does not include homicidal or suicidal ideation. Thought content does not include homicidal or suicidal plan.        Cognition and Memory: Cognition and memory normal.        Judgment: Judgment normal.     Comments: Insight intact     Lab Review:     Component Value Date/Time   NA 139 12/13/2018 1154   K 3.6 12/13/2018 1154   CL 104 12/13/2018 1154   CO2 26 12/13/2018 1154   GLUCOSE 86 12/13/2018 1154   BUN 8 12/13/2018 1154   CREATININE 0.71 12/13/2018 1154   CREATININE 0.67 11/26/2013 1604   CALCIUM 9.5 12/13/2018 1154   PROT 7.0 12/13/2018 1154   ALBUMIN 4.5 12/13/2018 1154   AST 10 12/13/2018 1154   ALT 8 12/13/2018 1154   ALKPHOS 60 12/13/2018 1154   BILITOT 0.5 12/13/2018 1154   GFRNONAA >60 07/31/2018 1414   GFRAA >60 07/31/2018 1414       Component Value Date/Time   WBC 7.7 12/13/2018 1154   RBC 4.57 12/13/2018 1154   HGB 14.5 12/13/2018 1154   HCT 43.4 12/13/2018 1154   PLT 298.0 12/13/2018 1154   MCV 95.0 12/13/2018 1154   MCV 94.4 07/14/2014 1524   MCH 30.4 07/31/2018 1414   MCHC 33.5 12/13/2018 1154   RDW 12.9 12/13/2018 1154   LYMPHSABS 1.7 12/13/2018 1154   MONOABS 0.5 12/13/2018 1154   EOSABS 0.1 12/13/2018 1154   BASOSABS 0.1 12/13/2018 1154    No results found for: POCLITH, LITHIUM   No results found for: PHENYTOIN, PHENOBARB, VALPROATE, CBMZ   .res Assessment: Plan:    Plan:  Xanax 0.25mg  daily PRN anxiety - uses occasionally Adderall 5mg  BID Adderall XR 10 mg    L-methylfolate   Working with therapist - Danelle Berry. Therapist feels she may be return to work on September 1st.   Patient does not want to trial any further medications.  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   Out  of work 09/08/2019 to 07/19//2021 - will re-assess RTW date at next visit.  Multiple failures with SSRI, SNRI, and Wellbutrin - allergic reactions.  Discussed potential benefits, risk, and side effects of benzodiazepines to include potential risk of tolerance and dependence, as well as possible drowsiness.  Advised patient not to drive if experiencing drowsiness and to take lowest possible effective dose to minimize risk of dependence and tolerance.  Discussed potential benefits, risks, and side effects of stimulants with patient to include increased heart rate, palpitations, insomnia, increased anxiety, increased irritability, or decreased appetite.  Instructed patient to contact office if experiencing any significant tolerability issues.  Diagnoses and all orders for this visit:  Panic attacks -     ALPRAZolam (XANAX) 0.25 MG tablet; Take 1/2 to one tablet daily as needed for anxiety/panic attacks.  Generalized anxiety disorder  Attention  deficit hyperactivity disorder (ADHD), unspecified ADHD type -     ALPRAZolam (XANAX) 0.25 MG tablet; Take 1/2 to one tablet daily as needed for anxiety/panic attacks.  Insomnia, unspecified type -     ALPRAZolam (XANAX) 0.25 MG tablet; Take 1/2 to one tablet daily as needed for anxiety/panic attacks.  PTSD (post-traumatic stress disorder)    Please see After Visit Summary for patient specific instructions.  No future appointments.  No orders of the defined types were placed in this encounter.     -------------------------------

## 2019-10-28 NOTE — Telephone Encounter (Signed)
Noted  

## 2019-12-02 ENCOUNTER — Encounter: Payer: Self-pay | Admitting: Adult Health

## 2019-12-02 ENCOUNTER — Telehealth (INDEPENDENT_AMBULATORY_CARE_PROVIDER_SITE_OTHER): Payer: No Typology Code available for payment source | Admitting: Adult Health

## 2019-12-02 DIAGNOSIS — F909 Attention-deficit hyperactivity disorder, unspecified type: Secondary | ICD-10-CM

## 2019-12-02 DIAGNOSIS — F39 Unspecified mood [affective] disorder: Secondary | ICD-10-CM | POA: Diagnosis not present

## 2019-12-02 DIAGNOSIS — F410 Panic disorder [episodic paroxysmal anxiety] without agoraphobia: Secondary | ICD-10-CM

## 2019-12-02 DIAGNOSIS — F4310 Post-traumatic stress disorder, unspecified: Secondary | ICD-10-CM | POA: Diagnosis not present

## 2019-12-02 DIAGNOSIS — F411 Generalized anxiety disorder: Secondary | ICD-10-CM

## 2019-12-02 DIAGNOSIS — G4700 Insomnia, unspecified: Secondary | ICD-10-CM

## 2019-12-02 NOTE — Progress Notes (Signed)
No show for appt. 

## 2019-12-04 ENCOUNTER — Telehealth (INDEPENDENT_AMBULATORY_CARE_PROVIDER_SITE_OTHER): Payer: No Typology Code available for payment source | Admitting: Adult Health

## 2019-12-04 DIAGNOSIS — F4310 Post-traumatic stress disorder, unspecified: Secondary | ICD-10-CM

## 2019-12-04 DIAGNOSIS — F39 Unspecified mood [affective] disorder: Secondary | ICD-10-CM

## 2019-12-04 DIAGNOSIS — F909 Attention-deficit hyperactivity disorder, unspecified type: Secondary | ICD-10-CM

## 2019-12-04 DIAGNOSIS — F411 Generalized anxiety disorder: Secondary | ICD-10-CM

## 2019-12-04 DIAGNOSIS — G4700 Insomnia, unspecified: Secondary | ICD-10-CM

## 2019-12-04 DIAGNOSIS — F410 Panic disorder [episodic paroxysmal anxiety] without agoraphobia: Secondary | ICD-10-CM

## 2019-12-04 NOTE — Progress Notes (Signed)
Abigail White show for appt. No show for appt.  [id number] [birth year] 39 y.o.  Subjective:   Patient ID:  Abigail White is a 39 y.o. (DOB 1980-08-28) female.  Chief Complaint: No chief complaint on file.   HPI Abigail White presents to the office today for follow-up of GAD, MDD, ADHD, insomnia, panic attacks, PTSD.  Describes mood today as "bad". Tearful throughout interview. Pleasant. Mood symptoms - reports depression, anxiety, and irritability. Reports panic attacks. Stating "I'm still not feeling better". Tries to start tasks and has to lay down after 20 minutes. Has days that are good and productive, then has days where she can't get out of bed. Some days don't take a bath. Pours the energy she has into her children. Children all in therapy. She sees a therapist virtually. Taking several hours to get her head straight to do anything. Stating "I'm in a mental struggle up until lunch time before I can try to pull out of it. Husband has been gone for 3 weeks. Continues to relapse and will not get help. Trying to set up boundaries, but then he threatens to take her to court. He has not helped around the house - nephew had to mow the yard. Reading bible. Reformer'a unanimous - support group.Attending church regularly. Continues to grieve the loss of family members. Stating "I'm still not ready to return to work". Varying interest and motivation. Taking medications as prescribed.  Energy levels vary. Active, does not have a regular exercise routine. Out of work on STD.  Enjoys some usual interests and activities. Married. Lives with 3 childen 28 y/o son, 66 year old son, and 52 y/o daughter. Sons attending church camp next week.  Appetite adequate. Weight stable 145 to 150. Some days not eating and others can eat.   Ongoing sleeping difficulties. Averages 4 to 5 hours. Having nightmares.  Focus and concentration "some days better than others". Completing tasks. Managing some aspects of  household.  Denies SI or HI. Denies AH or VH. Seeing a therapist - Abigail White.   GAD-7     Office Visit from 08/28/2017 in [facility] Office Visit from 11/03/2016 in [facility]  Total GAD-7 Score 18 14    PHQ2-9     Office Visit from 12/13/2018 in [facility] Office Visit from 08/28/2017 in [facility] Office Visit from 07/24/2017 in [facility] Office Visit from 11/03/2016 in [facility] Office Visit from 07/25/2016 in [facility]  PHQ-2 Total Score 3 4 2  0 0  PHQ-9 Total Score 12 12 3  0 6       Review of Systems:  Review of Systems  Musculoskeletal: Negative for gait problem.  Neurological: Negative for tremors.  Psychiatric/Behavioral:       Please refer to HPI    Medications: I have reviewed the patient's current medications.  Current Outpatient Medications  Medication Sig Dispense Refill  . albuterol (PROAIR HFA) 108 (90 Base) MCG/ACT inhaler Inhale 2 puffs into the lungs every 6 (six) hours as needed. 8.5 Inhaler 0  . ALPRAZolam (XANAX) 0.25 MG tablet Take 1/2 to one tablet daily as needed for anxiety/panic attacks. 30 tablet 2  . amphetamine-dextroamphetamine (ADDERALL XR) 10 MG 24 hr capsule Take 1 capsule (10 mg total) by mouth daily. 30 capsule 0  . amphetamine-dextroamphetamine (ADDERALL) 5 MG tablet Take 1 tablet (5 mg total) by  mouth 2 (two) times daily. 60 tablet 0  . FLOVENT HFA 110 MCG/ACT inhaler TAKE 2 PUFFS BY MOUTH TWICE A DAY 12 Inhaler 3  . ibuprofen (ADVIL,MOTRIN) 800 MG tablet Take 1 tablet (800 mg total) by mouth every 8 (eight) hours as needed for cramping. 90 tablet 0  . L-Methylfolate 15 MG TABS Take 1 tablet (15 mg total) by mouth daily. 90 tablet 1  . Levonorgestrel (SKYLA) 13.5  MG IUD by Intrauterine route.    . ondansetron (ZOFRAN) 4 MG tablet Take 1 tablet (4 mg total) by mouth every 8 (eight) hours as needed for nausea or vomiting. 20 tablet 0  . valACYclovir (VALTREX) 500 MG tablet Take 1 tablet as needed for outbreak    . XULANE 150-35 MCG/24HR transdermal patch 1 patch once a week.     No current facility-administered medications for this visit.    Medication Side Effects: None  Allergies:  Allergies  Allergen Reactions  . Effexor [Venlafaxine]     Multiple somatic issues  . Lexapro [Escitalopram Oxalate]     Mania   . Paxil [Paroxetine Hcl]     Feels detached   . Penicillins Hives    Has patient had a PCN reaction causing immediate rash, facial/tongue/throat swelling, SOB or lightheadedness with hypotension: no Has patient had a PCN reaction causing severe rash involving mucus membranes or skin necrosis: yes Has patient had a PCN reaction that required hospitalization no Has patient had a PCN reaction occurring within the last 10 years: no If all of the above answers are "NO", then may proceed with Cephalosporin use.   . Sulfa Antibiotics   . Vicodin [Hydrocodone-Acetaminophen] Nausea And Vomiting    Projectile vomiting  . Wellbutrin [Bupropion]     Became belligerant  . Zoloft [Sertraline Hcl] Hives    rash  . Differin [Adapalene] Rash  . Lamictal [Lamotrigine] Rash  . Latex Rash    Past Medical History:  Diagnosis Date  . ADHD (attention deficit hyperactivity disorder)   . Allergy   . Anemia   . Anxiety    H/O  . Carpal tunnel syndrome    bilateral  . Chest pain    H/O - no current problems - anxiety related  . Chronic fatigue   . Depression    Gestational  . Dyspnea   . GERD (gastroesophageal reflux disease)   . History of chickenpox   . HSV (herpes simplex virus) infection   . Hx of pyelonephritis   . Migraine   . Ovarian cyst   . Palpitations    H/O - no current problems-anxiety related  . PONV (postoperative nausea  and vomiting)   . Postpartum care following vaginal delivery (2/13) 06/21/2016  . PTSD (post-traumatic stress disorder)   . Smoker   . Spontaneous vaginal delivery 06/21/2016   svd x 3  . Vaginal Pap smear, abnormal     Family History  Problem Relation Age of Onset  . Coronary artery disease Mother 2655  . Hypertension Mother   . Diabetes Mother   . Heart disease Mother   . Kidney Stones Mother   . Asthma Mother   . Kidney disease Mother   . Hyperthyroidism Mother   . COPD Mother   . Heart attack Mother   . Dementia Mother   . Other Father        Shot-violence  . Alcohol abuse Father   . Hypertension Brother   . Kidney Stones Brother   . Hyperlipidemia Brother   .  COPD Brother   . Heart attack Brother   . Diabetes Brother   . Hypertension Brother   . Hypertension Maternal Grandmother   . Hyperlipidemia Maternal Grandmother   . Cancer Maternal Grandmother        Skin  . COPD Maternal Grandmother   . Diabetes Maternal Grandmother   . Cancer Maternal Grandfather        Bone  . Cancer Paternal Grandmother   . Stroke Paternal Grandmother   . Aneurysm Paternal Grandfather   . Hypertension Other   . Diabetes Other   . Asthma Son   . Hypertension Maternal Aunt   . COPD Maternal Aunt   . Diabetes Maternal Aunt   . Heart attack Maternal Aunt   . Cancer Paternal Aunt        Breast    Social History   Socioeconomic History  . Marital status: Married    Spouse name: Not on file  . Number of children: 3  . Years of education: 4  . Highest education level: Not on file  Occupational History  . Occupation: nusre     Employer: OTHER  Tobacco Use  . Smoking status: Current Every Day Smoker    Packs/day: 0.50    Years: 20.00    Pack years: 10.00    Types: Cigarettes  . Smokeless tobacco: Never Used  Vaping Use  . Vaping Use: Every day  Substance and Sexual Activity  . Alcohol use: Yes  . Drug use: No  . Sexual activity: Yes    Birth control/protection: I.U.D.   Other Topics Concern  . Not on file  Social History Narrative  . Not on file   Social Determinants of Health   Financial Resource Strain:   . Difficulty of Paying Living Expenses:   Food Insecurity:   . Worried About Programme researcher, broadcasting/film/video in the Last Year:   . Barista in the Last Year:   Transportation Needs:   . Freight forwarder (Medical):   Marland Kitchen Lack of Transportation (Non-Medical):   Physical Activity:   . Days of Exercise per Week:   . Minutes of Exercise per Session:   Stress:   . Feeling of Stress :   Social Connections:   . Frequency of Communication with Friends and Family:   . Frequency of Social Gatherings with Friends and Family:   . Attends Religious Services:   . Active Member of Clubs or Organizations:   . Attends Banker Meetings:   Marland Kitchen Marital Status:   Intimate Partner Violence:   . Fear of Current or Ex-Partner:   . Emotionally Abused:   Marland Kitchen Physically Abused:   . Sexually Abused:     Past Medical History, Surgical history, Social history, and Family history were reviewed and updated as appropriate.   Please see review of systems for further details on the patient's review from today.   Objective:   Physical Exam:  There were no vitals taken for this visit.  Physical Exam Constitutional:      General: She is not in acute distress. Musculoskeletal:        General: No deformity.  Neurological:     Mental Status: She is alert and oriented to person, place, and time.     Coordination: Coordination normal.  Psychiatric:        Attention and Perception: Attention and perception normal. She does not perceive auditory or visual hallucinations.        Mood and  Affect: Mood is anxious and depressed. Affect is not labile, blunt, angry or inappropriate.        Speech: Speech normal.        Behavior: Behavior normal.        Thought Content: Thought content normal. Thought content is not paranoid or delusional. Thought content does not  include homicidal or suicidal ideation. Thought content does not include homicidal or suicidal plan.        Cognition and Memory: Cognition and memory normal.        Judgment: Judgment normal.     Comments: Insight intact     Lab Review:     Component Value Date/Time   NA 139 12/13/2018 1154   K 3.6 12/13/2018 1154   CL 104 12/13/2018 1154   CO2 26 12/13/2018 1154   GLUCOSE 86 12/13/2018 1154   BUN 8 12/13/2018 1154   CREATININE 0.71 12/13/2018 1154   CREATININE 0.67 11/26/2013 1604   CALCIUM 9.5 12/13/2018 1154   PROT 7.0 12/13/2018 1154   ALBUMIN 4.5 12/13/2018 1154   AST 10 12/13/2018 1154   ALT 8 12/13/2018 1154   ALKPHOS 60 12/13/2018 1154   BILITOT 0.5 12/13/2018 1154   GFRNONAA >60 07/31/2018 1414   GFRAA >60 07/31/2018 1414       Component Value Date/Time   WBC 7.7 12/13/2018 1154   RBC 4.57 12/13/2018 1154   HGB 14.5 12/13/2018 1154   HCT 43.4 12/13/2018 1154   PLT 298.0 12/13/2018 1154   MCV 95.0 12/13/2018 1154   MCV 94.4 07/14/2014 1524   MCH 30.4 07/31/2018 1414   MCHC 33.5 12/13/2018 1154   RDW 12.9 12/13/2018 1154   LYMPHSABS 1.7 12/13/2018 1154   MONOABS 0.5 12/13/2018 1154   EOSABS 0.1 12/13/2018 1154   BASOSABS 0.1 12/13/2018 1154    No results found for: POCLITH, LITHIUM   No results found for: PHENYTOIN, PHENOBARB, VALPROATE, CBMZ   .res Assessment: Plan:    Plan:  Xanax 0.25mg  daily PRN anxiety - uses occasionally Adderall 5mg  BID - not taking Adderall XR 10 mg - not taking   L-methylfolate   Working with therapist - .   Patient does not want to trial any further medications.  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   Out of work 11/24/2019 to 01/07/2020 - will re-assess RTW date at next visit.  Multiple failures with SSRI, SNRI, and Wellbutrin - allergic reactions.  Discussed potential benefits, risk, and side effects of benzodiazepines to include  potential risk of tolerance and dependence, as well as possible drowsiness.  Advised patient not to drive if experiencing drowsiness and to take lowest possible effective dose to minimize risk of dependence and tolerance.  Discussed potential benefits, risks, and side effects of stimulants with patient to include increased heart rate, palpitations, insomnia, increased anxiety, increased irritability, or decreased appetite.  Instructed patient to contact office if experiencing any significant tolerability issues.   Diagnoses and all orders for this visit:  Episodic mood disorder (HCC)  PTSD (post-traumatic stress disorder)  Insomnia, unspecified type  Attention deficit hyperactivity disorder (ADHD), unspecified ADHD type  Generalized anxiety disorder  Panic attacks     Please see After Visit Summary for patient specific instructions.  No future appointments.  No orders of the defined types were placed in this encounter.   -------------------------------

## 2020-01-05 ENCOUNTER — Telehealth: Payer: No Typology Code available for payment source | Admitting: Adult Health

## 2020-01-15 ENCOUNTER — Telehealth (INDEPENDENT_AMBULATORY_CARE_PROVIDER_SITE_OTHER): Payer: 59 | Admitting: Adult Health

## 2020-01-15 ENCOUNTER — Encounter: Payer: Self-pay | Admitting: Adult Health

## 2020-01-15 DIAGNOSIS — F4310 Post-traumatic stress disorder, unspecified: Secondary | ICD-10-CM | POA: Diagnosis not present

## 2020-01-15 DIAGNOSIS — F411 Generalized anxiety disorder: Secondary | ICD-10-CM | POA: Diagnosis not present

## 2020-01-15 DIAGNOSIS — F909 Attention-deficit hyperactivity disorder, unspecified type: Secondary | ICD-10-CM | POA: Diagnosis not present

## 2020-01-15 DIAGNOSIS — F410 Panic disorder [episodic paroxysmal anxiety] without agoraphobia: Secondary | ICD-10-CM

## 2020-01-15 DIAGNOSIS — G4700 Insomnia, unspecified: Secondary | ICD-10-CM

## 2020-01-15 NOTE — Progress Notes (Signed)
Abigail White [id number] 1981-04-21 39 y.o.  Virtual Visit via Telephone Note  I connected with pt on 01/15/20 at  9:40 AM EDT by telephone and verified that I am speaking with the correct person using two identifiers.   I discussed the limitations, risks, security and privacy concerns of performing an evaluation and management service by telephone and the availability of in person appointments. I also discussed with the patient that there may be a patient responsible charge related to this service. The patient expressed understanding and agreed to proceed.   I discussed the assessment and treatment plan with the patient. The patient was provided an opportunity to ask questions and all were answered. The patient agreed with the plan and demonstrated an understanding of the instructions.   The patient was advised to call back or seek an in-person evaluation if the symptoms worsen or if the condition fails to improve as anticipated.  I provided 30 minutes of non-face-to-face time during this encounter.  The patient was located at home.  [facility]   Dorothyann Gibbsegina N Amiracle Neises, NP   Subjective:   Patient ID:  Abigail White is a 39 y.o. (DOB 1981-04-21) female.  Chief Complaint: No chief complaint on file.   HPI Abigail White presents for follow-up of GAD, MDD, ADHD, insomnia, panic attacks, PTSD.  Describes mood today as "so-so". Tearful at times. Pleasant. Mood symptoms - reports depression, anxiety, and irritability. Reports panic attacks. Having "good and bad days". Thinking about brother - having dreams about him. Worries about the "unknown". Things going "better than they were". Does not feel capable of taking on any new tasks - or returning to work. Having "good days and horrible days". Having a "spell" of not sleeping well. Getting up early to get kids ready for school. Stating "I'm not where I need to be but I'm making steps forward". Still  lacking support - responsible for everything. Husband "doing a lot better". We are taking things "slowly". Attends reformer'a unanimous - support group weekly. Attending church. Continues to grieve the loss of family members. Worried about mother - grandmother. Varying interest and motivation. Taking medications as prescribed.  Energy levels vary - "it waxes and wains". Active, does not have a regular exercise routine.  Enjoys some usual interests and activities. Married. Lives with 3 childen 39 y/o son, 39 year old son, and 763 y/o daughter.Attends church. Appetite adequate. Weight stable 145 to 150. Some days not eating and others can eat.   Sleeping difficulties. Averages 2 to 7 hours - "feels exhausted". Focus and concentration "up and down". Completing tasks. Managing some aspects of household. Out of work currently - does not feel ready to return to work. Denies SI or HI. Denies AH or VH. Seeing a therapist - [facility].  Review of Systems:  Review of Systems  Musculoskeletal: Negative for gait problem.  Neurological: Negative for tremors.  Psychiatric/Behavioral:       Please refer to HPI    Medications: I have reviewed the patient's current medications.  Current Outpatient Medications  Medication Sig Dispense Refill  . albuterol (PROAIR HFA) 108 (90 Base) MCG/ACT inhaler Inhale 2 puffs into the lungs every 6 (six) hours as needed. 8.5 Inhaler 0  . ALPRAZolam (XANAX) 0.25 MG tablet Take 1/2 to one tablet daily as needed for anxiety/panic attacks. 30 tablet 2  . FLOVENT HFA 110 MCG/ACT inhaler TAKE 2 PUFFS BY MOUTH TWICE A DAY 12 Inhaler 3  .  ibuprofen (ADVIL,MOTRIN) 800 MG tablet Take 1 tablet (800 mg total) by mouth every 8 (eight) hours as needed for cramping. 90 tablet 0  . Levonorgestrel (SKYLA) 13.5 MG IUD by Intrauterine route.    . ondansetron (ZOFRAN) 4 MG tablet Take 1 tablet (4 mg total) by mouth every 8 (eight) hours as needed for nausea or vomiting. 20 tablet 0  .  valACYclovir (VALTREX) 500 MG tablet Take 1 tablet as needed for outbreak    . XULANE 150-35 MCG/24HR transdermal patch 1 patch once a week.     No current facility-administered medications for this visit.    Medication Side Effects: None  Allergies:  Allergies  Allergen Reactions  . Effexor [Venlafaxine]     Multiple somatic issues  . Lexapro [Escitalopram Oxalate]     Mania   . Paxil [Paroxetine Hcl]     Feels detached   . Penicillins Hives    Has patient had a PCN reaction causing immediate rash, facial/tongue/throat swelling, SOB or lightheadedness with hypotension: no Has patient had a PCN reaction causing severe rash involving mucus membranes or skin necrosis: yes Has patient had a PCN reaction that required hospitalization no Has patient had a PCN reaction occurring within the last 10 years: no If all of the above answers are "NO", then may proceed with Cephalosporin use.   . Sulfa Antibiotics   . Vicodin [Hydrocodone-Acetaminophen] Nausea And Vomiting    Projectile vomiting  . Wellbutrin [Bupropion]     Became belligerant  . Zoloft [Sertraline Hcl] Hives    rash  . Differin [Adapalene] Rash  . Lamictal [Lamotrigine] Rash  . Latex Rash    Past Medical History:  Diagnosis Date  . ADHD (attention deficit hyperactivity disorder)   . Allergy   . Anemia   . Anxiety    H/O  . Carpal tunnel syndrome    bilateral  . Chest pain    H/O - no current problems - anxiety related  . Chronic fatigue   . Depression    Gestational  . Dyspnea   . GERD (gastroesophageal reflux disease)   . History of chickenpox   . HSV (herpes simplex virus) infection   . Hx of pyelonephritis   . Migraine   . Ovarian cyst   . Palpitations    H/O - no current problems-anxiety related  . PONV (postoperative nausea and vomiting)   . Postpartum care following vaginal delivery (2/13) 06/21/2016  . PTSD (post-traumatic stress disorder)   . Smoker   . Spontaneous vaginal delivery  06/21/2016   svd x 3  . Vaginal Pap smear, abnormal     Family History  Problem Relation Age of Onset  . Coronary artery disease Mother 34  . Hypertension Mother   . Diabetes Mother   . Heart disease Mother   . Kidney Stones Mother   . Asthma Mother   . Kidney disease Mother   . Hyperthyroidism Mother   . COPD Mother   . Heart attack Mother   . Dementia Mother   . Other Father        Shot-violence  . Alcohol abuse Father   . Hypertension Brother   . Kidney Stones Brother   . Hyperlipidemia Brother   . COPD Brother   . Heart attack Brother   . Diabetes Brother   . Hypertension Brother   . Hypertension Maternal Grandmother   . Hyperlipidemia Maternal Grandmother   . Cancer Maternal Grandmother        Skin  .  COPD Maternal Grandmother   . Diabetes Maternal Grandmother   . Cancer Maternal Grandfather        Bone  . Cancer Paternal Grandmother   . Stroke Paternal Grandmother   . Aneurysm Paternal Grandfather   . Hypertension Other   . Diabetes Other   . Asthma Son   . Hypertension Maternal Aunt   . COPD Maternal Aunt   . Diabetes Maternal Aunt   . Heart attack Maternal Aunt   . Cancer Paternal Aunt        Breast    Social History   Socioeconomic History  . Marital status: Married    Spouse name: Not on file  . Number of children: 3  . Years of education: 4  . Highest education level: Not on file  Occupational History  . Occupation: nusre     Employer: OTHER  Tobacco Use  . Smoking status: Current Every Day Smoker    Packs/day: 0.50    Years: 20.00    Pack years: 10.00    Types: Cigarettes  . Smokeless tobacco: Never Used  Vaping Use  . Vaping Use: Every day  Substance and Sexual Activity  . Alcohol use: Yes  . Drug use: No  . Sexual activity: Yes    Birth control/protection: I.U.D.  Other Topics Concern  . Not on file  Social History Narrative  . Not on file   Social Determinants of Health   Financial Resource Strain:   . Difficulty of  Paying Living Expenses: Not on file  Food Insecurity:   . Worried About Programme researcher, broadcasting/film/video in the Last Year: Not on file  . Ran Out of Food in the Last Year: Not on file  Transportation Needs:   . Lack of Transportation (Medical): Not on file  . Lack of Transportation (Non-Medical): Not on file  Physical Activity:   . Days of Exercise per Week: Not on file  . Minutes of Exercise per Session: Not on file  Stress:   . Feeling of Stress : Not on file  Social Connections:   . Frequency of Communication with Friends and Family: Not on file  . Frequency of Social Gatherings with Friends and Family: Not on file  . Attends Religious Services: Not on file  . Active Member of Clubs or Organizations: Not on file  . Attends Banker Meetings: Not on file  . Marital Status: Not on file  Intimate Partner Violence:   . Fear of Current or Ex-Partner: Not on file  . Emotionally Abused: Not on file  . Physically Abused: Not on file  . Sexually Abused: Not on file    Past Medical History, Surgical history, Social history, and Family history were reviewed and updated as appropriate.   Please see review of systems for further details on the patient's review from today.   Objective:   Physical Exam:  There were no vitals taken for this visit.  Physical Exam Constitutional:      General: She is not in acute distress. Musculoskeletal:        General: No deformity.  Neurological:     Mental Status: She is alert and oriented to person, place, and time.     Coordination: Coordination normal.  Psychiatric:        Attention and Perception: Attention and perception normal. She does not perceive auditory or visual hallucinations.        Mood and Affect: Mood normal. Mood is not anxious or depressed. Affect  is not labile, blunt, angry or inappropriate.        Speech: Speech normal.        Behavior: Behavior normal.        Thought Content: Thought content normal. Thought content is not  paranoid or delusional. Thought content does not include homicidal or suicidal ideation. Thought content does not include homicidal or suicidal plan.        Cognition and Memory: Cognition and memory normal.        Judgment: Judgment normal.     Comments: Insight intact     Lab Review:     Component Value Date/Time   NA 139 12/13/2018 1154   K 3.6 12/13/2018 1154   CL 104 12/13/2018 1154   CO2 26 12/13/2018 1154   GLUCOSE 86 12/13/2018 1154   BUN 8 12/13/2018 1154   CREATININE 0.71 12/13/2018 1154   CREATININE 0.67 11/26/2013 1604   CALCIUM 9.5 12/13/2018 1154   PROT 7.0 12/13/2018 1154   ALBUMIN 4.5 12/13/2018 1154   AST 10 12/13/2018 1154   ALT 8 12/13/2018 1154   ALKPHOS 60 12/13/2018 1154   BILITOT 0.5 12/13/2018 1154   GFRNONAA >60 07/31/2018 1414   GFRAA >60 07/31/2018 1414       Component Value Date/Time   WBC 7.7 12/13/2018 1154   RBC 4.57 12/13/2018 1154   HGB 14.5 12/13/2018 1154   HCT 43.4 12/13/2018 1154   PLT 298.0 12/13/2018 1154   MCV 95.0 12/13/2018 1154   MCV 94.4 07/14/2014 1524   MCH 30.4 07/31/2018 1414   MCHC 33.5 12/13/2018 1154   RDW 12.9 12/13/2018 1154   LYMPHSABS 1.7 12/13/2018 1154   MONOABS 0.5 12/13/2018 1154   EOSABS 0.1 12/13/2018 1154   BASOSABS 0.1 12/13/2018 1154    No results found for: POCLITH, LITHIUM   No results found for: PHENYTOIN, PHENOBARB, VALPROATE, CBMZ   .res Assessment: Plan:    Plan:  Xanax 0.25mg  daily PRN anxiety - uses occasionally L-methylfolate   Working with therapist - Tami Ribas.   Patient does not want to trial any further medications.  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   Out of work 01/07/2020 to 02/19/2020 - will re-assess RTW date at next visit.  Multiple failures with SSRI, SNRI, and Wellbutrin - allergic reactions.  Discussed potential benefits, risk, and side effects of benzodiazepines to include potential risk of  tolerance and dependence, as well as possible drowsiness.  Advised patient not to drive if experiencing drowsiness and to take lowest possible effective dose to minimize risk of dependence and tolerance.  Discussed potential benefits, risks, and side effects of stimulants with patient to include increased heart rate, palpitations, insomnia, increased anxiety, increased irritability, or decreased appetite.  Instructed patient to contact office if experiencing any significant tolerability issues.    Diagnoses and all orders for this visit:  Insomnia, unspecified type  Attention deficit hyperactivity disorder (ADHD), unspecified ADHD type  PTSD (post-traumatic stress disorder)  Generalized anxiety disorder  Panic attacks    Please see After Visit Summary for patient specific instructions.  Future Appointments  Date Time Provider Department Center  01/20/2020  8:30 AM Waldon Merl, PA-C [id number]    No orders of the defined types were placed in this encounter.     -------------------------------

## 2020-01-19 ENCOUNTER — Telehealth: Payer: Self-pay | Admitting: Adult Health

## 2020-01-19 NOTE — Telephone Encounter (Signed)
Abigail White from [employer]	[place] [employer] disability called and left a messagestating that she needs to know if she is still out of work on any restrictions. Please call her at [phone] x [phone]

## 2020-01-19 NOTE — Telephone Encounter (Signed)
Do you have anything on her?

## 2020-01-20 ENCOUNTER — Encounter: Payer: Self-pay | Admitting: Physician Assistant

## 2020-01-20 ENCOUNTER — Ambulatory Visit (INDEPENDENT_AMBULATORY_CARE_PROVIDER_SITE_OTHER): Payer: 59 | Admitting: Physician Assistant

## 2020-01-20 ENCOUNTER — Other Ambulatory Visit: Payer: Self-pay

## 2020-01-20 VITALS — BP 92/60 | HR 116 | Temp 98.1°F | Resp 14 | Ht 66.0 in | Wt 157.0 lb

## 2020-01-20 DIAGNOSIS — Z0001 Encounter for general adult medical examination with abnormal findings: Secondary | ICD-10-CM

## 2020-01-20 DIAGNOSIS — R238 Other skin changes: Secondary | ICD-10-CM | POA: Diagnosis not present

## 2020-01-20 DIAGNOSIS — Z23 Encounter for immunization: Secondary | ICD-10-CM | POA: Diagnosis not present

## 2020-01-20 DIAGNOSIS — Z1159 Encounter for screening for other viral diseases: Secondary | ICD-10-CM

## 2020-01-20 DIAGNOSIS — F419 Anxiety disorder, unspecified: Secondary | ICD-10-CM | POA: Diagnosis not present

## 2020-01-20 DIAGNOSIS — R233 Spontaneous ecchymoses: Secondary | ICD-10-CM

## 2020-01-20 DIAGNOSIS — E010 Iodine-deficiency related diffuse (endemic) goiter: Secondary | ICD-10-CM

## 2020-01-20 DIAGNOSIS — Z0000 Encounter for general adult medical examination without abnormal findings: Secondary | ICD-10-CM

## 2020-01-20 DIAGNOSIS — F32A Anxiety disorder, unspecified: Secondary | ICD-10-CM

## 2020-01-20 DIAGNOSIS — F329 Major depressive disorder, single episode, unspecified: Secondary | ICD-10-CM

## 2020-01-20 LAB — CBC WITH DIFFERENTIAL/PLATELET
Basophils Absolute: 0.1 10*3/uL (ref 0.0–0.1)
Basophils Relative: 0.8 % (ref 0.0–3.0)
Eosinophils Absolute: 0.2 10*3/uL (ref 0.0–0.7)
Eosinophils Relative: 2.1 % (ref 0.0–5.0)
HCT: 41.9 % (ref 36.0–46.0)
Hemoglobin: 14 g/dL (ref 12.0–15.0)
Lymphocytes Relative: 19.4 % (ref 12.0–46.0)
Lymphs Abs: 1.7 10*3/uL (ref 0.7–4.0)
MCHC: 33.4 g/dL (ref 30.0–36.0)
MCV: 95.9 fl (ref 78.0–100.0)
Monocytes Absolute: 0.6 10*3/uL (ref 0.1–1.0)
Monocytes Relative: 6.3 % (ref 3.0–12.0)
Neutro Abs: 6.4 10*3/uL (ref 1.4–7.7)
Neutrophils Relative %: 71.4 % (ref 43.0–77.0)
Platelets: 298 10*3/uL (ref 150.0–400.0)
RBC: 4.37 Mil/uL (ref 3.87–5.11)
RDW: 12.9 % (ref 11.5–15.5)
WBC: 8.9 10*3/uL (ref 4.0–10.5)

## 2020-01-20 LAB — COMPREHENSIVE METABOLIC PANEL
ALT: 9 U/L (ref 0–35)
AST: 13 U/L (ref 0–37)
Albumin: 4.3 g/dL (ref 3.5–5.2)
Alkaline Phosphatase: 52 U/L (ref 39–117)
BUN: 10 mg/dL (ref 6–23)
CO2: 27 mEq/L (ref 19–32)
Calcium: 8.9 mg/dL (ref 8.4–10.5)
Chloride: 106 mEq/L (ref 96–112)
Creatinine, Ser: 0.68 mg/dL (ref 0.40–1.20)
GFR: 96.38 mL/min (ref >60.00)
Glucose, Bld: 84 mg/dL (ref 70–99)
Potassium: 4.1 mEq/L (ref 3.5–5.1)
Sodium: 140 mEq/L (ref 135–145)
Total Bilirubin: 0.4 mg/dL (ref 0.2–1.2)
Total Protein: 6.6 g/dL (ref 6.0–8.3)

## 2020-01-20 LAB — LIPID PANEL
Cholesterol: 184 mg/dL (ref 0–200)
HDL: 43.6 mg/dL (ref >39.00)
LDL Cholesterol: 123 mg/dL — ABNORMAL HIGH (ref 0–99)
NonHDL: 140.2
Total CHOL/HDL Ratio: 4
Triglycerides: 86 mg/dL (ref 0.0–149.0)
VLDL: 17.2 mg/dL (ref 0.0–40.0)

## 2020-01-20 LAB — TSH: TSH: 0.91 u[IU]/mL (ref 0.35–4.50)

## 2020-01-20 LAB — APTT: aPTT: 32.2 s (ref 23.4–32.7)

## 2020-01-20 LAB — PROTIME-INR
INR: 1 ratio (ref 0.8–1.0)
Prothrombin Time: 11.3 s (ref 9.6–13.1)

## 2020-01-20 LAB — HEMOGLOBIN A1C: Hgb A1c MFr Bld: 5.6 % (ref 4.6–6.5)

## 2020-01-20 NOTE — Patient Instructions (Signed)
Please go to the lab for blood work.   Our office will call you with your results unless you have chosen to receive results via MyChart.  If your blood work is normal we will follow-up each year for physicals and as scheduled for chronic medical problems.  If anything is abnormal we will treat accordingly and get you in for a follow-up.   Preventive Care 21-39 Years Old, Female Preventive care refers to visits with your health care provider and lifestyle choices that can promote health and wellness. This includes:  A yearly physical exam. This may also be called an annual well check.  Regular dental visits and eye exams.  Immunizations.  Screening for certain conditions.  Healthy lifestyle choices, such as eating a healthy diet, getting regular exercise, not using drugs or products that contain nicotine and tobacco, and limiting alcohol use. What can I expect for my preventive care visit? Physical exam Your health care provider will check your:  Height and weight. This may be used to calculate body mass index (BMI), which tells if you are at a healthy weight.  Heart rate and blood pressure.  Skin for abnormal spots. Counseling Your health care provider may ask you questions about your:  Alcohol, tobacco, and drug use.  Emotional well-being.  Home and relationship well-being.  Sexual activity.  Eating habits.  Work and work environment.  Method of birth control.  Menstrual cycle.  Pregnancy history. What immunizations do I need?  Influenza (flu) vaccine  This is recommended every year. Tetanus, diphtheria, and pertussis (Tdap) vaccine  You may need a Td booster every 10 years. Varicella (chickenpox) vaccine  You may need this if you have not been vaccinated. Human papillomavirus (HPV) vaccine  If recommended by your health care provider, you may need three doses over 6 months. Measles, mumps, and rubella (MMR) vaccine  You may need at least one dose of  MMR. You may also need a second dose. Meningococcal conjugate (MenACWY) vaccine  One dose is recommended if you are age 19-21 years and a first-year college student living in a residence hall, or if you have one of several medical conditions. You may also need additional booster doses. Pneumococcal conjugate (PCV13) vaccine  You may need this if you have certain conditions and were not previously vaccinated. Pneumococcal polysaccharide (PPSV23) vaccine  You may need one or two doses if you smoke cigarettes or if you have certain conditions. Hepatitis A vaccine  You may need this if you have certain conditions or if you travel or work in places where you may be exposed to hepatitis A. Hepatitis B vaccine  You may need this if you have certain conditions or if you travel or work in places where you may be exposed to hepatitis B. Haemophilus influenzae type b (Hib) vaccine  You may need this if you have certain conditions. You may receive vaccines as individual doses or as more than one vaccine together in one shot (combination vaccines). Talk with your health care provider about the risks and benefits of combination vaccines. What tests do I need?  Blood tests  Lipid and cholesterol levels. These may be checked every 5 years starting at age 20.  Hepatitis C test.  Hepatitis B test. Screening  Diabetes screening. This is done by checking your blood sugar (glucose) after you have not eaten for a while (fasting).  Sexually transmitted disease (STD) testing.  BRCA-related cancer screening. This may be done if you have a family history of   breast, ovarian, tubal, or peritoneal cancers.  Pelvic exam and Pap test. This may be done every 3 years starting at age 21. Starting at age 30, this may be done every 5 years if you have a Pap test in combination with an HPV test. Talk with your health care provider about your test results, treatment options, and if necessary, the need for more  tests. Follow these instructions at home: Eating and drinking   Eat a diet that includes fresh fruits and vegetables, whole grains, lean protein, and low-fat dairy.  Take vitamin and mineral supplements as recommended by your health care provider.  Do not drink alcohol if: ? Your health care provider tells you not to drink. ? You are pregnant, may be pregnant, or are planning to become pregnant.  If you drink alcohol: ? Limit how much you have to 0-1 drink a day. ? Be aware of how much alcohol is in your drink. In the [place] one drink equals one 12 oz bottle of beer (355 mL), one 5 oz glass of wine (148 mL), or one 1 oz glass of hard liquor (44 mL). Lifestyle  Take daily care of your teeth and gums.  Stay active. Exercise for at least 30 minutes on 5 or more days each week.  Do not use any products that contain nicotine or tobacco, such as cigarettes, e-cigarettes, and chewing tobacco. If you need help quitting, ask your health care provider.  If you are sexually active, practice safe sex. Use a condom or other form of birth control (contraception) in order to prevent pregnancy and STIs (sexually transmitted infections). If you plan to become pregnant, see your health care provider for a preconception visit. What's next?  Visit your health care provider once a year for a well check visit.  Ask your health care provider how often you should have your eyes and teeth checked.  Stay up to date on all vaccines. This information is not intended to replace advice given to you by your health care provider. Make sure you discuss any questions you have with your health care provider. Document Revised: 01/03/2018 Document Reviewed: 01/03/2018 Elsevier Patient Education  2020 [employer] .      

## 2020-01-20 NOTE — Progress Notes (Signed)
Patient presents to clinic today for annual exam.  Patient is fasting for labs.  Acute Concerns: Denies acute concerns at today's visit.    Health Maintenance: Immunizations -- Tetanus up-to-date. Would like flu shot today.  PAP -- followed by GYN. Records requested.   Past Medical History:  Diagnosis Date   ADHD (attention deficit hyperactivity disorder)    Allergy    Anemia    Anxiety    H/O   Carpal tunnel syndrome    bilateral   Chest pain    H/O - no current problems - anxiety related   Chronic fatigue    Depression    Gestational   Dyspnea    GERD (gastroesophageal reflux disease)    History of chickenpox    HSV (herpes simplex virus) infection    Hx of pyelonephritis    Migraine    Ovarian cyst    Palpitations    H/O - no current problems-anxiety related   PONV (postoperative nausea and vomiting)    Postpartum care following vaginal delivery (2/13) 06/21/2016   PTSD (post-traumatic stress disorder)    Smoker    Spontaneous vaginal delivery 06/21/2016   svd x 3   Vaginal Pap smear, abnormal     Past Surgical History:  Procedure Laterality Date   LEEP  2005, 2013   x 2   NASAL SINUS SURGERY  1999   WISDOM TOOTH EXTRACTION      Current Outpatient Medications on File Prior to Visit  Medication Sig Dispense Refill   albuterol (PROAIR HFA) 108 (90 Base) MCG/ACT inhaler Inhale 2 puffs into the lungs every 6 (six) hours as needed. 8.5 Inhaler 0   ALPRAZolam (XANAX) 0.25 MG tablet Take 1/2 to one tablet daily as needed for anxiety/panic attacks. 30 tablet 2   ibuprofen (ADVIL,MOTRIN) 800 MG tablet Take 1 tablet (800 mg total) by mouth every 8 (eight) hours as needed for cramping. 90 tablet 0   ondansetron (ZOFRAN) 4 MG tablet Take 1 tablet (4 mg total) by mouth every 8 (eight) hours as needed for nausea or vomiting. 20 tablet 0   valACYclovir (VALTREX) 500 MG tablet Take 1 tablet as needed for outbreak     FLOVENT HFA 110  MCG/ACT inhaler TAKE 2 PUFFS BY MOUTH TWICE A DAY (Patient not taking: Reported on 01/20/2020) 12 Inhaler 3   No current facility-administered medications on file prior to visit.    Allergies  Allergen Reactions   Effexor [Venlafaxine]     Multiple somatic issues   Lexapro [Escitalopram Oxalate]     Mania    Paxil [Paroxetine Hcl]     Feels detached    Penicillins Hives    Has patient had a PCN reaction causing immediate rash, facial/tongue/throat swelling, SOB or lightheadedness with hypotension: no Has patient had a PCN reaction causing severe rash involving mucus membranes or skin necrosis: yes Has patient had a PCN reaction that required hospitalization no Has patient had a PCN reaction occurring within the last 10 years: no If all of the above answers are "NO", then may proceed with Cephalosporin use.    Sulfa Antibiotics    Vicodin [Hydrocodone-Acetaminophen] Nausea And Vomiting    Projectile vomiting   Wellbutrin [Bupropion]     Became belligerant   Zoloft [Sertraline Hcl] Hives    rash   Differin [Adapalene] Rash   Lamictal [Lamotrigine] Rash   Latex Rash    Family History  Problem Relation Age of Onset   Coronary artery disease  Mother 39   Hypertension Mother    Diabetes Mother    Heart disease Mother    Kidney Stones Mother    Asthma Mother    Kidney disease Mother    Hyperthyroidism Mother    COPD Mother    Heart attack Mother    Dementia Mother    Other Father        Shot-violence   Alcohol abuse Father    Hypertension Brother    Kidney Stones Brother    Hyperlipidemia Brother    COPD Brother    Heart attack Brother    Diabetes Brother    Hypertension Brother    Hypertension Maternal Grandmother    Hyperlipidemia Maternal Grandmother    Cancer Maternal Grandmother        Skin   COPD Maternal Grandmother    Diabetes Maternal Grandmother    Cancer Maternal Grandfather        Bone   Cancer Paternal  Grandmother    Stroke Paternal Grandmother    Aneurysm Paternal Grandfather    Hypertension Other    Diabetes Other    Asthma Son    Hypertension Maternal Aunt    COPD Maternal Aunt    Diabetes Maternal Aunt    Heart attack Maternal Aunt    Cancer Paternal Aunt        Breast    Social History   Socioeconomic History   Marital status: Married    Spouse name: Not on file   Number of children: 3   Years of education: 4   Highest education level: Not on file  Occupational History   Occupation: nusre     Associate Professor: [employer]  Tobacco Use   Smoking status: Current Every Day Smoker    Packs/day: 1.00    Years: 20.00    Pack years: 20.00    Types: Cigarettes   Smokeless tobacco: Never Used  Building services engineer Use: Every day  Substance and Sexual Activity   Alcohol use: Yes   Drug use: No   Sexual activity: Yes  Other Topics Concern   Not on file  Social History Narrative   Not on file   Social Determinants of Health   Financial Resource Strain:    Difficulty of Paying Living Expenses: Not on file  Food Insecurity:    Worried About Programme researcher, broadcasting/film/video in the Last Year: Not on file   [employer] of Food in the Last Year: Not on file  Transportation Needs:    Lack of Transportation (Medical): Not on file   Lack of Transportation (Non-Medical): Not on file  Physical Activity:    Days of Exercise per Week: Not on file   Minutes of Exercise per Session: Not on file  Stress:    Feeling of Stress : Not on file  Social Connections:    Frequency of Communication with Friends and Family: Not on file   Frequency of Social Gatherings with Friends and Family: Not on file   Attends Religious Services: Not on file   Active Member of Clubs or Organizations: Not on file   Attends Banker Meetings: Not on file   Marital Status: Not on file  Intimate Partner Violence:    Fear of Current or Ex-Partner: Not on file   Emotionally Abused:  Not on file   Physically Abused: Not on file   Sexually Abused: Not on file   Review of Systems  Constitutional: Negative for fever and weight loss.  HENT: Negative for ear discharge, ear pain, hearing loss and tinnitus.   Eyes: Negative for blurred vision, double vision, photophobia and pain.  Respiratory: Negative for cough and shortness of breath.   Cardiovascular: Negative for chest pain and palpitations.  Gastrointestinal: Negative for abdominal pain, blood in stool, constipation, diarrhea, heartburn, melena, nausea and vomiting.  Genitourinary: Negative for dysuria, flank pain, frequency, hematuria and urgency.  Musculoskeletal: Negative for falls.  Neurological: Negative for dizziness, loss of consciousness and headaches.  Endo/Heme/Allergies: Negative for environmental allergies.  Psychiatric/Behavioral: Negative for depression, hallucinations, substance abuse and suicidal ideas. The patient is not nervous/anxious and does not have insomnia.    BP 92/60    Pulse (!) 116    Temp 98.1 F (36.7 C) (Temporal)    Resp 14    Ht 5\' 6"  (1.676 m)    Wt 157 lb (71.2 kg)    SpO2 99%    BMI 25.34 kg/m   Physical Exam Vitals reviewed.  HENT:     Head: Normocephalic and atraumatic.     Right Ear: Tympanic membrane, ear canal and external ear normal.     Left Ear: Tympanic membrane, ear canal and external ear normal.     Nose: Nose normal. No mucosal edema.     Mouth/Throat:     Pharynx: Uvula midline. No oropharyngeal exudate or posterior oropharyngeal erythema.  Eyes:     Conjunctiva/sclera: Conjunctivae normal.     Pupils: Pupils are equal, round, and reactive to light.  Neck:     Thyroid: No thyromegaly.  Cardiovascular:     Rate and Rhythm: Normal rate and regular rhythm.     Heart sounds: Normal heart sounds.  Pulmonary:     Effort: Pulmonary effort is normal. No respiratory distress.     Breath sounds: Normal breath sounds. No wheezing or rales.  Abdominal:     General:  Bowel sounds are normal. There is no distension.     Palpations: Abdomen is soft. There is no mass.     Tenderness: There is no abdominal tenderness. There is no guarding or rebound.  Musculoskeletal:     Cervical back: Neck supple.  Lymphadenopathy:     Cervical: No cervical adenopathy.  Skin:    General: Skin is warm and dry.     Findings: No rash.  Neurological:     Mental Status: She is alert and oriented to person, place, and time.     Cranial Nerves: No cranial nerve deficit.    Assessment/Plan: 1. Visit for preventive health examination Depression screen negative. Health Maintenance reviewed. Preventive schedule discussed and handout given in AVS. Will obtain fasting labs today. - CBC with Differential/Platelet - Comprehensive metabolic panel - Lipid panel - Hemoglobin A1c  2. History of Thyromegaly Note noted on examination today. Last negative for thyromegaly Repeat TSH levels to ensure stability.  - TSH  3. Anxiety and depression Continue management per Psychiatry.  4. Need for hepatitis C screening test - Hepatitis C Antibody  5. Easy bruising Noted at end of visit. Examination unremarkable. Will check PT and aPTT along with LFTs today to further assess.  - Comprehensive metabolic panel - Protime-INR ( [facility] ONLY) - PTT  6. Need for immunization against influenza Flu shot given today. - Flu Vaccine QUAD 36+ mos IM   This visit occurred during the SARS-CoV-2 public health emergency.  Safety protocols were in place, including screening questions prior to the visit, additional usage of staff PPE, and extensive cleaning of  exam room while observing appropriate contact time as indicated for disinfecting solutions.    Piedad Climes, PA-C

## 2020-01-21 ENCOUNTER — Telehealth: Payer: Self-pay

## 2020-01-21 DIAGNOSIS — Z0289 Encounter for other administrative examinations: Secondary | ICD-10-CM

## 2020-01-21 LAB — HEPATITIS C ANTIBODY
Hepatitis C Ab: NONREACTIVE
SIGNAL TO CUT-OFF: 0.01 (ref <1.00)

## 2020-01-21 NOTE — Telephone Encounter (Signed)
FMLA complete °

## 2020-01-21 NOTE — Telephone Encounter (Signed)
Noted thank you

## 2020-01-22 ENCOUNTER — Telehealth: Payer: Self-pay | Admitting: Physician Assistant

## 2020-01-22 ENCOUNTER — Other Ambulatory Visit: Payer: Self-pay | Admitting: Emergency Medicine

## 2020-01-22 ENCOUNTER — Other Ambulatory Visit: Payer: Self-pay

## 2020-01-22 ENCOUNTER — Other Ambulatory Visit: Payer: Self-pay | Admitting: Physician Assistant

## 2020-01-22 DIAGNOSIS — B9689 Other specified bacterial agents as the cause of diseases classified elsewhere: Secondary | ICD-10-CM

## 2020-01-22 DIAGNOSIS — J019 Acute sinusitis, unspecified: Secondary | ICD-10-CM

## 2020-01-22 MED ORDER — ONDANSETRON HCL 4 MG PO TABS: 4.0000 mg | ORAL_TABLET | Freq: Three times a day (TID) | ORAL | 0 refills | Status: DC | PRN

## 2020-01-22 MED ORDER — ALBUTEROL SULFATE HFA 108 (90 BASE) MCG/ACT IN AERS: 2.0000 | INHALATION_SPRAY | Freq: Four times a day (QID) | RESPIRATORY_TRACT | 2 refills | Status: DC | PRN

## 2020-01-22 MED ORDER — IBUPROFEN 800 MG PO TABS
800.0000 mg | ORAL_TABLET | Freq: Three times a day (TID) | ORAL | 0 refills | Status: DC | PRN
Start: 2020-01-22 — End: 2021-02-10

## 2020-01-22 MED ORDER — FLOVENT HFA 110 MCG/ACT IN AERO: INHALATION_SPRAY | RESPIRATORY_TRACT | 2 refills | Status: AC

## 2020-01-22 NOTE — Telephone Encounter (Signed)
Pt called in asking for refills on the Ibuprofen and Zofran. Pt uses [facility] in [place].

## 2020-01-22 NOTE — Telephone Encounter (Signed)
Forms completed

## 2020-01-22 NOTE — Telephone Encounter (Signed)
Prescriptions in refill request. Completed and medications refilled to the pharmacy

## 2020-02-02 ENCOUNTER — Encounter: Payer: Self-pay | Admitting: Physician Assistant

## 2020-02-26 ENCOUNTER — Other Ambulatory Visit: Payer: Self-pay | Admitting: Physician Assistant

## 2020-03-01 ENCOUNTER — Telehealth (INDEPENDENT_AMBULATORY_CARE_PROVIDER_SITE_OTHER): Payer: 59 | Admitting: Adult Health

## 2020-03-01 DIAGNOSIS — G4700 Insomnia, unspecified: Secondary | ICD-10-CM | POA: Diagnosis not present

## 2020-03-01 DIAGNOSIS — F410 Panic disorder [episodic paroxysmal anxiety] without agoraphobia: Secondary | ICD-10-CM | POA: Diagnosis not present

## 2020-03-01 DIAGNOSIS — F909 Attention-deficit hyperactivity disorder, unspecified type: Secondary | ICD-10-CM

## 2020-03-01 DIAGNOSIS — F4310 Post-traumatic stress disorder, unspecified: Secondary | ICD-10-CM

## 2020-03-01 DIAGNOSIS — F411 Generalized anxiety disorder: Secondary | ICD-10-CM | POA: Diagnosis not present

## 2020-03-01 MED ORDER — ALPRAZOLAM 0.25 MG PO TABS: ORAL_TABLET | ORAL | 2 refills | Status: DC

## 2020-03-01 NOTE — Progress Notes (Addendum)
Abigail White [id number] Jul 13, 1980 39 y.o.  Virtual Visit via Telephone Note  I connected with pt on 03/01/20 at 11:20 AM EDT by telephone and verified that I am speaking with the correct person using two identifiers.   I discussed the limitations, risks, security and privacy concerns of performing an evaluation and management service by telephone and the availability of in person appointments. I also discussed with the patient that there may be a patient responsible charge related to this service. The patient expressed understanding and agreed to proceed.   I discussed the assessment and treatment plan with the patient. The patient was provided an opportunity to ask questions and all were answered. The patient agreed with the plan and demonstrated an understanding of the instructions.   The patient was advised to call back or seek an in-person evaluation if the symptoms worsen or if the condition fails to improve as anticipated.  I provided 30 minutes of non-face-to-face time during this encounter.  The patient was located at home.  The provider [facility].   Dorothyann Gibbsegina N Stiven Kaspar, NP   Subjective:   Patient ID:  Abigail White is a 39 y.o. (DOB Jul 13, 1980) female.  Chief Complaint: No chief complaint on file.   HPI Abigail White [name] presents for follow-up of GAD, MDD, ADHD, insomnia, panic attacks, PTSD.  Describes mood today as "not good". Tearful at times. Pleasant. Mood symptoms - reports depression, anxiety, and irritability. Reports panic attacks - taking Xanax more often. Stating "it's been rough". Experiencing a lot of "grief". Feels stressed. Having "good and bad" days. Having a lot of "triggers". Daughter sick. Worries about money. Has been fired from [employer]. Husband at home. Feels like everything is "stressful". Does not fell like she is ready to return to work setting. Attends church - out for the past 8 weeks. Varying interest and motivation. Taking  medications as prescribed. Seeing therapist weekly - Isiah Collins. Energy levels low - feels like she may be coming down with something. Active, does not have a regular exercise routine. Trying to walk some days. Enjoys some usual interests and activities. Married. Lives with 3 childen 39 y/o son, 39 year old son, and 143 y/o daughter. Attends church. Appetite adequate. Weight stable 145 to 150 pounds.  Sleeping difficulties. Averages 3 to 5 hours. Focus and concentration "crappy as usual". Completing tasks. Managing some aspects of household. Out of work currently - does not feel she is ready to return to work setting. Denies SI or HI. Denies AH or VH.  Review of Systems:  Review of Systems  Musculoskeletal: Negative for gait problem.  Neurological: Negative for tremors.  Psychiatric/Behavioral:       Please refer to HPI    Medications: I have reviewed the patient's current medications.  Current Outpatient Medications  Medication Sig Dispense Refill  . albuterol (PROAIR HFA) 108 (90 Base) MCG/ACT inhaler Inhale 2 puffs into the lungs every 6 (six) hours as needed. 18 g 2  . ALPRAZolam (XANAX) 0.25 MG tablet Take 1/2 to one tablet daily as needed for anxiety/panic attacks. 30 tablet 2  . fluticasone (FLOVENT HFA) 110 MCG/ACT inhaler TAKE 2 PUFFS BY MOUTH TWICE A DAY 12 g 2  . ibuprofen (ADVIL) 800 MG tablet Take 1 tablet (800 mg total) by mouth every 8 (eight) hours as needed for cramping. 90 tablet 0  . ondansetron (ZOFRAN) 4 MG tablet Take 1 tablet (4 mg total) by mouth every 8 (eight) hours as needed  for nausea or vomiting. 20 tablet 0  . valACYclovir (VALTREX) 500 MG tablet Take 1 tablet as needed for outbreak     No current facility-administered medications for this visit.    Medication Side Effects: None  Allergies:  Allergies  Allergen Reactions  . Effexor [Venlafaxine]     Multiple somatic issues  . Lexapro [Escitalopram Oxalate]     Mania   . Paxil [Paroxetine Hcl]      Feels detached   . Penicillins Hives    Has patient had a PCN reaction causing immediate rash, facial/tongue/throat swelling, SOB or lightheadedness with hypotension: no Has patient had a PCN reaction causing severe rash involving mucus membranes or skin necrosis: yes Has patient had a PCN reaction that required hospitalization no Has patient had a PCN reaction occurring within the last 10 years: no If all of the above answers are "NO", then may proceed with Cephalosporin use.   . Sulfa Antibiotics   . Vicodin [Hydrocodone-Acetaminophen] Nausea And Vomiting    Projectile vomiting  . Wellbutrin [Bupropion]     Became belligerant  . Zoloft [Sertraline Hcl] Hives    rash  . Differin [Adapalene] Rash  . Lamictal [Lamotrigine] Rash  . Latex Rash    Past Medical History:  Diagnosis Date  . ADHD (attention deficit hyperactivity disorder)   . Allergy   . Anemia   . Anxiety    H/O  . Carpal tunnel syndrome    bilateral  . Chest pain    H/O - no current problems - anxiety related  . Chronic fatigue   . Depression    Gestational  . Dyspnea   . GERD (gastroesophageal reflux disease)   . History of chickenpox   . HSV (herpes simplex virus) infection   . Hx of pyelonephritis   . Migraine   . Ovarian cyst   . Palpitations    H/O - no current problems-anxiety related  . PONV (postoperative nausea and vomiting)   . Postpartum care following vaginal delivery (2/13) 06/21/2016  . PTSD (post-traumatic stress disorder)   . Smoker   . Spontaneous vaginal delivery 06/21/2016   svd x 3  . Vaginal Pap smear, abnormal     Family History  Problem Relation Age of Onset  . Coronary artery disease Mother 61  . Hypertension Mother   . Diabetes Mother   . Heart disease Mother   . Kidney Stones Mother   . Asthma Mother   . Kidney disease Mother   . Hyperthyroidism Mother   . COPD Mother   . Heart attack Mother   . Dementia Mother   . Other Father        Shot-violence  . Alcohol  abuse Father   . Hypertension Brother   . Kidney Stones Brother   . Hyperlipidemia Brother   . COPD Brother   . Heart attack Brother   . Diabetes Brother   . Hypertension Brother   . Hypertension Maternal Grandmother   . Hyperlipidemia Maternal Grandmother   . Cancer Maternal Grandmother        Skin  . COPD Maternal Grandmother   . Diabetes Maternal Grandmother   . Cancer Maternal Grandfather        Bone  . Cancer Paternal Grandmother   . Stroke Paternal Grandmother   . Aneurysm Paternal Grandfather   . Hypertension Other   . Diabetes Other   . Asthma Son   . Hypertension Maternal Aunt   . COPD Maternal Aunt   .  Diabetes Maternal Aunt   . Heart attack Maternal Aunt   . Cancer Paternal Aunt        Breast    Social History   Socioeconomic History  . Marital status: Married    Spouse name: Not on file  . Number of children: 3  . Years of education: 4  . Highest education level: Not on file  Occupational History  . Occupation: nusre     Employer: OTHER  Tobacco Use  . Smoking status: Current Every Day Smoker    Packs/day: 1.00    Years: 20.00    Pack years: 20.00    Types: Cigarettes  . Smokeless tobacco: Never Used  Vaping Use  . Vaping Use: Every day  Substance and Sexual Activity  . Alcohol use: Yes  . Drug use: No  . Sexual activity: Yes  Other Topics Concern  . Not on file  Social History Narrative  . Not on file   Social Determinants of Health   Financial Resource Strain:   . Difficulty of Paying Living Expenses: Not on file  Food Insecurity:   . Worried About Programme researcher, broadcasting/film/video in the Last Year: Not on file  . Ran Out of Food in the Last Year: Not on file  Transportation Needs:   . Lack of Transportation (Medical): Not on file  . Lack of Transportation (Non-Medical): Not on file  Physical Activity:   . Days of Exercise per Week: Not on file  . Minutes of Exercise per Session: Not on file  Stress:   . Feeling of Stress : Not on file  Social  Connections:   . Frequency of Communication with Friends and Family: Not on file  . Frequency of Social Gatherings with Friends and Family: Not on file  . Attends Religious Services: Not on file  . Active Member of Clubs or Organizations: Not on file  . Attends Banker Meetings: Not on file  . Marital Status: Not on file  Intimate Partner Violence:   . Fear of Current or Ex-Partner: Not on file  . Emotionally Abused: Not on file  . Physically Abused: Not on file  . Sexually Abused: Not on file    Past Medical History, Surgical history, Social history, and Family history were reviewed and updated as appropriate.   Please see review of systems for further details on the patient's review from today.   Objective:   Physical Exam:  There were no vitals taken for this visit.  Physical Exam Neurological:     Mental Status: She is alert and oriented to person, place, and time.     Cranial Nerves: No dysarthria.  Psychiatric:        Attention and Perception: Attention and perception normal.        Mood and Affect: Mood is anxious and depressed. Affect is tearful.        Speech: Speech normal.        Behavior: Behavior is cooperative.        Thought Content: Thought content normal. Thought content is not paranoid or delusional. Thought content does not include homicidal or suicidal ideation. Thought content does not include homicidal or suicidal plan.        Cognition and Memory: Cognition and memory normal.        Judgment: Judgment normal.     Comments: Insight intact     Lab Review:     Component Value Date/Time   NA 140 01/20/2020 0915  K 4.1 01/20/2020 0915   CL 106 01/20/2020 0915   CO2 27 01/20/2020 0915   GLUCOSE 84 01/20/2020 0915   BUN 10 01/20/2020 0915   CREATININE 0.68 01/20/2020 0915   CREATININE 0.67 11/26/2013 1604   CALCIUM 8.9 01/20/2020 0915   PROT 6.6 01/20/2020 0915   ALBUMIN 4.3 01/20/2020 0915   AST 13 01/20/2020 0915   ALT 9 01/20/2020  0915   ALKPHOS 52 01/20/2020 0915   BILITOT 0.4 01/20/2020 0915   GFRNONAA >60 07/31/2018 1414   GFRAA >60 07/31/2018 1414       Component Value Date/Time   WBC 8.9 01/20/2020 0915   RBC 4.37 01/20/2020 0915   HGB 14.0 01/20/2020 0915   HCT 41.9 01/20/2020 0915   PLT 298.0 01/20/2020 0915   MCV 95.9 01/20/2020 0915   MCV 94.4 07/14/2014 1524   MCH 30.4 07/31/2018 1414   MCHC 33.4 01/20/2020 0915   RDW 12.9 01/20/2020 0915   LYMPHSABS 1.7 01/20/2020 0915   MONOABS 0.6 01/20/2020 0915   EOSABS 0.2 01/20/2020 0915   BASOSABS 0.1 01/20/2020 0915    No results found for: POCLITH, LITHIUM   No results found for: PHENYTOIN, PHENOBARB, VALPROATE, CBMZ   .res Assessment: Plan:    Plan:  Xanax 0.25mg  daily PRN anxiety - uses occasionally L-methylfolate   Working with therapist - Tami Ribas.   Patient does not want to trial any further medications.  Out of work currently - does not feel ready to return to work - currently on STD.   Out of work 02/19/2020 to 04/12/2020 - will re-assess RTW date at next visit.  Multiple failures with SSRI, SNRI, and Wellbutrin - allergic reactions.  Discussed potential benefits, risk, and side effects of benzodiazepines to include potential risk of tolerance and dependence, as well as possible drowsiness.  Advised patient not to drive if experiencing drowsiness and to take lowest possible effective dose to minimize risk of dependence and tolerance.  Discussed potential benefits, risks, and side effects of stimulants with patient to include increased heart rate, palpitations, insomnia, increased anxiety, increased irritability, or decreased appetite.  Instructed patient to contact office if experiencing any significant tolerability issues.   Diagnoses and all orders for this visit:  Generalized anxiety disorder  Panic attacks -     ALPRAZolam (XANAX) 0.25 MG tablet; Take 1/2 to one tablet daily as needed for anxiety/panic  attacks.  Attention deficit hyperactivity disorder (ADHD), unspecified ADHD type -     ALPRAZolam (XANAX) 0.25 MG tablet; Take 1/2 to one tablet daily as needed for anxiety/panic attacks.  Insomnia, unspecified type -     ALPRAZolam (XANAX) 0.25 MG tablet; Take 1/2 to one tablet daily as needed for anxiety/panic attacks.  PTSD (post-traumatic stress disorder)    Please see After Visit Summary for patient specific instructions.  No future appointments.  No orders of the defined types were placed in this encounter.     -------------------------------

## 2020-03-03 ENCOUNTER — Telehealth: Payer: Self-pay | Admitting: Adult Health

## 2020-03-03 NOTE — Telephone Encounter (Signed)
Received fax from [facility] on [place]. Form needs completed.

## 2020-03-04 NOTE — Telephone Encounter (Signed)
Error

## 2020-04-12 ENCOUNTER — Telehealth (INDEPENDENT_AMBULATORY_CARE_PROVIDER_SITE_OTHER): Payer: 59 | Admitting: Adult Health

## 2020-04-12 ENCOUNTER — Encounter: Payer: Self-pay | Admitting: Adult Health

## 2020-04-12 DIAGNOSIS — F410 Panic disorder [episodic paroxysmal anxiety] without agoraphobia: Secondary | ICD-10-CM | POA: Diagnosis not present

## 2020-04-12 DIAGNOSIS — F411 Generalized anxiety disorder: Secondary | ICD-10-CM

## 2020-04-12 DIAGNOSIS — G4700 Insomnia, unspecified: Secondary | ICD-10-CM | POA: Diagnosis not present

## 2020-04-12 DIAGNOSIS — F909 Attention-deficit hyperactivity disorder, unspecified type: Secondary | ICD-10-CM | POA: Diagnosis not present

## 2020-04-12 DIAGNOSIS — F4310 Post-traumatic stress disorder, unspecified: Secondary | ICD-10-CM

## 2020-04-12 MED ORDER — ALPRAZOLAM 0.25 MG PO TABS: ORAL_TABLET | ORAL | 2 refills | Status: DC

## 2020-04-12 NOTE — Progress Notes (Signed)
Abigail White [id number] 11-Aug-1980 39 y.o.  Subjective:   Patient ID:  Abigail Senateshley M Rambeau is a 39 y.o. (DOB 11-Aug-1980) female.  Chief Complaint: No chief complaint on file.   HPI Abigail Senateshley M Clements presents to the office today for follow-up of GAD, MDD, ADHD, insomnia, panic attacks, PTSD.  Describes mood today as "emotional". Tearful at times. Pleasant. Mood symptoms - reports depression, anxiety, and irritability. Feels "extremely anxious". Reports panic attacks. Stating "I can't find a happy normal". Still grieving loss of brother. Feels frustrated with herself". Stating "I have always worked". Getting overwhelmed at times. Stating "I always have more to do than I can get done". Struggling financially. Chronic pain issues. Receiving disability benefits from previously employer. Has been fired from [employer]. Working with disability office for continued benefits. Daughter sick with pneumonia "on and off" since July. Currently, she has a sinus infection and is on an antibiotic. Husband at home - works night shift. Attends church. Varying interest and motivation. Taking medications as prescribed. Seeing therapist weekly - Isiah Collins. Energy levels low. Active, does not have a regular exercise routine.  Enjoys some usual interests and activities. Married. Lives with 3 childen 112 y/o son, 39 year old son, and 443 y/o daughter. Extended family local. Appetite adequate. Weight stable 145 to 150 pounds.  Sleeping difficulties - sleeps in "cycles". Averages 3 to 5 hours. Focus and concentration difficulties. Completing tasks. Managing some aspects of household. Out of work currently - does not feel she is ready to return to work setting. Denies SI or HI.  Denies AH or VH.   GAD-7     [facility]  Total GAD-7 Score 18 14    PHQ2-9     [facility]  [facility]  PHQ-2 Total Score 1 3 4 2  0  PHQ-9 Total Score 5 12 12 3  0       Review of Systems:  Review of Systems  Musculoskeletal: Negative for gait problem.  Neurological: Negative for tremors.  Psychiatric/Behavioral:       Please refer to HPI    Medications: I have reviewed the patient's current medications.  Current Outpatient Medications  Medication Sig Dispense Refill  . albuterol (PROAIR HFA) 108 (90 Base) MCG/ACT inhaler Inhale 2 puffs into the lungs every 6 (six) hours as needed. 18 g 2  . ALPRAZolam (XANAX) 0.25 MG tablet Take 1/2 to one tablet daily as needed for anxiety/panic attacks. 30 tablet 2  . fluticasone (FLOVENT HFA) 110 MCG/ACT inhaler TAKE 2 PUFFS BY MOUTH TWICE A DAY 12 g 2  . ibuprofen (ADVIL) 800 MG tablet Take 1 tablet (800 mg total) by mouth every 8 (eight) hours as needed for cramping. 90 tablet 0  . ondansetron (ZOFRAN) 4 MG tablet Take 1 tablet (4 mg total) by mouth every 8 (eight) hours as needed for nausea or vomiting. 20 tablet 0  . valACYclovir (VALTREX) 500 MG tablet Take 1 tablet as needed for outbreak     No current facility-administered medications for this visit.    Medication Side Effects: None  Allergies:  Allergies  Allergen Reactions  . Effexor [  Venlafaxine]     Multiple somatic issues  . Lexapro [Escitalopram Oxalate]     Mania   . Paxil [Paroxetine Hcl]     Feels detached   . Penicillins Hives    Has patient had a PCN reaction causing immediate rash, facial/tongue/throat swelling, SOB or lightheadedness with hypotension: no Has patient had a PCN  reaction causing severe rash involving mucus membranes or skin necrosis: yes Has patient had a PCN reaction that required hospitalization no Has patient had a PCN reaction occurring within the last 10 years: no If all of the above answers are "NO", then may proceed with Cephalosporin use.   . Sulfa Antibiotics   . Vicodin [Hydrocodone-Acetaminophen] Nausea And Vomiting    Projectile vomiting  . Wellbutrin [Bupropion]     Became belligerant  . Zoloft [Sertraline Hcl] Hives    rash  . Differin [Adapalene] Rash  . Lamictal [Lamotrigine] Rash  . Latex Rash    Past Medical History:  Diagnosis Date  . ADHD (attention deficit hyperactivity disorder)   . Allergy   . Anemia   . Anxiety    H/O  . Carpal tunnel syndrome    bilateral  . Chest pain    H/O - no current problems - anxiety related  . Chronic fatigue   . Depression    Gestational  . Dyspnea   . GERD (gastroesophageal reflux disease)   . History of chickenpox   . HSV (herpes simplex virus) infection   . Hx of pyelonephritis   . Migraine   . Ovarian cyst   . Palpitations    H/O - no current problems-anxiety related  . PONV (postoperative nausea and vomiting)   . Postpartum care following vaginal delivery (2/13) 06/21/2016  . PTSD (post-traumatic stress disorder)   . Smoker   . Spontaneous vaginal delivery 06/21/2016   svd x 3  . Vaginal Pap smear, abnormal     Family History  Problem Relation Age of Onset  . Coronary artery disease Mother 37  . Hypertension Mother   . Diabetes Mother   . Heart disease Mother   . Kidney Stones Mother   . Asthma Mother   . Kidney disease Mother   . Hyperthyroidism Mother   . COPD Mother   . Heart attack Mother   . Dementia Mother   . Other Father        Shot-violence  . Alcohol abuse Father   . Hypertension Brother   . Kidney Stones Brother   . Hyperlipidemia Brother   . COPD Brother   . Heart attack Brother   . Diabetes Brother   . Hypertension Brother   .  Hypertension Maternal Grandmother   . Hyperlipidemia Maternal Grandmother   . Cancer Maternal Grandmother        Skin  . COPD Maternal Grandmother   . Diabetes Maternal Grandmother   . Cancer Maternal Grandfather        Bone  . Cancer Paternal Grandmother   . Stroke Paternal Grandmother   . Aneurysm Paternal Grandfather   . Hypertension Other   . Diabetes Other   . Asthma Son   . Hypertension Maternal Aunt   . COPD Maternal Aunt   . Diabetes Maternal Aunt   . Heart attack Maternal Aunt   . Cancer Paternal Aunt        Breast    Social History   Socioeconomic History  . Marital status: Married    Spouse name: Not on file  .  Number of children: 3  . Years of education: 4  . Highest education level: Not on file  Occupational History  . Occupation: nusre     Employer: OTHER  Tobacco Use  . Smoking status: Current Every Day Smoker    Packs/day: 1.00    Years: 20.00    Pack years: 20.00    Types: Cigarettes  . Smokeless tobacco: Never Used  Vaping Use  . Vaping Use: Every day  Substance and Sexual Activity  . Alcohol use: Yes  . Drug use: No  . Sexual activity: Yes  Other Topics Concern  . Not on file  Social History Narrative  . Not on file   Social Determinants of Health   Financial Resource Strain:   . Difficulty of Paying Living Expenses: Not on file  Food Insecurity:   . Worried About Programme researcher, broadcasting/film/video in the Last Year: Not on file  . Ran Out of Food in the Last Year: Not on file  Transportation Needs:   . Lack of Transportation (Medical): Not on file  . Lack of Transportation (Non-Medical): Not on file  Physical Activity:   . Days of Exercise per Week: Not on file  . Minutes of Exercise per Session: Not on file  Stress:   . Feeling of Stress : Not on file  Social Connections:   . Frequency of Communication with Friends and Family: Not on file  . Frequency of Social Gatherings with Friends and Family: Not on file  . Attends Religious Services: Not  on file  . Active Member of Clubs or Organizations: Not on file  . Attends Banker Meetings: Not on file  . Marital Status: Not on file  Intimate Partner Violence:   . Fear of Current or Ex-Partner: Not on file  . Emotionally Abused: Not on file  . Physically Abused: Not on file  . Sexually Abused: Not on file    Past Medical History, Surgical history, Social history, and Family history were reviewed and updated as appropriate.   Please see review of systems for further details on the patient's review from today.   Objective:   Physical Exam:  There were no vitals taken for this visit.  Physical Exam Constitutional:      General: She is not in acute distress. Musculoskeletal:        General: No deformity.  Neurological:     Mental Status: She is alert and oriented to person, place, and time.     Coordination: Coordination normal.  Psychiatric:        Attention and Perception: Attention and perception normal. She does not perceive auditory or visual hallucinations.        Mood and Affect: Mood normal. Mood is not anxious or depressed. Affect is not labile, blunt, angry or inappropriate.        Speech: Speech normal.        Behavior: Behavior normal.        Thought Content: Thought content normal. Thought content is not paranoid or delusional. Thought content does not include homicidal or suicidal ideation. Thought content does not include homicidal or suicidal plan.        Cognition and Memory: Cognition and memory normal.        Judgment: Judgment normal.     Comments: Insight intact     Lab Review:     Component Value Date/Time   NA 140 01/20/2020 0915   K 4.1 01/20/2020 0915   CL 106  01/20/2020 0915   CO2 27 01/20/2020 0915   GLUCOSE 84 01/20/2020 0915   BUN 10 01/20/2020 0915   CREATININE 0.68 01/20/2020 0915   CREATININE 0.67 11/26/2013 1604   CALCIUM 8.9 01/20/2020 0915   PROT 6.6 01/20/2020 0915   ALBUMIN 4.3 01/20/2020 0915   AST 13 01/20/2020  0915   ALT 9 01/20/2020 0915   ALKPHOS 52 01/20/2020 0915   BILITOT 0.4 01/20/2020 0915   GFRNONAA >60 07/31/2018 1414   GFRAA >60 07/31/2018 1414       Component Value Date/Time   WBC 8.9 01/20/2020 0915   RBC 4.37 01/20/2020 0915   HGB 14.0 01/20/2020 0915   HCT 41.9 01/20/2020 0915   PLT 298.0 01/20/2020 0915   MCV 95.9 01/20/2020 0915   MCV 94.4 07/14/2014 1524   MCH 30.4 07/31/2018 1414   MCHC 33.4 01/20/2020 0915   RDW 12.9 01/20/2020 0915   LYMPHSABS 1.7 01/20/2020 0915   MONOABS 0.6 01/20/2020 0915   EOSABS 0.2 01/20/2020 0915   BASOSABS 0.1 01/20/2020 0915    No results found for: POCLITH, LITHIUM   No results found for: PHENYTOIN, PHENOBARB, VALPROATE, CBMZ   .res Assessment: Plan:    Plan:  Xanax 0.25mg  daily PRN anxiety - uses occasionally  L-methylfolate   Working with therapist - Tami Ribas.   Patient does not want to trial any further medications.  Out of work currently - does not feel ready to return to work - currently on disability   Out of work 04/12/2020 to 06/14/2019 - will re-assess RTW date at next visit.  Multiple failures with SSRI, SNRI, and Wellbutrin - allergic reactions.  Discussed potential benefits, risk, and side effects of benzodiazepines to include potential risk of tolerance and dependence, as well as possible drowsiness.  Advised patient not to drive if experiencing drowsiness and to take lowest possible effective dose to minimize risk of dependence and tolerance.   Diagnoses and all orders for this visit:  Generalized anxiety disorder  Panic attacks -     ALPRAZolam (XANAX) 0.25 MG tablet; Take 1/2 to one tablet daily as needed for anxiety/panic attacks.  Attention deficit hyperactivity disorder (ADHD), unspecified ADHD type -     ALPRAZolam (XANAX) 0.25 MG tablet; Take 1/2 to one tablet daily as needed for anxiety/panic attacks.  Insomnia, unspecified type -     ALPRAZolam (XANAX) 0.25 MG tablet; Take 1/2 to one  tablet daily as needed for anxiety/panic attacks.  PTSD (post-traumatic stress disorder)     Please see After Visit Summary for patient specific instructions.  No future appointments.  No orders of the defined types were placed in this encounter.   -------------------------------

## 2020-06-14 ENCOUNTER — Telehealth: Payer: 59 | Admitting: Adult Health

## 2020-06-14 DIAGNOSIS — F411 Generalized anxiety disorder: Secondary | ICD-10-CM

## 2020-06-14 DIAGNOSIS — F4310 Post-traumatic stress disorder, unspecified: Secondary | ICD-10-CM

## 2020-06-14 DIAGNOSIS — G4700 Insomnia, unspecified: Secondary | ICD-10-CM

## 2020-06-14 DIAGNOSIS — F988 Other specified behavioral and emotional disorders with onset usually occurring in childhood and adolescence: Secondary | ICD-10-CM

## 2020-06-14 DIAGNOSIS — F410 Panic disorder [episodic paroxysmal anxiety] without agoraphobia: Secondary | ICD-10-CM

## 2020-06-14 DIAGNOSIS — F39 Unspecified mood [affective] disorder: Secondary | ICD-10-CM

## 2020-06-14 NOTE — Addendum Note (Signed)
Addended by: Dorothyann Gibbs on: 06/14/2020 09:14 AM   Modules accepted: Level of Service

## 2020-06-14 NOTE — Progress Notes (Signed)
No show phone visit. LVM to [facility].

## 2020-06-21 ENCOUNTER — Ambulatory Visit: Payer: 59 | Admitting: Physician Assistant

## 2020-06-21 DIAGNOSIS — Z0289 Encounter for other administrative examinations: Secondary | ICD-10-CM

## 2020-06-24 ENCOUNTER — Ambulatory Visit: Payer: 59 | Admitting: Family Medicine

## 2020-06-30 ENCOUNTER — Encounter: Payer: Self-pay | Admitting: Physician Assistant

## 2020-06-30 ENCOUNTER — Other Ambulatory Visit: Payer: Self-pay

## 2020-06-30 ENCOUNTER — Ambulatory Visit (INDEPENDENT_AMBULATORY_CARE_PROVIDER_SITE_OTHER): Payer: 59 | Admitting: Physician Assistant

## 2020-06-30 VITALS — BP 105/77 | HR 72 | Temp 98.7°F | Ht 66.0 in | Wt 157.0 lb

## 2020-06-30 DIAGNOSIS — M2550 Pain in unspecified joint: Secondary | ICD-10-CM | POA: Diagnosis not present

## 2020-06-30 DIAGNOSIS — N3091 Cystitis, unspecified with hematuria: Secondary | ICD-10-CM | POA: Diagnosis not present

## 2020-06-30 DIAGNOSIS — R233 Spontaneous ecchymoses: Secondary | ICD-10-CM

## 2020-06-30 DIAGNOSIS — R682 Dry mouth, unspecified: Secondary | ICD-10-CM

## 2020-06-30 DIAGNOSIS — F419 Anxiety disorder, unspecified: Secondary | ICD-10-CM

## 2020-06-30 DIAGNOSIS — R238 Other skin changes: Secondary | ICD-10-CM

## 2020-06-30 DIAGNOSIS — G8929 Other chronic pain: Secondary | ICD-10-CM

## 2020-06-30 DIAGNOSIS — R5383 Other fatigue: Secondary | ICD-10-CM

## 2020-06-30 DIAGNOSIS — F32A Anxiety disorder, unspecified: Secondary | ICD-10-CM

## 2020-06-30 LAB — POC URINALSYSI DIPSTICK (AUTOMATED)
Bilirubin, UA: NEGATIVE
Blood, UA: NEGATIVE
Glucose, UA: NEGATIVE
Ketones, UA: NEGATIVE
Leukocytes, UA: NEGATIVE
Nitrite, UA: NEGATIVE
Protein, UA: NEGATIVE
Spec Grav, UA: 1.02 (ref 1.010–1.025)
Urobilinogen, UA: 0.2 E.U./dL
pH, UA: 6 (ref 5.0–8.0)

## 2020-06-30 NOTE — Progress Notes (Signed)
Acute Office Visit  Subjective:    Patient ID: Abigail White, female    DOB: February 16, 1981, 40 y.o.   MRN: [id number]  Chief Complaint  Patient presents with  . Joint Pain    HPI Patient is in office with her husband today for:  1. Dysuria and urgency - started about 4-5 days ago. Urgent care yesterday said there was blood and protein, thought maybe she had a renal stone. Family hx of stones, but pt has never had one.   2. Joint pain - hands, fingers, ankles, feet. Sometimes hurts to walk or open jars and occasionally has swelling. Ibuprofen and Tylenol occasionally helps. Used to work as a Retail bankerdialysis nurse since age 40; came out in 2020 due to anxiety and depression. States she had lab work-up done a few years ago and no cause was found.  3. Anxiety and depression - follows with counseling. She has Rx for Xanax 0.25 mg that she states she rarely ever takes. She has numerous allergies to anti-depressants. She admits she does not like to see doctors and is very leery of germs.  Past Medical History:  Diagnosis Date  . ADHD (attention deficit hyperactivity disorder)   . Allergy   . Anemia   . Anxiety    H/O  . Carpal tunnel syndrome    bilateral  . Chest pain    H/O - no current problems - anxiety related  . Chronic fatigue   . Depression    Gestational  . Dyspnea   . GERD (gastroesophageal reflux disease)   . History of chickenpox   . HSV (herpes simplex virus) infection   . Hx of pyelonephritis   . Migraine   . Ovarian cyst   . Palpitations    H/O - no current problems-anxiety related  . PONV (postoperative nausea and vomiting)   . Postpartum care following vaginal delivery (2/13) 06/21/2016  . PTSD (post-traumatic stress disorder)   . Smoker   . Spontaneous vaginal delivery 06/21/2016   svd x 3  . Vaginal Pap smear, abnormal     Past Surgical History:  Procedure Laterality Date  . LEEP  2005, 2013   x 2  . NASAL SINUS SURGERY  1999  . WISDOM TOOTH EXTRACTION       Family History  Problem Relation Age of Onset  . Coronary artery disease Mother 3555  . Hypertension Mother   . Diabetes Mother   . Heart disease Mother   . Kidney Stones Mother   . Asthma Mother   . Kidney disease Mother   . Hyperthyroidism Mother   . COPD Mother   . Heart attack Mother   . Dementia Mother   . Other Father        Shot-violence  . Alcohol abuse Father   . Hypertension Brother   . Kidney Stones Brother   . Hyperlipidemia Brother   . COPD Brother   . Heart attack Brother   . Diabetes Brother   . Hypertension Brother   . Hypertension Maternal Grandmother   . Hyperlipidemia Maternal Grandmother   . Cancer Maternal Grandmother        Skin  . COPD Maternal Grandmother   . Diabetes Maternal Grandmother   . Cancer Maternal Grandfather        Bone  . Cancer Paternal Grandmother   . Stroke Paternal Grandmother   . Aneurysm Paternal Grandfather   . Hypertension Other   . Diabetes Other   . Asthma Son   .  Hypertension Maternal Aunt   . COPD Maternal Aunt   . Diabetes Maternal Aunt   . Heart attack Maternal Aunt   . Cancer Paternal Aunt        Breast    Social History   Socioeconomic History  . Marital status: Married    Spouse name: Not on file  . Number of children: 3  . Years of education: 4  . Highest education level: Not on file  Occupational History  . Occupation: nusre     Employer: OTHER  Tobacco Use  . Smoking status: Current Every Day Smoker    Packs/day: 1.00    Years: 20.00    Pack years: 20.00    Types: Cigarettes  . Smokeless tobacco: Never Used  Vaping Use  . Vaping Use: Every day  Substance and Sexual Activity  . Alcohol use: Yes  . Drug use: No  . Sexual activity: Yes  Other Topics Concern  . Not on file  Social History Narrative  . Not on file   Social Determinants of Health   Financial Resource Strain: Not on file  Food Insecurity: Not on file  Transportation Needs: Not on file  Physical Activity: Not on file   Stress: Not on file  Social Connections: Not on file  Intimate Partner Violence: Not on file    Outpatient Medications Prior to Visit  Medication Sig Dispense Refill  . albuterol (PROAIR HFA) 108 (90 Base) MCG/ACT inhaler Inhale 2 puffs into the lungs every 6 (six) hours as needed. 18 g 2  . ALPRAZolam (XANAX) 0.25 MG tablet Take 1/2 to one tablet daily as needed for anxiety/panic attacks. 30 tablet 2  . fluticasone (FLOVENT HFA) 110 MCG/ACT inhaler TAKE 2 PUFFS BY MOUTH TWICE A DAY 12 g 2  . ibuprofen (ADVIL) 800 MG tablet Take 1 tablet (800 mg total) by mouth every 8 (eight) hours as needed for cramping. 90 tablet 0  . ondansetron (ZOFRAN) 4 MG tablet Take 1 tablet (4 mg total) by mouth every 8 (eight) hours as needed for nausea or vomiting. 20 tablet 0  . valACYclovir (VALTREX) 500 MG tablet Take 1 tablet as needed for outbreak    . cyclobenzaprine (FLEXERIL) 10 MG tablet Take 10 mg by mouth 3 (three) times daily.     No facility-administered medications prior to visit.    Allergies  Allergen Reactions  . Effexor [Venlafaxine]     Multiple somatic issues  . Lexapro [Escitalopram Oxalate]     Mania   . Paxil [Paroxetine Hcl]     Feels detached   . Penicillins Hives    Has patient had a PCN reaction causing immediate rash, facial/tongue/throat swelling, SOB or lightheadedness with hypotension: no Has patient had a PCN reaction causing severe rash involving mucus membranes or skin necrosis: yes Has patient had a PCN reaction that required hospitalization no Has patient had a PCN reaction occurring within the last 10 years: no If all of the above answers are "NO", then may proceed with Cephalosporin use.   . Sulfa Antibiotics   . Vicodin [Hydrocodone-Acetaminophen] Nausea And Vomiting    Projectile vomiting  . Wellbutrin [Bupropion]     Became belligerant  . Zoloft [Sertraline Hcl] Hives    rash  . Differin [Adapalene] Rash  . Lamictal [Lamotrigine] Rash  . Latex Rash     Review of Systems  Constitutional: Negative for fever.  Respiratory: Negative for shortness of breath.   Cardiovascular: Negative for chest pain.  Gastrointestinal: Negative  for abdominal pain, constipation, diarrhea and nausea.  Genitourinary: Positive for dysuria, hematuria and urgency.  Musculoskeletal: Positive for arthralgias, joint swelling and myalgias. Negative for back pain and gait problem.  Psychiatric/Behavioral: Positive for dysphoric mood. The patient is nervous/anxious.        Objective:    Physical Exam Vitals and nursing note reviewed.  Constitutional:      Appearance: Normal appearance. She is normal weight. She is not toxic-appearing.  HENT:     Head: Normocephalic and atraumatic.     Right Ear: Tympanic membrane, ear canal and external ear normal.     Left Ear: Tympanic membrane, ear canal and external ear normal.     Nose: Nose normal.     Mouth/Throat:     Mouth: Mucous membranes are moist.  Eyes:     Extraocular Movements: Extraocular movements intact.     Conjunctiva/sclera: Conjunctivae normal.     Pupils: Pupils are equal, round, and reactive to light.  Cardiovascular:     Rate and Rhythm: Normal rate and regular rhythm.     Pulses: Normal pulses.     Heart sounds: Normal heart sounds.  Pulmonary:     Effort: Pulmonary effort is normal.     Breath sounds: Normal breath sounds.  Abdominal:     General: Abdomen is flat. Bowel sounds are normal.     Palpations: Abdomen is soft.     Tenderness: There is no right CVA tenderness or left CVA tenderness.  Musculoskeletal:        General: Tenderness (Right index MCP joint especially painful) present. Normal range of motion.     Cervical back: Normal range of motion and neck supple.  Skin:    General: Skin is warm and dry.  Neurological:     General: No focal deficit present.     Mental Status: She is alert and oriented to person, place, and time.  Psychiatric:        Mood and Affect: Mood normal.         Behavior: Behavior normal.        Thought Content: Thought content normal.        Judgment: Judgment normal.     BP 105/77   Pulse 72   Temp 98.7 F (37.1 C)   Ht 5\' 6"  (1.676 m)   Wt 157 lb (71.2 kg)   SpO2 97%   BMI 25.34 kg/m  Wt Readings from Last 3 Encounters:  06/30/20 157 lb (71.2 kg)  01/20/20 157 lb (71.2 kg)  12/13/18 154 lb (69.9 kg)       Assessment & Plan:   Problem List Items Addressed This Visit      Other   Anxiety and depression    Other Visit Diagnoses    Chronic pain of multiple joints    -  Primary   Relevant Medications   cyclobenzaprine (FLEXERIL) 10 MG tablet   Other Relevant Orders   CBC with Differential/Platelet   Comprehensive metabolic panel   TSH   ANA   Sedimentation rate   Rheumatoid factor   Vitamin B12   Uric acid   Lyme Ab/Western Blot Reflex   VITAMIN D 25 Hydroxy (Vit-D Deficiency, Fractures)   Sjogrens syndrome-A extractable nuclear antibody   Sjogrens syndrome-B extractable nuclear antibody   Other fatigue       Relevant Orders   TSH   Dry mouth       Relevant Orders   Sjogrens syndrome-A extractable nuclear antibody  Sjogrens syndrome-B extractable nuclear antibody   Easy bruising       Relevant Orders   CBC with Differential/Platelet   Hematuria due to cystitis       Relevant Orders   POCT Urinalysis Dipstick (Automated) (Completed)   Urine Culture   Comprehensive metabolic panel      1. Chronic pain of multiple joints 2. Other fatigue 3. Dry mouth Will recheck labs today and treat accordingly pending results. Of course healthy diet and increase of water encouraged. Exercise / yoga may help. She can try Tumeric addition to her foods. Tylenol or Ibuprofen as needed. May consider Meloxicam daily if her CMET is WNL.   4. Anxiety and depression Continue regular f/up with counseling. She will let me know how she is doing.  5. Easy bruising Check CBC today.  6. Hematuria due to cystitis Urinalysis  is clear today. Will culture urine. Cont to push fluids. Call if worse.  This visit occurred during the SARS-CoV-2 public health emergency.  Safety protocols were in place, including screening questions prior to the visit, additional usage of staff PPE, and extensive cleaning of exam room while observing appropriate contact time as indicated for disinfecting solutions.   Total time spent with patient obtaining history, urine report, and reviewing prior records and labs: 54 minutes.  Alyssa M Allwardt, PA-C

## 2020-06-30 NOTE — Patient Instructions (Signed)
Please go to the lab for blood work. I will send results through MyChart and we can discuss treatment accordingly.

## 2020-07-01 LAB — COMPREHENSIVE METABOLIC PANEL
ALT: 16 U/L (ref 0–35)
AST: 18 U/L (ref 0–37)
Albumin: 4.4 g/dL (ref 3.5–5.2)
Alkaline Phosphatase: 48 U/L (ref 39–117)
BUN: 13 mg/dL (ref 6–23)
CO2: 25 mEq/L (ref 19–32)
Calcium: 9.8 mg/dL (ref 8.4–10.5)
Chloride: 106 mEq/L (ref 96–112)
Creatinine, Ser: 0.72 mg/dL (ref 0.40–1.20)
GFR: 105.38 mL/min (ref >60.00)
Glucose, Bld: 79 mg/dL (ref 70–99)
Potassium: 4.6 mEq/L (ref 3.5–5.1)
Sodium: 138 mEq/L (ref 135–145)
Total Bilirubin: 0.4 mg/dL (ref 0.2–1.2)
Total Protein: 7.3 g/dL (ref 6.0–8.3)

## 2020-07-01 LAB — CBC WITH DIFFERENTIAL/PLATELET
Basophils Absolute: 0.1 10*3/uL (ref 0.0–0.1)
Basophils Relative: 0.7 % (ref 0.0–3.0)
Eosinophils Absolute: 0.1 10*3/uL (ref 0.0–0.7)
Eosinophils Relative: 1 % (ref 0.0–5.0)
HCT: 43.7 % (ref 36.0–46.0)
Hemoglobin: 14.8 g/dL (ref 12.0–15.0)
Lymphocytes Relative: 13.9 % (ref 12.0–46.0)
Lymphs Abs: 1.9 10*3/uL (ref 0.7–4.0)
MCHC: 33.8 g/dL (ref 30.0–36.0)
MCV: 94.7 fl (ref 78.0–100.0)
Monocytes Absolute: 1 10*3/uL (ref 0.1–1.0)
Monocytes Relative: 7 % (ref 3.0–12.0)
Neutro Abs: 10.5 10*3/uL — ABNORMAL HIGH (ref 1.4–7.7)
Neutrophils Relative %: 77.4 % — ABNORMAL HIGH (ref 43.0–77.0)
Platelets: 288 10*3/uL (ref 150.0–400.0)
RBC: 4.61 Mil/uL (ref 3.87–5.11)
RDW: 13.6 % (ref 11.5–15.5)
WBC: 13.6 10*3/uL — ABNORMAL HIGH (ref 4.0–10.5)

## 2020-07-01 LAB — SEDIMENTATION RATE: Sed Rate: 9 mm/hr (ref 0–20)

## 2020-07-01 LAB — URINE CULTURE
MICRO NUMBER:: 11569764
Result:: NO GROWTH
SPECIMEN QUALITY:: ADEQUATE

## 2020-07-01 LAB — TSH: TSH: 0.79 u[IU]/mL (ref 0.35–4.50)

## 2020-07-01 LAB — VITAMIN D 25 HYDROXY (VIT D DEFICIENCY, FRACTURES): VITD: 22.96 ng/mL — ABNORMAL LOW (ref 30.00–100.00)

## 2020-07-01 LAB — URIC ACID: Uric Acid, Serum: 3.5 mg/dL (ref 2.4–7.0)

## 2020-07-01 LAB — VITAMIN B12: Vitamin B-12: 248 pg/mL (ref 211–911)

## 2020-07-02 LAB — ANTI-NUCLEAR AB-TITER (ANA TITER): ANA Titer 1: 1:40 {titer} — ABNORMAL HIGH

## 2020-07-02 LAB — ANA: Anti Nuclear Antibody (ANA): POSITIVE — AB

## 2020-07-02 LAB — RHEUMATOID FACTOR: Rheumatoid fact SerPl-aCnc: <14 IU/mL (ref <14)

## 2020-07-02 LAB — LYME AB/WESTERN BLOT REFLEX
LYME DISEASE AB, QUANT, IGM: <0.8 index (ref 0.00–0.79)
Lyme IgG/IgM Ab: <0.91 {ISR} (ref 0.00–0.90)

## 2020-07-02 LAB — SJOGRENS SYNDROME-B EXTRACTABLE NUCLEAR ANTIBODY: SSB (La) (ENA) Antibody, IgG: <1 AI

## 2020-07-02 LAB — SJOGRENS SYNDROME-A EXTRACTABLE NUCLEAR ANTIBODY: SSA (Ro) (ENA) Antibody, IgG: <1 AI

## 2020-07-05 ENCOUNTER — Other Ambulatory Visit: Payer: Self-pay

## 2020-07-05 DIAGNOSIS — G8929 Other chronic pain: Secondary | ICD-10-CM

## 2020-07-05 DIAGNOSIS — M2550 Pain in unspecified joint: Secondary | ICD-10-CM

## 2020-07-06 ENCOUNTER — Encounter: Payer: Self-pay | Admitting: Adult Health

## 2020-07-06 ENCOUNTER — Telehealth (INDEPENDENT_AMBULATORY_CARE_PROVIDER_SITE_OTHER): Payer: 59 | Admitting: Adult Health

## 2020-07-06 DIAGNOSIS — F410 Panic disorder [episodic paroxysmal anxiety] without agoraphobia: Secondary | ICD-10-CM | POA: Diagnosis not present

## 2020-07-06 DIAGNOSIS — F411 Generalized anxiety disorder: Secondary | ICD-10-CM

## 2020-07-06 DIAGNOSIS — G4700 Insomnia, unspecified: Secondary | ICD-10-CM | POA: Diagnosis not present

## 2020-07-06 DIAGNOSIS — F909 Attention-deficit hyperactivity disorder, unspecified type: Secondary | ICD-10-CM | POA: Diagnosis not present

## 2020-07-06 DIAGNOSIS — F4310 Post-traumatic stress disorder, unspecified: Secondary | ICD-10-CM

## 2020-07-06 MED ORDER — ALPRAZOLAM 0.25 MG PO TABS
ORAL_TABLET | ORAL | 2 refills | Status: DC
Start: 2020-07-06 — End: 2020-12-03

## 2020-07-06 NOTE — Progress Notes (Signed)
Abigail White [id number] Feb 03, 1981 40 y.o.  Virtual Visit via Telephone Note  I connected with pt on 07/06/20 at 10:40 AM EST by telephone and verified that I am speaking with the correct person using two identifiers.   I discussed the limitations, risks, security and privacy concerns of performing an evaluation and management service by telephone and the availability of in person appointments. I also discussed with the patient that there may be a patient responsible charge related to this service. The patient expressed understanding and agreed to proceed.   I discussed the assessment and treatment plan with the patient. The patient was provided an opportunity to ask questions and all were answered. The patient agreed with the plan and demonstrated an understanding of the instructions.   The patient was advised to call back or seek an in-person evaluation if the symptoms worsen or if the condition fails to improve as anticipated.  I provided 30 minutes of non-face-to-face time during this encounter.  The patient was located at home.  The provider was located at [facility].   Abigail Gibbs, NP   Subjective:   Patient ID:  Abigail White is a 40 y.o. (DOB April 16, 1981) female.  Chief Complaint: No chief complaint on file.   HPI RAVINDER HOFLAND presents for follow-up of GAD, MDD, ADHD, insomnia, panic attacks, PTSD.  Describes mood today as "not good". Tearful at times. Pleasant. Mood symptoms - reports depression, anxiety, and irritability. Reports panic attacks. Stating "symptoms more severe with increased pain". Getting overwhelmed at times.  Feels like she is "failing" children. Struggling financially. Chronic pain issues. Receiving disability benefits through previous employer. Varying interest and motivation. Taking medications as prescribed. Seeing therapist weekly - Isiah Collins. Energy levels low. Active, does not have a regular exercise routine.  Enjoys  some usual interests and activities. Married. Lives with husband and 3 childen 68 y/o son, 62 year old son, and 51 y/o daughter. Extended family local. Appetite adequate. Weight stable 149 to 153 pounds.  Sleeping difficulties. Averages 3 to 5 hours - sometimes less. Focus and concentration difficulties. Completing tasks. Managing some aspects of household. Out of work currently - does not feel she is ready to return to work setting. Denies SI or HI.  Denies AH or VH.  Review of Systems:  Review of Systems  Musculoskeletal: Negative for gait problem.  Neurological: Negative for tremors.  Psychiatric/Behavioral:       Please refer to HPI    Medications: I have reviewed the patient's current medications.  Current Outpatient Medications  Medication Sig Dispense Refill  . albuterol (PROAIR HFA) 108 (90 Base) MCG/ACT inhaler Inhale 2 puffs into the lungs every 6 (six) hours as needed. 18 g 2  . ALPRAZolam (XANAX) 0.25 MG tablet Take 1/2 to one tablet daily as needed for anxiety/panic attacks. 30 tablet 2  . cyclobenzaprine (FLEXERIL) 10 MG tablet Take 10 mg by mouth 3 (three) times daily.    . fluticasone (FLOVENT HFA) 110 MCG/ACT inhaler TAKE 2 PUFFS BY MOUTH TWICE A DAY 12 g 2  . ibuprofen (ADVIL) 800 MG tablet Take 1 tablet (800 mg total) by mouth every 8 (eight) hours as needed for cramping. 90 tablet 0  . ondansetron (ZOFRAN) 4 MG tablet Take 1 tablet (4 mg total) by mouth every 8 (eight) hours as needed for nausea or vomiting. 20 tablet 0  . valACYclovir (VALTREX) 500 MG tablet Take 1 tablet as needed for outbreak     No current  facility-administered medications for this visit.    Medication Side Effects: None  Allergies:  Allergies  Allergen Reactions  . Effexor [Venlafaxine]     Multiple somatic issues  . Lexapro [Escitalopram Oxalate]     Mania   . Paxil [Paroxetine Hcl]     Feels detached   . Penicillins Hives    Has patient had a PCN reaction causing immediate rash,  facial/tongue/throat swelling, SOB or lightheadedness with hypotension: no Has patient had a PCN reaction causing severe rash involving mucus membranes or skin necrosis: yes Has patient had a PCN reaction that required hospitalization no Has patient had a PCN reaction occurring within the last 10 years: no If all of the above answers are "NO", then may proceed with Cephalosporin use.   . Sulfa Antibiotics   . Vicodin [Hydrocodone-Acetaminophen] Nausea And Vomiting    Projectile vomiting  . Wellbutrin [Bupropion]     Became belligerant  . Zoloft [Sertraline Hcl] Hives    rash  . Differin [Adapalene] Rash  . Lamictal [Lamotrigine] Rash  . Latex Rash    Past Medical History:  Diagnosis Date  . ADHD (attention deficit hyperactivity disorder)   . Allergy   . Anemia   . Anxiety    H/O  . Carpal tunnel syndrome    bilateral  . Chest pain    H/O - no current problems - anxiety related  . Chronic fatigue   . Depression    Gestational  . Dyspnea   . GERD (gastroesophageal reflux disease)   . History of chickenpox   . HSV (herpes simplex virus) infection   . Hx of pyelonephritis   . Migraine   . Ovarian cyst   . Palpitations    H/O - no current problems-anxiety related  . PONV (postoperative nausea and vomiting)   . Postpartum care following vaginal delivery (2/13) 06/21/2016  . PTSD (post-traumatic stress disorder)   . Smoker   . Spontaneous vaginal delivery 06/21/2016   svd x 3  . Vaginal Pap smear, abnormal     Family History  Problem Relation Age of Onset  . Coronary artery disease Mother [id number]  . Hypertension Mother   . Diabetes Mother   . Heart disease Mother   . Kidney Stones Mother   . Asthma Mother   . Kidney disease Mother   . Hyperthyroidism Mother   . COPD Mother   . Heart attack Mother   . Dementia Mother   . Other Father        Shot-violence  . Alcohol abuse Father   . Hypertension Brother   . Kidney Stones Brother   . Hyperlipidemia Brother   .  COPD Brother   . Heart attack Brother   . Diabetes Brother   . Hypertension Brother   . Hypertension Maternal Grandmother   . Hyperlipidemia Maternal Grandmother   . Cancer Maternal Grandmother        Skin  . COPD Maternal Grandmother   . Diabetes Maternal Grandmother   . Cancer Maternal Grandfather        Bone  . Cancer Paternal Grandmother   . Stroke Paternal Grandmother   . Aneurysm Paternal Grandfather   . Hypertension Other   . Diabetes Other   . Asthma Son   . Hypertension Maternal Aunt   . COPD Maternal Aunt   . Diabetes Maternal Aunt   . Heart attack Maternal Aunt   . Cancer Paternal Aunt        Breast  Social History   Socioeconomic History  . Marital status: Married    Spouse name: Not on file  . Number of children: 3  . Years of education: 4  . Highest education level: Not on file  Occupational History  . Occupation: nusre     Employer: OTHER  Tobacco Use  . Smoking status: Current Every Day Smoker    Packs/day: 1.00    Years: 20.00    Pack years: 20.00    Types: Cigarettes  . Smokeless tobacco: Never Used  Vaping Use  . Vaping Use: Every day  Substance and Sexual Activity  . Alcohol use: Yes  . Drug use: No  . Sexual activity: Yes  Other Topics Concern  . Not on file  Social History Narrative  . Not on file   Social Determinants of Health   Financial Resource Strain: Not on file  Food Insecurity: Not on file  Transportation Needs: Not on file  Physical Activity: Not on file  Stress: Not on file  Social Connections: Not on file  Intimate Partner Violence: Not on file    Past Medical History, Surgical history, Social history, and Family history were reviewed and updated as appropriate.   Please see review of systems for further details on the patient's review from today.   Objective:   Physical Exam:  There were no vitals taken for this visit.  Physical Exam Constitutional:      General: She is not in acute  distress. Musculoskeletal:        General: No deformity.  Neurological:     Mental Status: She is alert and oriented to person, place, and time.     Coordination: Coordination normal.  Psychiatric:        Attention and Perception: Attention and perception normal. She does not perceive auditory or visual hallucinations.        Mood and Affect: Mood normal. Mood is not anxious or depressed. Affect is not labile, blunt, angry or inappropriate.        Speech: Speech normal.        Behavior: Behavior normal.        Thought Content: Thought content normal. Thought content is not paranoid or delusional. Thought content does not include homicidal or suicidal ideation. Thought content does not include homicidal or suicidal plan.        Cognition and Memory: Cognition and memory normal.        Judgment: Judgment normal.     Comments: Insight intact     Lab Review:     Component Value Date/Time   NA 138 06/30/2020 1516   K 4.6 06/30/2020 1516   CL 106 06/30/2020 1516   CO2 25 06/30/2020 1516   GLUCOSE 79 06/30/2020 1516   BUN 13 06/30/2020 1516   CREATININE 0.72 06/30/2020 1516   CREATININE 0.67 11/26/2013 1604   CALCIUM 9.8 06/30/2020 1516   PROT 7.3 06/30/2020 1516   ALBUMIN 4.4 06/30/2020 1516   AST 18 06/30/2020 1516   ALT 16 06/30/2020 1516   ALKPHOS 48 06/30/2020 1516   BILITOT 0.4 06/30/2020 1516   GFRNONAA >60 07/31/2018 1414   GFRAA >60 07/31/2018 1414       Component Value Date/Time   WBC 13.6 (H) 06/30/2020 1516   RBC 4.61 06/30/2020 1516   HGB 14.8 06/30/2020 1516   HCT 43.7 06/30/2020 1516   PLT 288.0 06/30/2020 1516   MCV 94.7 06/30/2020 1516   MCV 94.4 07/14/2014 1524   MCH 30.4  07/31/2018 1414   MCHC 33.8 06/30/2020 1516   RDW 13.6 06/30/2020 1516   LYMPHSABS 1.9 06/30/2020 1516   MONOABS 1.0 06/30/2020 1516   EOSABS 0.1 06/30/2020 1516   BASOSABS 0.1 06/30/2020 1516    No results found for: POCLITH, LITHIUM   No results found for: PHENYTOIN,  PHENOBARB, VALPROATE, CBMZ   .res Assessment: Plan:     Plan:  Xanax 0.25mg  daily PRN anxiety - uses occasionally  L-methylfolate   RTC 4 weeks  Working with therapist - Tami Ribas.   Patient does not want to trial any further medications - "scared of them".  Out of work currently - does not feel ready to return to work - currently on STD.   Out of work 04/12/2020 to 08/07/2019 - will re-assess RTW date at next visit.  Multiple failures with SSRI, SNRI, and Wellbutrin - allergic reactions.  Discussed potential benefits, risk, and side effects of benzodiazepines to include potential risk of tolerance and dependence, as well as possible drowsiness.  Advised patient not to drive if experiencing drowsiness and to take lowest possible effective dose to minimize risk of dependence and tolerance.  Discussed potential benefits, risks, and side effects of stimulants with patient to include increased heart rate, palpitations, insomnia, increased anxiety, increased irritability, or decreased appetite.  Instructed patient to contact office if experiencing any significant tolerability issues.    Diagnoses and all orders for this visit:  Attention deficit hyperactivity disorder (ADHD), unspecified ADHD type  PTSD (post-traumatic stress disorder)  Insomnia, unspecified type  Panic attacks  Generalized anxiety disorder    Please see After Visit Summary for patient specific instructions.  No future appointments.  No orders of the defined types were placed in this encounter.     -------------------------------
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
## 2020-09-07 ENCOUNTER — Ambulatory Visit: Payer: Self-pay

## 2020-09-07 ENCOUNTER — Encounter: Payer: Self-pay | Admitting: Rheumatology

## 2020-09-07 ENCOUNTER — Ambulatory Visit: Payer: 59 | Admitting: Rheumatology

## 2020-09-07 ENCOUNTER — Other Ambulatory Visit: Payer: Self-pay

## 2020-09-07 VITALS — BP 106/69 | HR 92 | Resp 14 | Ht 66.75 in | Wt 162.6 lb

## 2020-09-07 DIAGNOSIS — M79672 Pain in left foot: Secondary | ICD-10-CM

## 2020-09-07 DIAGNOSIS — M79642 Pain in left hand: Secondary | ICD-10-CM | POA: Diagnosis not present

## 2020-09-07 DIAGNOSIS — M79641 Pain in right hand: Secondary | ICD-10-CM | POA: Diagnosis not present

## 2020-09-07 DIAGNOSIS — F32A Anxiety disorder, unspecified: Secondary | ICD-10-CM

## 2020-09-07 DIAGNOSIS — M545 Low back pain, unspecified: Secondary | ICD-10-CM

## 2020-09-07 DIAGNOSIS — F5101 Primary insomnia: Secondary | ICD-10-CM

## 2020-09-07 DIAGNOSIS — R768 Other specified abnormal immunological findings in serum: Secondary | ICD-10-CM

## 2020-09-07 DIAGNOSIS — M5459 Other low back pain: Secondary | ICD-10-CM

## 2020-09-07 DIAGNOSIS — F988 Other specified behavioral and emotional disorders with onset usually occurring in childhood and adolescence: Secondary | ICD-10-CM

## 2020-09-07 DIAGNOSIS — M2550 Pain in unspecified joint: Secondary | ICD-10-CM | POA: Diagnosis not present

## 2020-09-07 DIAGNOSIS — M79671 Pain in right foot: Secondary | ICD-10-CM | POA: Diagnosis not present

## 2020-09-07 DIAGNOSIS — G8929 Other chronic pain: Secondary | ICD-10-CM | POA: Diagnosis not present

## 2020-09-07 DIAGNOSIS — F419 Anxiety disorder, unspecified: Secondary | ICD-10-CM

## 2020-09-07 DIAGNOSIS — R5383 Other fatigue: Secondary | ICD-10-CM

## 2020-09-07 DIAGNOSIS — E559 Vitamin D deficiency, unspecified: Secondary | ICD-10-CM

## 2020-09-07 DIAGNOSIS — J302 Other seasonal allergic rhinitis: Secondary | ICD-10-CM

## 2020-09-07 DIAGNOSIS — M797 Fibromyalgia: Secondary | ICD-10-CM

## 2020-09-07 DIAGNOSIS — G43909 Migraine, unspecified, not intractable, without status migrainosus: Secondary | ICD-10-CM

## 2020-09-07 DIAGNOSIS — E010 Iodine-deficiency related diffuse (endemic) goiter: Secondary | ICD-10-CM

## 2020-09-07 NOTE — Patient Instructions (Signed)

## 2020-09-08 ENCOUNTER — Encounter: Payer: Self-pay | Admitting: Rheumatology

## 2020-09-09 LAB — PROTEIN ELECTROPHORESIS, SERUM, WITH REFLEX
Albumin ELP: 4 g/dL (ref 3.8–4.8)
Alpha 1: 0.3 g/dL (ref 0.2–0.3)
Alpha 2: 0.6 g/dL (ref 0.5–0.9)
Beta 2: 0.3 g/dL (ref 0.2–0.5)
Beta Globulin: 0.4 g/dL (ref 0.4–0.6)
Gamma Globulin: 0.8 g/dL (ref 0.8–1.7)
Total Protein: 6.3 g/dL (ref 6.1–8.1)

## 2020-09-09 LAB — CK: Total CK: 90 U/L (ref 29–143)

## 2020-09-09 LAB — VITAMIN D 25 HYDROXY (VIT D DEFICIENCY, FRACTURES): Vit D, 25-Hydroxy: 33 ng/mL (ref 30–100)

## 2020-09-16 ENCOUNTER — Telehealth: Payer: Self-pay | Admitting: Adult Health

## 2020-09-16 NOTE — Telephone Encounter (Signed)
LTD for received placed on Traci's desk 09/16/20

## 2020-09-17 NOTE — Telephone Encounter (Signed)
Noted will be reviewed

## 2020-09-20 NOTE — Telephone Encounter (Signed)
Noted  

## 2020-09-20 NOTE — Telephone Encounter (Signed)
Received request for her records to be sent to [facility] and also asking if she has any restrictions or limitations.  I will hold pw until after her apt tomorrow 09/21/2020 to add any changes as well as sending updated records.

## 2020-09-21 ENCOUNTER — Telehealth (INDEPENDENT_AMBULATORY_CARE_PROVIDER_SITE_OTHER): Payer: 59 | Admitting: Adult Health

## 2020-09-21 DIAGNOSIS — F410 Panic disorder [episodic paroxysmal anxiety] without agoraphobia: Secondary | ICD-10-CM

## 2020-09-21 DIAGNOSIS — F4310 Post-traumatic stress disorder, unspecified: Secondary | ICD-10-CM

## 2020-09-21 DIAGNOSIS — G4700 Insomnia, unspecified: Secondary | ICD-10-CM | POA: Diagnosis not present

## 2020-09-21 DIAGNOSIS — F411 Generalized anxiety disorder: Secondary | ICD-10-CM | POA: Diagnosis not present

## 2020-09-21 DIAGNOSIS — F909 Attention-deficit hyperactivity disorder, unspecified type: Secondary | ICD-10-CM

## 2020-09-21 MED ORDER — DULOXETINE HCL 20 MG PO CPEP: 20.0000 mg | ORAL_CAPSULE | Freq: Every day | ORAL | 2 refills | Status: DC

## 2020-09-21 NOTE — Progress Notes (Signed)
Abigail White [id number] 1981/04/11 40 y.o.  Virtual Visit via Telephone Note  I connected with pt on 09/21/20 at  9:40 AM EDT by telephone and verified that I am speaking with the correct person using two identifiers.   I discussed the limitations, risks, security and privacy concerns of performing an evaluation and management service by telephone and the availability of in person appointments. I also discussed with the patient that there may be a patient responsible charge related to this service. The patient expressed understanding and agreed to proceed.   I discussed the assessment and treatment plan with the patient. The patient was provided an opportunity to ask questions and all were answered. The patient agreed with the plan and demonstrated an understanding of the instructions.   The patient was advised to call back or seek an in-person evaluation if the symptoms worsen or if the condition fails to improve as anticipated.  I provided 30 minutes of non-face-to-face time during this encounter.  The patient was located at home.  The provider was located at [facility].   Dorothyann Gibbs, NP   Subjective:   Patient ID:  Abigail White is a 40 y.o. (DOB 1980/07/08) female.  Chief Complaint: No chief complaint on file.   HPI JARROD BODKINS presents for follow-up of GAD, MDD, ADHD, insomnia, panic attacks, PTSD.  Describes mood today as "about the same". Stating "I don't want to feel like this forever". Tearful at times. Pleasant. Mood symptoms - reports depression, anxiety, and irritability. Reports panic attacks - moments of extreme panic. Energy levels lower - "simple tasks are difficult". Feels tired and fatigued all the time - "even the smallest tasks wear me out". Stating "I'm barely hanging on". Referred to a rheumatologist - may have Fibromyalgia. Physical therapy and Cymbalta was suggested to help manage symptoms. Stating "I'm scared to try anything".  Concerned about trying a new medication. Reports getting "really" sick a few weeks ago - Pneumonia and bronchitis - started on steroids. Stating the steroids helped with my pain. Stating "I feel so tired, I can't even explain it to you". Getting kids to school late - "if at all". Feels like a "crappy parent". Stating "I know I need to do things, but I can't make myself". Struggling financially. Receiving long term disability benefits through previous employer. Varying interest and motivation. Taking medications as prescribed. Seeing therapist weekly - Isiah Collins. Energy levels low. Active, does not have a regular exercise routine.  Enjoys some usual interests and activities. Married. Lives with husband and 3 childen 10 y/o son, 32 year old son, and 42 y/o daughter. Extended family local. Appetite adequate. Weight gain - 160 pounds.  Sleeping difficulties. Averages 3 to 5 hours - sometimes less. Focus and concentration difficulties. Completing tasks. Managing some aspects of household. Out of work currently - does not feel she is ready to return to work setting. Denies SI or HI.  Denies AH or VH.    Review of Systems:  Review of Systems  Musculoskeletal: Negative for gait problem.  Neurological: Negative for tremors.  Psychiatric/Behavioral:       Please refer to HPI    Medications: I have reviewed the patient's current medications.  Current Outpatient Medications  Medication Sig Dispense Refill  . DULoxetine (CYMBALTA) 20 MG capsule Take 1 capsule (20 mg total) by mouth daily. 30 capsule 2  . Acetaminophen (TYLENOL PO) Take by mouth as needed.    Marland Kitchen albuterol (PROAIR HFA) 108 (90 Base)  MCG/ACT inhaler Inhale 2 puffs into the lungs every 6 (six) hours as needed. 18 g 2  . ALPRAZolam (XANAX) 0.25 MG tablet Take 1/2 to one tablet daily as needed for anxiety/panic attacks. 30 tablet 2  . butalbital-acetaminophen-caffeine (FIORICET) 50-325-40 MG tablet Take by mouth 2 (two) times daily as  needed for headache.    . cyclobenzaprine (FLEXERIL) 10 MG tablet Take 10 mg by mouth as needed.    . fluticasone (FLOVENT HFA) 110 MCG/ACT inhaler TAKE 2 PUFFS BY MOUTH TWICE A DAY 12 g 2  . ibuprofen (ADVIL) 800 MG tablet Take 1 tablet (800 mg total) by mouth every 8 (eight) hours as needed for cramping. 90 tablet 0  . ondansetron (ZOFRAN) 4 MG tablet Take 1 tablet (4 mg total) by mouth every 8 (eight) hours as needed for nausea or vomiting. 20 tablet 0  . valACYclovir (VALTREX) 500 MG tablet Take 1 tablet as needed for outbreak     No current facility-administered medications for this visit.    Medication Side Effects: None  Allergies:  Allergies  Allergen Reactions  . Effexor [Venlafaxine]     Multiple somatic issues  . Lexapro [Escitalopram Oxalate]     Mania   . Paxil [Paroxetine Hcl]     Feels detached   . Penicillins Hives    Has patient had a PCN reaction causing immediate rash, facial/tongue/throat swelling, SOB or lightheadedness with hypotension: no Has patient had a PCN reaction causing severe rash involving mucus membranes or skin necrosis: yes Has patient had a PCN reaction that required hospitalization no Has patient had a PCN reaction occurring within the last 10 years: no If all of the above answers are "NO", then may proceed with Cephalosporin use.   . Sulfa Antibiotics   . Vicodin [Hydrocodone-Acetaminophen] Nausea And Vomiting    Projectile vomiting  . Wellbutrin [Bupropion]     Became belligerant  . Zoloft [Sertraline Hcl] Hives    rash  . Differin [Adapalene] Rash  . Lamictal [Lamotrigine] Rash  . Latex Rash    Past Medical History:  Diagnosis Date  . ADHD (attention deficit hyperactivity disorder)   . Allergy   . Anemia   . Anxiety    H/O  . Carpal tunnel syndrome    bilateral  . Chest pain    H/O - no current problems - anxiety related  . Chronic fatigue   . Depression    Gestational  . Dyspnea   . GERD (gastroesophageal reflux  disease)   . History of chickenpox   . HSV (herpes simplex virus) infection   . Hx of pyelonephritis   . Migraine   . Ovarian cyst   . Palpitations    H/O - no current problems-anxiety related  . PONV (postoperative nausea and vomiting)   . Postpartum care following vaginal delivery (2/13) 06/21/2016  . PTSD (post-traumatic stress disorder)   . Smoker   . Spontaneous vaginal delivery 06/21/2016   svd x 3  . Vaginal Pap smear, abnormal     Family History  Problem Relation Age of Onset  . Coronary artery disease Mother 2955  . Hypertension Mother   . Diabetes Mother   . Heart disease Mother   . Kidney Stones Mother   . Asthma Mother   . Kidney disease Mother   . Hyperthyroidism Mother   . COPD Mother   . Heart attack Mother   . Dementia Mother   . Hyperlipidemia Mother   . Arthritis Mother   .  Fibromyalgia Mother   . Obesity Mother   . Other Father        Shot-violence  . Alcohol abuse Father   . Hypertension Brother   . Kidney Stones Brother   . Hyperlipidemia Brother   . COPD Brother   . Heart attack Brother   . Diabetes Brother   . Drug abuse Brother   . Hypertension Brother   . High Cholesterol Brother   . Obesity Brother   . Obesity Brother   . Obesity Brother   . Hypertension Maternal Grandmother   . Hyperlipidemia Maternal Grandmother   . Cancer Maternal Grandmother        Skin  . COPD Maternal Grandmother   . Diabetes Maternal Grandmother   . Cancer Maternal Grandfather        Bone  . Cancer Paternal Grandmother   . Stroke Paternal Grandmother   . Aneurysm Paternal Grandfather   . Hypertension Other   . Diabetes Other   . Asthma Son   . Hypertension Maternal Aunt   . COPD Maternal Aunt   . Diabetes Maternal Aunt   . Heart attack Maternal Aunt   . Cancer Paternal Aunt        Breast  . Asthma Son   . Asthma Daughter     Social History   Socioeconomic History  . Marital status: Married    Spouse name: Not on file  . Number of children: 3   . Years of education: 4  . Highest education level: Not on file  Occupational History  . Occupation: nusre     Employer: OTHER  Tobacco Use  . Smoking status: Current Every Day Smoker    Packs/day: 1.00    Years: 20.00    Pack years: 20.00    Types: Cigarettes  . Smokeless tobacco: Never Used  Vaping Use  . Vaping Use: Never used  Substance and Sexual Activity  . Alcohol use: Not Currently  . Drug use: No  . Sexual activity: Yes  Other Topics Concern  . Not on file  Social History Narrative  . Not on file   Social Determinants of Health   Financial Resource Strain: Not on file  Food Insecurity: Not on file  Transportation Needs: Not on file  Physical Activity: Not on file  Stress: Not on file  Social Connections: Not on file  Intimate Partner Violence: Not on file    Past Medical History, Surgical history, Social history, and Family history were reviewed and updated as appropriate.   Please see review of systems for further details on the patient's review from today.   Objective:   Physical Exam:  There were no vitals taken for this visit.  Physical Exam Constitutional:      General: She is not in acute distress. Musculoskeletal:        General: No deformity.  Neurological:     Mental Status: She is alert and oriented to person, place, and time.     Coordination: Coordination normal.  Psychiatric:        Attention and Perception: Attention and perception normal. She does not perceive auditory or visual hallucinations.        Mood and Affect: Mood normal. Mood is not anxious or depressed. Affect is not labile, blunt, angry or inappropriate.        Speech: Speech normal.        Behavior: Behavior normal.        Thought Content: Thought content normal. Thought content  is not paranoid or delusional. Thought content does not include homicidal or suicidal ideation. Thought content does not include homicidal or suicidal plan.        Cognition and Memory: Cognition  and memory normal.        Judgment: Judgment normal.     Comments: Insight intact     Lab Review:     Component Value Date/Time   NA 138 06/30/2020 1516   K 4.6 06/30/2020 1516   CL 106 06/30/2020 1516   CO2 25 06/30/2020 1516   GLUCOSE 79 06/30/2020 1516   BUN 13 06/30/2020 1516   CREATININE 0.72 06/30/2020 1516   CREATININE 0.67 11/26/2013 1604   CALCIUM 9.8 06/30/2020 1516   PROT 6.3 09/07/2020 1123   ALBUMIN 4.4 06/30/2020 1516   AST 18 06/30/2020 1516   ALT 16 06/30/2020 1516   ALKPHOS 48 06/30/2020 1516   BILITOT 0.4 06/30/2020 1516   GFRNONAA >60 07/31/2018 1414   GFRAA >60 07/31/2018 1414       Component Value Date/Time   WBC 13.6 (H) 06/30/2020 1516   RBC 4.61 06/30/2020 1516   HGB 14.8 06/30/2020 1516   HCT 43.7 06/30/2020 1516   PLT 288.0 06/30/2020 1516   MCV 94.7 06/30/2020 1516   MCV 94.4 07/14/2014 1524   MCH 30.4 07/31/2018 1414   MCHC 33.8 06/30/2020 1516   RDW 13.6 06/30/2020 1516   LYMPHSABS 1.9 06/30/2020 1516   MONOABS 1.0 06/30/2020 1516   EOSABS 0.1 06/30/2020 1516   BASOSABS 0.1 06/30/2020 1516    No results found for: POCLITH, LITHIUM   No results found for: PHENYTOIN, PHENOBARB, VALPROATE, CBMZ   .res Assessment: Plan:    Plan:  Add Cymbalta 20mg  daily  Xanax 0.25mg  daily PRN anxiety - uses occasionally  L-methylfolate   RTC 4 weeks  Working with therapist - .   Patient does not want to trial any further medications - "scared of them".  Out of work currently - does not feel ready to return to work with mental and physical disabilities - currently on LTD through employer.   Multiple failures with SSRI, SNRI, and Wellbutrin - allergic reactions.  Discussed potential benefits, risk, and side effects of benzodiazepines to include potential risk of tolerance and dependence, as well as possible drowsiness.  Advised patient not to drive if experiencing drowsiness and to take lowest possible effective dose to  minimize risk of dependence and tolerance.  Discussed potential benefits, risks, and side effects of stimulants with patient to include increased heart rate, palpitations, insomnia, increased anxiety, increased irritability, or decreased appetite.  Instructed patient to contact office if experiencing any significant tolerability issues.    Diagnoses and all orders for this visit:  Generalized anxiety disorder -     DULoxetine (CYMBALTA) 20 MG capsule; Take 1 capsule (20 mg total) by mouth daily.  Panic attacks  Insomnia, unspecified type  PTSD (post-traumatic stress disorder) -     DULoxetine (CYMBALTA) 20 MG capsule; Take 1 capsule (20 mg total) by mouth daily.  Attention deficit hyperactivity disorder (ADHD), unspecified ADHD type    Please see After Visit Summary for patient specific instructions.  Future Appointments  Date Time Provider Department Center  10/07/2020 11:00 AM 12/07/2020, MD [facility] None    No orders of the defined types were placed in this encounter.     -------------------------------

## 2020-09-22 NOTE — Telephone Encounter (Signed)
Office notes and form faxed on 09/21/2020

## 2020-09-25 NOTE — Progress Notes (Signed)
Office Visit Note  Patient: Abigail White             Date of Birth: 12/06/1980           MRN: [id number]             PCP: Fredirick Lathe, PA-C Referring: No ref. provider found Visit Date: 10/07/2020 Occupation: _0 @  Subjective:  Pain in multiple joints.   History of Present Illness: Abigail White is a 40 y.o. female with history of positive ANA and joint pain.  She states she continues to have pain and stiffness in her bilateral lower back, both hands and feet.  She has not seen any joint swelling.  She continues to have a stiffness, generalized pain and muscle pain.  She has not started Cymbalta yet although she has been prescription.  She states she tried Lexapro few years back and had a lot of side effects.  She states she is very tired.  She is unable to work due to ongoing pain and discomfort.  She has difficulty doing all routine activities.  Activities of Daily Living:  Patient reports morning stiffness for 1-2 hours.   Patient Reports nocturnal pain.   Difficulty dressing/grooming: Reports Difficulty climbing stairs: Reports Difficulty getting out of chair: Reports Difficulty using hands for taps, buttons, cutlery, and/or writing: Reports  Review of Systems  Constitutional: Positive for fatigue.  HENT: Positive for mouth dryness and nose dryness. Negative for mouth sores.   Eyes: Positive for pain, itching and dryness.  Respiratory: Positive for shortness of breath. Negative for difficulty breathing.   Cardiovascular: Positive for palpitations.  Gastrointestinal: Positive for constipation and diarrhea. Negative for blood in stool.  Endocrine: Negative for increased urination.  Genitourinary: Negative for difficulty urinating.  Musculoskeletal: Positive for arthralgias, joint pain, myalgias, morning stiffness, muscle tenderness and myalgias. Negative for joint swelling.  Skin: Negative for color change, rash and redness.  Allergic/Immunologic: Positive  for susceptible to infections.  Neurological: Positive for dizziness, numbness and headaches.  Hematological: Positive for bruising/bleeding tendency.  Psychiatric/Behavioral: Positive for decreased concentration. Negative for confusion.    PMFS History:  Patient Active Problem List   Diagnosis Date Noted  . Thyromegaly 07/24/2017  . Palpable lymph node 07/24/2017  . Visit for preventive health examination 08/07/2016  . Migraine without status migrainosus, not intractable 07/30/2016  . Attention deficit disorder (ADD) 07/26/2016  . Postpartum care following vaginal delivery (2/13) 06/21/2016  . Nicotine addiction 12/02/2012  . Anxiety and depression     Past Medical History:  Diagnosis Date  . ADHD (attention deficit hyperactivity disorder)   . Allergy   . Anemia   . Anxiety    H/O  . Carpal tunnel syndrome    bilateral  . Chest pain    H/O - no current problems - anxiety related  . Chronic fatigue   . Depression    Gestational  . Dyspnea   . GERD (gastroesophageal reflux disease)   . History of chickenpox   . HSV (herpes simplex virus) infection   . Hx of pyelonephritis   . Migraine   . Ovarian cyst   . Palpitations    H/O - no current problems-anxiety related  . PONV (postoperative nausea and vomiting)   . Postpartum care following vaginal delivery (2/13) 06/21/2016  . PTSD (post-traumatic stress disorder)   . Smoker   . Spontaneous vaginal delivery 06/21/2016   svd x 3  . Vaginal Pap smear, abnormal     Family  History  Problem Relation Age of Onset  . Coronary artery disease Mother 60  . Hypertension Mother   . Diabetes Mother   . Heart disease Mother   . Kidney Stones Mother   . Asthma Mother   . Kidney disease Mother   . Hyperthyroidism Mother   . COPD Mother   . Heart attack Mother   . Dementia Mother   . Hyperlipidemia Mother   . Arthritis Mother   . Fibromyalgia Mother   . Obesity Mother   . Other Father        Shot-violence  . Alcohol abuse  Father   . Hypertension Brother   . Kidney Stones Brother   . Hyperlipidemia Brother   . COPD Brother   . Heart attack Brother   . Diabetes Brother   . Drug abuse Brother   . Hypertension Brother   . High Cholesterol Brother   . Obesity Brother   . Obesity Brother   . Obesity Brother   . Hypertension Maternal Grandmother   . Hyperlipidemia Maternal Grandmother   . Cancer Maternal Grandmother        Skin  . COPD Maternal Grandmother   . Diabetes Maternal Grandmother   . Cancer Maternal Grandfather        Bone  . Cancer Paternal Grandmother   . Stroke Paternal Grandmother   . Aneurysm Paternal Grandfather   . Hypertension Other   . Diabetes Other   . Asthma Son   . Hypertension Maternal Aunt   . COPD Maternal Aunt   . Diabetes Maternal Aunt   . Heart attack Maternal Aunt   . Cancer Paternal Aunt        Breast  . Asthma Son   . Asthma Daughter    Past Surgical History:  Procedure Laterality Date  . LEEP  2005, 2013   x 2  . NASAL SINUS SURGERY  1999  . WISDOM TOOTH EXTRACTION     Social History   Social History Narrative  . Not on file   Immunization History  Administered Date(s) Administered  . Influenza, Seasonal, Injecte, Preservative Fre 02/01/2013  . Influenza,inj,Quad PF,6+ Mos 06/20/2016, 01/20/2017, 01/20/2020  . Tdap 06/20/2016     Objective: Vital Signs: BP 106/68 (BP Location: Left Arm, Patient Position: Sitting, Cuff Size: Small)   Pulse 90   Resp 12   Ht 5' 6.75" (1.695 m)   Wt 163 lb 12.8 oz (74.3 kg)   LMP 10/03/2020   BMI 25.85 kg/m    Physical Exam Vitals and nursing note reviewed.  Constitutional:      Appearance: She is well-developed.  HENT:     Head: Normocephalic and atraumatic.  Eyes:     Conjunctiva/sclera: Conjunctivae normal.  Cardiovascular:     Rate and Rhythm: Normal rate and regular rhythm.     Heart sounds: Normal heart sounds.  Pulmonary:     Effort: Pulmonary effort is normal.     Breath sounds: Normal breath  sounds.  Abdominal:     General: Bowel sounds are normal.     Palpations: Abdomen is soft.  Musculoskeletal:     Cervical back: Normal range of motion.  Lymphadenopathy:     Cervical: No cervical adenopathy.  Skin:    General: Skin is warm and dry.     Capillary Refill: Capillary refill takes less than 2 seconds.  Neurological:     Mental Status: She is alert and oriented to person, place, and time.  Psychiatric:  Behavior: Behavior normal.      Musculoskeletal Exam: C-spine was in good range of motion.  Shoulder joints, elbow joints, wrist joints, MCPs PIPs and DIPs with good range of motion with no synovitis.  She has some tenderness over the PIP joints.  Hip joints and knee joints with good range of motion.  She had no tenderness over MTPs or PIPs.  She had generalized hyperalgesia and positive tender points.  CDAI Exam: CDAI Score: -- Patient Global: --; Provider Global: -- Swollen: --; Tender: -- Joint Exam 10/07/2020   No joint exam has been documented for this visit   There is currently no information documented on the homunculus. Go to the Rheumatology activity and complete the homunculus joint exam.  Investigation: No additional findings.  Imaging: XR Foot 2 Views Left  Result Date: 09/07/2020 No MTP, PIP or DIP narrowing was noted.  No intertarsal, tibiotalar or subtalar joint space narrowing was noted.  No erosive changes were noted. Impression: Unremarkable x-ray of the foot.  XR Foot 2 Views Right  Result Date: 09/07/2020 No MTP, PIP or DIP narrowing was noted.  No intertarsal, tibiotalar or subtalar joint space narrowing was noted.  No erosive changes were noted. Impression: Unremarkable x-ray of the foot.  XR Hand 2 View Left  Result Date: 09/07/2020 No MCP, PIP, DIP, intercarpal or radiocarpal joint space narrowing was noted.  No erosive changes were noted. Impression: Unremarkable x-ray of the hand.  XR Hand 2 View Right  Result Date: 09/07/2020 No  MCP, PIP, DIP, intercarpal or radiocarpal joint space narrowing was noted.  No erosive changes were noted. Impression: Unremarkable x-ray of the hand.  XR Lumbar Spine 2-3 Views  Result Date: 09/07/2020 Mild dextroscoliosis was noted.  No SI joint sclerosis was noted.  No significant disc space narrowing was noted.  Facet joint arthropathy was noted. Impression: These findings are consistent with lumbar facet joint arthropathy.   Recent Labs: Lab Results  Component Value Date   WBC 13.6 (H) 06/30/2020   HGB 14.8 06/30/2020   PLT 288.0 06/30/2020   NA 138 06/30/2020   K 4.6 06/30/2020   CL 106 06/30/2020   CO2 25 06/30/2020   GLUCOSE 79 06/30/2020   BUN 13 06/30/2020   CREATININE 0.72 06/30/2020   BILITOT 0.4 06/30/2020   ALKPHOS 48 06/30/2020   AST 18 06/30/2020   ALT 16 06/30/2020   PROT 6.3 09/07/2020   ALBUMIN 4.4 06/30/2020   CALCIUM 9.8 06/30/2020   GFRAA >60 07/31/2018   09/07/20 SPEP-,CK 90, Vit D 33  06/30/20: ANA 1:40NS, ESR 9, RF<14, Vitamin B12 248, Uric acid 3.5, Vitamin D 22.96, Ro-, La-, Lyme IgG/IgM negative   Sep 08, 2020 AVISE lupus index -2.3, ANA negative, ENA negative, CB CAP negative, anticardiolipin negative, beta-2 GP 1 negative, antiphosphatidylserine negative, antihistone negative, RF negative, anti-CCP negative, and the carpi negative, antithyroglobulin negative, anti-TPO negative   Speciality Comments: No specialty comments available.  Procedures:  No procedures performed Allergies: Effexor [venlafaxine], Lexapro [escitalopram oxalate], Paxil [paroxetine hcl], Penicillins, Sulfa antibiotics, Vicodin [hydrocodone-acetaminophen], Wellbutrin [bupropion], Zoloft [sertraline hcl], Differin [adapalene], Doxycycline, Lamictal [lamotrigine], and Latex   Assessment / Plan:     Visit Diagnoses: Arthropathy of lumbar facet joint-she continues to have a lot of pain and discomfort in her lower back.  She has difficulty with prolonged sitting and prolonged  standing.   Pain in both hands -she complains of discomfort in her hands mostly over her PIP joints.  She may have early osteoarthritis.  No synovitis was noted.  X-rays were unremarkable.  Pain in both feet -she complains of discomfort in her feet as well.  No synovitis was noted.  X-rays were unremarkable.  Positive ANA (antinuclear antibody) - ANA 1: 40NS, AVISE lupus index -2.3.  Complete AVISE panel negative.  AVISE labs were discussed at length with the patient.  Fibromyalgia-she has generalized pain, hyperalgesia and positive tender points.  She is going to physical therapy.  Need for regular exercise good sleep hygiene was emphasized.  She will be following with her PCP for treatment of fibromyalgia.  Other fatigue-related to insomnia and fibromyalgia.  Primary insomnia-good sleep hygiene was discussed.  Vitamin D deficiency - Sep 07, 2020 vitamin D 33  Other medical problems are listed as follows:  Thyromegaly  Migraine without status migrainosus, not intractable, unspecified migraine type  Anxiety and depression-she is on disability due to anxiety.  Attention deficit disorder (ADD) without hyperactivity  Seasonal allergies  Orders: No orders of the defined types were placed in this encounter.  No orders of the defined types were placed in this encounter.    Follow-Up Instructions: Return if symptoms worsen or fail to improve, for arthralgias.   Bo Merino, MD  Note - This record has been created using Editor, commissioning.  Chart creation errors have been sought, but may not always  have been located. Such creation errors do not reflect on  the standard of medical care.

## 2020-10-07 ENCOUNTER — Other Ambulatory Visit: Payer: Self-pay

## 2020-10-07 ENCOUNTER — Ambulatory Visit: Payer: 59 | Admitting: Rheumatology

## 2020-10-07 ENCOUNTER — Encounter: Payer: Self-pay | Admitting: Rheumatology

## 2020-10-07 VITALS — BP 106/68 | HR 90 | Resp 12 | Ht 66.75 in | Wt 163.8 lb

## 2020-10-07 DIAGNOSIS — F32A Depression, unspecified: Secondary | ICD-10-CM

## 2020-10-07 DIAGNOSIS — R5383 Other fatigue: Secondary | ICD-10-CM

## 2020-10-07 DIAGNOSIS — M797 Fibromyalgia: Secondary | ICD-10-CM

## 2020-10-07 DIAGNOSIS — M79641 Pain in right hand: Secondary | ICD-10-CM | POA: Diagnosis not present

## 2020-10-07 DIAGNOSIS — M79671 Pain in right foot: Secondary | ICD-10-CM | POA: Diagnosis not present

## 2020-10-07 DIAGNOSIS — R7689 Other specified abnormal immunological findings in serum: Secondary | ICD-10-CM

## 2020-10-07 DIAGNOSIS — F5101 Primary insomnia: Secondary | ICD-10-CM

## 2020-10-07 DIAGNOSIS — M47816 Spondylosis without myelopathy or radiculopathy, lumbar region: Secondary | ICD-10-CM | POA: Diagnosis not present

## 2020-10-07 DIAGNOSIS — M79642 Pain in left hand: Secondary | ICD-10-CM

## 2020-10-07 DIAGNOSIS — J302 Other seasonal allergic rhinitis: Secondary | ICD-10-CM

## 2020-10-07 DIAGNOSIS — G43909 Migraine, unspecified, not intractable, without status migrainosus: Secondary | ICD-10-CM

## 2020-10-07 DIAGNOSIS — F419 Anxiety disorder, unspecified: Secondary | ICD-10-CM

## 2020-10-07 DIAGNOSIS — E010 Iodine-deficiency related diffuse (endemic) goiter: Secondary | ICD-10-CM

## 2020-10-07 DIAGNOSIS — R768 Other specified abnormal immunological findings in serum: Secondary | ICD-10-CM

## 2020-10-07 DIAGNOSIS — F988 Other specified behavioral and emotional disorders with onset usually occurring in childhood and adolescence: Secondary | ICD-10-CM

## 2020-10-07 DIAGNOSIS — E559 Vitamin D deficiency, unspecified: Secondary | ICD-10-CM

## 2020-10-07 DIAGNOSIS — M79672 Pain in left foot: Secondary | ICD-10-CM

## 2020-10-16 ENCOUNTER — Other Ambulatory Visit: Payer: Self-pay | Admitting: Adult Health

## 2020-10-16 DIAGNOSIS — F411 Generalized anxiety disorder: Secondary | ICD-10-CM

## 2020-10-16 DIAGNOSIS — F4310 Post-traumatic stress disorder, unspecified: Secondary | ICD-10-CM

## 2020-10-26 ENCOUNTER — Telehealth (INDEPENDENT_AMBULATORY_CARE_PROVIDER_SITE_OTHER): Payer: 59 | Admitting: Adult Health

## 2020-10-26 DIAGNOSIS — F411 Generalized anxiety disorder: Secondary | ICD-10-CM | POA: Diagnosis not present

## 2020-10-26 DIAGNOSIS — F410 Panic disorder [episodic paroxysmal anxiety] without agoraphobia: Secondary | ICD-10-CM | POA: Diagnosis not present

## 2020-10-26 DIAGNOSIS — F4310 Post-traumatic stress disorder, unspecified: Secondary | ICD-10-CM | POA: Diagnosis not present

## 2020-10-26 DIAGNOSIS — F909 Attention-deficit hyperactivity disorder, unspecified type: Secondary | ICD-10-CM

## 2020-10-26 DIAGNOSIS — G4700 Insomnia, unspecified: Secondary | ICD-10-CM

## 2020-10-26 NOTE — Progress Notes (Signed)
Abigail White [id number] 11/17/1980 40 y.o.  Virtual Visit via Telephone Note  I connected with pt on 10/26/20 at 10:40 AM EDT by telephone and verified that I am speaking with the correct person using two identifiers.   I discussed the limitations, risks, security and privacy concerns of performing an evaluation and management service by telephone and the availability of in person appointments. I also discussed with the patient that there may be a patient responsible charge related to this service. The patient expressed understanding and agreed to proceed.   I discussed the assessment and treatment plan with the patient. The patient was provided an opportunity to ask questions and all were answered. The patient agreed with the plan and demonstrated an understanding of the instructions.   The patient was advised to call back or seek an in-person evaluation if the symptoms worsen or if the condition fails to improve as anticipated.  I provided 30 minutes of non-face-to-face time during this encounter.  The patient was located at home.  The provider was located at [facility].   Dorothyann Gibbs, NP   Subjective:   Patient ID:  Abigail White is a 40 y.o. (DOB 03-16-1981) female.  Chief Complaint: No chief complaint on file.   HPI Abigail White presents for follow-up of GAD, MDD, ADHD, insomnia, panic attacks, PTSD.  Describes mood today as "not good". Pleasant. Tearful at times - "crying for the past 5 days". Pleasant. Mood symptoms - reports depression, anxiety, and irritability. Reports panic attacks. Mind racing - "obsessive thoughts". Trying not to "fall back" into the hole. Stating "I fight every day to be normal". Energy levels lower - "sleep is off". Recently diagnosed with Fibromyalgia. Has picked up a prescription for Cymbalta and is planning to try it. Stating "right now my fear is overriding any potential benefit". Continues to grieve loss of family members.  Receiving long term disability benefits through previous employer. Varying interest and motivation. Taking medications as prescribed. Seeing therapist weekly - Isiah Collins. Energy levels low - pushing herself. Active, does not have a regular exercise routine.  Enjoys some usual interests and activities. Married. Lives with husband and 3 childen 51 y/o son, 59 year old son, and 60 y/o daughter. Extended family local. Appetite adequate. Weight loss - 5 pounds - 155 pounds.  Sleeping difficulties. Averages 3 to 5 hours - sometimes less - "it's broken". Focus and concentration difficulties. Completing tasks. Managing some aspects of household. Out of work currently - does not feel she is ready to return to work setting. Denies SI or HI.  Denies AH or VH.  Review of Systems:  Review of Systems  Musculoskeletal:  Negative for gait problem.  Neurological:  Negative for tremors.  Psychiatric/Behavioral:         Please refer to HPI   Medications: I have reviewed the patient's current medications.  Current Outpatient Medications  Medication Sig Dispense Refill   Acetaminophen (TYLENOL PO) Take by mouth as needed.     albuterol (PROAIR HFA) 108 (90 Base) MCG/ACT inhaler Inhale 2 puffs into the lungs every 6 (six) hours as needed. 18 g 2   ALPRAZolam (XANAX) 0.25 MG tablet Take 1/2 to one tablet daily as needed for anxiety/panic attacks. 30 tablet 2   butalbital-acetaminophen-caffeine (FIORICET) 50-325-40 MG tablet Take by mouth 2 (two) times daily as needed for headache.     cyclobenzaprine (FLEXERIL) 10 MG tablet Take 10 mg by mouth as needed.     DULoxetine (  CYMBALTA) 20 MG capsule TAKE 1 CAPSULE BY MOUTH EVERY DAY 90 capsule 0   fluticasone (FLOVENT HFA) 110 MCG/ACT inhaler TAKE 2 PUFFS BY MOUTH TWICE A DAY 12 g 2   ibuprofen (ADVIL) 800 MG tablet Take 1 tablet (800 mg total) by mouth every 8 (eight) hours as needed for cramping. 90 tablet 0   ondansetron (ZOFRAN) 4 MG tablet Take 1 tablet (4 mg  total) by mouth every 8 (eight) hours as needed for nausea or vomiting. 20 tablet 0   valACYclovir (VALTREX) 500 MG tablet Take 1 tablet as needed for outbreak     No current facility-administered medications for this visit.    Medication Side Effects: None  Allergies:  Allergies  Allergen Reactions   Effexor [Venlafaxine]     Multiple somatic issues   Lexapro [Escitalopram Oxalate]     Mania    Paxil [Paroxetine Hcl]     Feels detached    Penicillins Hives    Has patient had a PCN reaction causing immediate rash, facial/tongue/throat swelling, SOB or lightheadedness with hypotension: no Has patient had a PCN reaction causing severe rash involving mucus membranes or skin necrosis: yes Has patient had a PCN reaction that required hospitalization no Has patient had a PCN reaction occurring within the last 10 years: no If all of the above answers are "NO", then may proceed with Cephalosporin use.    Sulfa Antibiotics    Vicodin [Hydrocodone-Acetaminophen] Nausea And Vomiting    Projectile vomiting   Wellbutrin [Bupropion]     Became belligerant   Zoloft [Sertraline Hcl] Hives    rash   Differin [Adapalene] Rash   Doxycycline Rash   Lamictal [Lamotrigine] Rash   Latex Rash    Past Medical History:  Diagnosis Date   ADHD (attention deficit hyperactivity disorder)    Allergy    Anemia    Anxiety    H/O   Carpal tunnel syndrome    bilateral   Chest pain    H/O - no current problems - anxiety related   Chronic fatigue    Depression    Gestational   Dyspnea    GERD (gastroesophageal reflux disease)    History of chickenpox    HSV (herpes simplex virus) infection    Hx of pyelonephritis    Migraine    Ovarian cyst    Palpitations    H/O - no current problems-anxiety related   PONV (postoperative nausea and vomiting)    Postpartum care following vaginal delivery (2/13) 06/21/2016   PTSD (post-traumatic stress disorder)    Smoker    Spontaneous vaginal delivery  06/21/2016   svd x 3   Vaginal Pap smear, abnormal     Family History  Problem Relation Age of Onset   Coronary artery disease Mother 10255   Hypertension Mother    Diabetes Mother    Heart disease Mother    Kidney Stones Mother    Asthma Mother    Kidney disease Mother    Hyperthyroidism Mother    COPD Mother    Heart attack Mother    Dementia Mother    Hyperlipidemia Mother    Arthritis Mother    Fibromyalgia Mother    Obesity Mother    Other Father        Shot-violence   Alcohol abuse Father    Hypertension Brother    Kidney Stones Brother    Hyperlipidemia Brother    COPD Brother    Heart attack Brother  Diabetes Brother    Drug abuse Brother    Hypertension Brother    High Cholesterol Brother    Obesity Brother    Obesity Brother    Obesity Brother    Hypertension Maternal Grandmother    Hyperlipidemia Maternal Grandmother    Cancer Maternal Grandmother        Skin   COPD Maternal Grandmother    Diabetes Maternal Grandmother    Cancer Maternal Grandfather        Bone   Cancer Paternal Grandmother    Stroke Paternal Grandmother    Aneurysm Paternal Grandfather    Hypertension Other    Diabetes Other    Asthma Son    Hypertension Maternal Aunt    COPD Maternal Aunt    Diabetes Maternal Aunt    Heart attack Maternal Aunt    Cancer Paternal Aunt        Breast   Asthma Son    Asthma Daughter     Social History   Socioeconomic History   Marital status: Married    Spouse name: Not on file   Number of children: 3   Years of education: 4   Highest education level: Not on file  Occupational History   Occupation: nusre     Associate Professor: OTHER  Tobacco Use   Smoking status: Every Day    Packs/day: 1.00    Years: 20.00    Pack years: 20.00    Types: Cigarettes   Smokeless tobacco: Never  Vaping Use   Vaping Use: Never used  Substance and Sexual Activity   Alcohol use: Not Currently   Drug use: No   Sexual activity: Yes  Other Topics Concern    Not on file  Social History Narrative   Not on file   Social Determinants of Health   Financial Resource Strain: Not on file  Food Insecurity: Not on file  Transportation Needs: Not on file  Physical Activity: Not on file  Stress: Not on file  Social Connections: Not on file  Intimate Partner Violence: Not on file    Past Medical History, Surgical history, Social history, and Family history were reviewed and updated as appropriate.   Please see review of systems for further details on the patient's review from today.   Objective:   Physical Exam:  LMP 10/03/2020   Physical Exam Constitutional:      General: She is not in acute distress. Musculoskeletal:        General: No deformity.  Neurological:     Mental Status: She is alert and oriented to person, place, and time.     Cranial Nerves: No dysarthria.     Coordination: Coordination normal.  Psychiatric:        Attention and Perception: Attention and perception normal. She does not perceive auditory or visual hallucinations.        Mood and Affect: Mood normal. Mood is not anxious or depressed. Affect is not labile, blunt, angry or inappropriate.        Speech: Speech normal.        Behavior: Behavior normal. Behavior is cooperative.        Thought Content: Thought content normal. Thought content is not paranoid or delusional. Thought content does not include homicidal or suicidal ideation. Thought content does not include homicidal or suicidal plan.        Cognition and Memory: Cognition and memory normal.        Judgment: Judgment normal.     Comments:  Insight intact    Lab Review:     Component Value Date/Time   NA 138 06/30/2020 1516   K 4.6 06/30/2020 1516   CL 106 06/30/2020 1516   CO2 25 06/30/2020 1516   GLUCOSE 79 06/30/2020 1516   BUN 13 06/30/2020 1516   CREATININE 0.72 06/30/2020 1516   CREATININE 0.67 11/26/2013 1604   CALCIUM 9.8 06/30/2020 1516   PROT 6.3 09/07/2020 1123   ALBUMIN 4.4  06/30/2020 1516   AST 18 06/30/2020 1516   ALT 16 06/30/2020 1516   ALKPHOS 48 06/30/2020 1516   BILITOT 0.4 06/30/2020 1516   GFRNONAA >60 07/31/2018 1414   GFRAA >60 07/31/2018 1414       Component Value Date/Time   WBC 13.6 (H) 06/30/2020 1516   RBC 4.61 06/30/2020 1516   HGB 14.8 06/30/2020 1516   HCT 43.7 06/30/2020 1516   PLT 288.0 06/30/2020 1516   MCV 94.7 06/30/2020 1516   MCV 94.4 07/14/2014 1524   MCH 30.4 07/31/2018 1414   MCHC 33.8 06/30/2020 1516   RDW 13.6 06/30/2020 1516   LYMPHSABS 1.9 06/30/2020 1516   MONOABS 1.0 06/30/2020 1516   EOSABS 0.1 06/30/2020 1516   BASOSABS 0.1 06/30/2020 1516    No results found for: POCLITH, LITHIUM   No results found for: PHENYTOIN, PHENOBARB, VALPROATE, CBMZ   .res Assessment: Plan:    Plan:  Add Cymbalta 20mg  daily  Xanax 0.25mg  daily PRN anxiety - uses occasionally  L-methylfolate   RTC 4 weeks  Working with therapist - .   Out of work currently - does not feel ready to return to work with mental and physical disabilities - currently on LTD through employer.   Multiple failures with SSRI, SNRI, and Wellbutrin - allergic reactions.  Discussed potential benefits, risk, and side effects of benzodiazepines to include potential risk of tolerance and dependence, as well as possible drowsiness.  Advised patient not to drive if experiencing drowsiness and to take lowest possible effective dose to minimize risk of dependence and tolerance.  Discussed potential benefits, risks, and side effects of stimulants with patient to include increased heart rate, palpitations, insomnia, increased anxiety, increased irritability, or decreased appetite.  Instructed patient to contact office if experiencing any significant tolerability issues.    Diagnoses and all orders for this visit:  Generalized anxiety disorder  Panic attacks  Insomnia, unspecified type  PTSD (post-traumatic stress disorder)  Attention  deficit hyperactivity disorder (ADHD), unspecified ADHD type   Please see After Visit Summary for patient specific instructions.  Future Appointments  Date Time Provider Department Center  11/15/2020 10:00 AM [place], 01/16/2021, PA-C [facility]    No orders of the defined types were placed in this encounter.     -------------------------------

## 2020-11-04 ENCOUNTER — Ambulatory Visit (HOSPITAL_BASED_OUTPATIENT_CLINIC_OR_DEPARTMENT_OTHER): Payer: 59 | Admitting: Physical Therapy

## 2020-11-10 ENCOUNTER — Ambulatory Visit (HOSPITAL_BASED_OUTPATIENT_CLINIC_OR_DEPARTMENT_OTHER): Payer: 59 | Attending: Rheumatology | Admitting: Physical Therapy

## 2020-11-15 ENCOUNTER — Other Ambulatory Visit: Payer: Self-pay

## 2020-11-15 ENCOUNTER — Ambulatory Visit (INDEPENDENT_AMBULATORY_CARE_PROVIDER_SITE_OTHER): Payer: 59 | Admitting: Physician Assistant

## 2020-11-15 VITALS — BP 101/69 | HR 75 | Temp 98.2°F | Ht 66.75 in | Wt 156.5 lb

## 2020-11-15 DIAGNOSIS — G8929 Other chronic pain: Secondary | ICD-10-CM | POA: Diagnosis not present

## 2020-11-15 DIAGNOSIS — M2550 Pain in unspecified joint: Secondary | ICD-10-CM | POA: Diagnosis not present

## 2020-11-15 DIAGNOSIS — F32A Anxiety disorder, unspecified: Secondary | ICD-10-CM

## 2020-11-15 DIAGNOSIS — F419 Anxiety disorder, unspecified: Secondary | ICD-10-CM

## 2020-11-15 DIAGNOSIS — M797 Fibromyalgia: Secondary | ICD-10-CM | POA: Diagnosis not present

## 2020-11-15 NOTE — Patient Instructions (Signed)
You need to start your Cymbalta. This medication is indicated specifically for Fibromyalgia, anxiety, and depression. Start with the 20 mg dose. I will recheck with you in the next two weeks. Continue to meet with your counselor.

## 2020-11-15 NOTE — Progress Notes (Signed)
Established Patient Office Visit  Subjective:  Patient ID: Abigail White, female    DOB: [birth year]  Age: 40 y.o. MRN: [id number]  CC:  Chief Complaint  Patient presents with   Joint Pain    HPI RYAN PALERMO presents for ongoing joint pain and discussion about fibromyalgia.  She met with Dr. Estanislado Pandy on 10/07/20. Diagnosed with fibromyalgia and informed to f/up with me for treatment. She is supposed to be going to PT, but states she has already missed two appointments.  She also has rx for Cymbalta but is nervous to start on this because she has not tolerated some medications well in the past.  She states that her husband has battled with drug addiction and she knows this is the root of her stress and "issues". She has a counselor she talks to about once weekly for the last few years. She also goes to [facility].   She is very tearful today, but denies any SI.   Past Medical History:  Diagnosis Date   ADHD (attention deficit hyperactivity disorder)    Allergy    Anemia    Anxiety    H/O   Carpal tunnel syndrome    bilateral   Chest pain    H/O - no current problems - anxiety related   Chronic fatigue    Depression    Gestational   Dyspnea    GERD (gastroesophageal reflux disease)    History of chickenpox    HSV (herpes simplex virus) infection    Hx of pyelonephritis    Migraine    Ovarian cyst    Palpitations    H/O - no current problems-anxiety related   PONV (postoperative nausea and vomiting)    Postpartum care following vaginal delivery (2/13) 06/21/2016   PTSD (post-traumatic stress disorder)    Smoker    Spontaneous vaginal delivery 06/21/2016   svd x 3   Vaginal Pap smear, abnormal     Past Surgical History:  Procedure Laterality Date   LEEP  2005, 2013   x 2   NASAL SINUS SURGERY  1999   WISDOM TOOTH EXTRACTION      Family History  Problem Relation Age of Onset   Coronary artery disease Mother 76   Hypertension Mother     Diabetes Mother    Heart disease Mother    Kidney Stones Mother    Asthma Mother    Kidney disease Mother    Hyperthyroidism Mother    COPD Mother    Heart attack Mother    Dementia Mother    Hyperlipidemia Mother    Arthritis Mother    Fibromyalgia Mother    Obesity Mother    Other Father        Shot-violence   Alcohol abuse Father    Hypertension Brother    Kidney Stones Brother    Hyperlipidemia Brother    COPD Brother    Heart attack Brother    Diabetes Brother    Drug abuse Brother    Hypertension Brother    High Cholesterol Brother    Obesity Brother    Obesity Brother    Obesity Brother    Hypertension Maternal Grandmother    Hyperlipidemia Maternal Grandmother    Cancer Maternal Grandmother        Skin   COPD Maternal Grandmother    Diabetes Maternal Grandmother    Cancer Maternal Grandfather        Bone   Cancer Paternal Grandmother  Stroke Paternal Grandmother    Aneurysm Paternal Grandfather    Hypertension Other    Diabetes Other    Asthma Son    Hypertension Maternal Aunt    COPD Maternal Aunt    Diabetes Maternal Aunt    Heart attack Maternal Aunt    Cancer Paternal Aunt        Breast   Asthma Son    Asthma Daughter     Social History   Socioeconomic History   Marital status: Married    Spouse name: Not on file   Number of children: 3   Years of education: 4   Highest education level: Not on file  Occupational History   Occupation: nusre     Fish farm manager: OTHER  Tobacco Use   Smoking status: Every Day    Packs/day: 1.00    Years: 20.00    Pack years: 20.00    Types: Cigarettes   Smokeless tobacco: Never  Vaping Use   Vaping Use: Never used  Substance and Sexual Activity   Alcohol use: Not Currently   Drug use: No   Sexual activity: Yes  Other Topics Concern   Not on file  Social History Narrative   Not on file   Social Determinants of Health   Financial Resource Strain: Not on file  Food Insecurity: Not on file   Transportation Needs: Not on file  Physical Activity: Not on file  Stress: Not on file  Social Connections: Not on file  Intimate Partner Violence: Not on file    Outpatient Medications Prior to Visit  Medication Sig Dispense Refill   Acetaminophen (TYLENOL PO) Take by mouth as needed.     albuterol (PROAIR HFA) 108 (90 Base) MCG/ACT inhaler Inhale 2 puffs into the lungs every 6 (six) hours as needed. 18 g 2   ALPRAZolam (XANAX) 0.25 MG tablet Take 1/2 to one tablet daily as needed for anxiety/panic attacks. 30 tablet 2   butalbital-acetaminophen-caffeine (FIORICET) 50-325-40 MG tablet Take by mouth 2 (two) times daily as needed for headache.     cyclobenzaprine (FLEXERIL) 10 MG tablet Take 10 mg by mouth as needed.     fluticasone (FLOVENT HFA) 110 MCG/ACT inhaler TAKE 2 PUFFS BY MOUTH TWICE A DAY 12 g 2   ibuprofen (ADVIL) 800 MG tablet Take 1 tablet (800 mg total) by mouth every 8 (eight) hours as needed for cramping. 90 tablet 0   ondansetron (ZOFRAN) 4 MG tablet Take 1 tablet (4 mg total) by mouth every 8 (eight) hours as needed for nausea or vomiting. 20 tablet 0   valACYclovir (VALTREX) 500 MG tablet Take 1 tablet as needed for outbreak     DULoxetine (CYMBALTA) 20 MG capsule TAKE 1 CAPSULE BY MOUTH EVERY DAY (Patient not taking: Reported on 11/15/2020) 90 capsule 0   No facility-administered medications prior to visit.    Allergies  Allergen Reactions   Effexor [Venlafaxine]     Multiple somatic issues   Lexapro [Escitalopram Oxalate]     Mania    Paxil [Paroxetine Hcl]     Feels detached    Penicillins Hives    Has patient had a PCN reaction causing immediate rash, facial/tongue/throat swelling, SOB or lightheadedness with hypotension: no Has patient had a PCN reaction causing severe rash involving mucus membranes or skin necrosis: yes Has patient had a PCN reaction that required hospitalization no Has patient had a PCN reaction occurring within the last 10 years:  no If all of the above answers  are "NO", then may proceed with Cephalosporin use.    Sulfa Antibiotics    Vicodin [Hydrocodone-Acetaminophen] Nausea And Vomiting    Projectile vomiting   Wellbutrin [Bupropion]     Became belligerant   Zoloft [Sertraline Hcl] Hives    rash   Differin [Adapalene] Rash   Doxycycline Rash   Lamictal [Lamotrigine] Rash   Latex Rash    ROS Review of Systems  Constitutional:  Negative for fever.  Respiratory:  Negative for shortness of breath.   Cardiovascular:  Negative for chest pain.  Gastrointestinal:  Negative for abdominal pain, constipation, diarrhea and nausea.  Musculoskeletal:  Positive for arthralgias, joint swelling and myalgias. Negative for back pain and gait problem.  Psychiatric/Behavioral:  Positive for dysphoric mood and sleep disturbance. Negative for suicidal ideas. The patient is nervous/anxious.      Objective:    Physical Exam Vitals and nursing note reviewed.  Constitutional:      Appearance: Normal appearance. She is normal weight. She is not toxic-appearing.  HENT:     Head: Normocephalic and atraumatic.     Right Ear: External ear normal.     Left Ear: External ear normal.     Nose: Nose normal.     Mouth/Throat:     Mouth: Mucous membranes are moist.  Eyes:     Extraocular Movements: Extraocular movements intact.     Conjunctiva/sclera: Conjunctivae normal.     Pupils: Pupils are equal, round, and reactive to light.  Cardiovascular:     Rate and Rhythm: Normal rate and regular rhythm.     Pulses: Normal pulses.     Heart sounds: Normal heart sounds.  Pulmonary:     Effort: Pulmonary effort is normal.     Breath sounds: Normal breath sounds.  Abdominal:     Tenderness: There is no right CVA tenderness or left CVA tenderness.  Musculoskeletal:        General: Tenderness (Right index MCP joint especially painful) present. Normal range of motion.     Cervical back: Normal range of motion and neck supple.   Skin:    General: Skin is warm and dry.  Neurological:     General: No focal deficit present.     Mental Status: She is alert and oriented to person, place, and time.  Psychiatric:     Comments: VERY TEARFUL    BP 101/69   Pulse 75   Temp 98.2 F (36.8 C)   Ht 5' 6.75" (1.695 m)   Wt 156 lb 8 oz (71 kg)   SpO2 98%   BMI 24.70 kg/m  Wt Readings from Last 3 Encounters:  11/15/20 156 lb 8 oz (71 kg)  10/07/20 163 lb 12.8 oz (74.3 kg)  09/07/20 162 lb 9.6 oz (73.8 kg)     Health Maintenance Due  Topic Date Due   PAP SMEAR-Modifier  07/20/2017    There are no preventive care reminders to display for this patient.  Lab Results  Component Value Date   TSH 0.79 06/30/2020   Lab Results  Component Value Date   WBC 13.6 (H) 06/30/2020   HGB 14.8 06/30/2020   HCT 43.7 06/30/2020   MCV 94.7 06/30/2020   PLT 288.0 06/30/2020   Lab Results  Component Value Date   NA 138 06/30/2020   K 4.6 06/30/2020   CO2 25 06/30/2020   GLUCOSE 79 06/30/2020   BUN 13 06/30/2020   CREATININE 0.72 06/30/2020   BILITOT 0.4 06/30/2020   ALKPHOS 48 06/30/2020  AST 18 06/30/2020   ALT 16 06/30/2020   PROT 6.3 09/07/2020   ALBUMIN 4.4 06/30/2020   CALCIUM 9.8 06/30/2020   ANIONGAP 8 07/31/2018   GFR 105.38 06/30/2020   Lab Results  Component Value Date   CHOL 184 01/20/2020   Lab Results  Component Value Date   HDL 43.60 01/20/2020   Lab Results  Component Value Date   LDLCALC 123 (H) 01/20/2020   Lab Results  Component Value Date   TRIG 86.0 01/20/2020   Lab Results  Component Value Date   CHOLHDL 4 01/20/2020   Lab Results  Component Value Date   HGBA1C 5.6 01/20/2020      Assessment & Plan:   Problem List Items Addressed This Visit       Other   Anxiety and depression   Fibromyalgia - Primary   Chronic pain of multiple joints    No orders of the defined types were placed in this encounter.   Follow-up: Return in about 2 weeks (around 11/29/2020)  for Cymbalta recheck .   1. Fibromyalgia 2. Chronic pain of multiple joints She has a prescription for Cymbalta 20 mg, she just needs to start this. I reassured her about the medication and its indications. Risks vs benefits discussed again including ongoing or worsening pain and fatigue vs actually making a step towards feeling better. She also needs to exercise and go to physical therapy. I think a lot of her symptoms are exacerbated by stress at home and lack of sleep.  3. Anxiety and depression She will continue f/up with counselor and psychiatry.  Total encounter time including face to face and chart review and documentation was 39 minutes.    Donnel Venuto M Harley Fitzwater, PA-C

## 2020-11-29 ENCOUNTER — Telehealth (INDEPENDENT_AMBULATORY_CARE_PROVIDER_SITE_OTHER): Payer: 59 | Admitting: Physician Assistant

## 2020-11-29 DIAGNOSIS — J069 Acute upper respiratory infection, unspecified: Secondary | ICD-10-CM

## 2020-11-29 DIAGNOSIS — F419 Anxiety disorder, unspecified: Secondary | ICD-10-CM | POA: Diagnosis not present

## 2020-11-29 DIAGNOSIS — G8929 Other chronic pain: Secondary | ICD-10-CM

## 2020-11-29 DIAGNOSIS — M2550 Pain in unspecified joint: Secondary | ICD-10-CM

## 2020-11-29 DIAGNOSIS — F32A Depression, unspecified: Secondary | ICD-10-CM

## 2020-11-29 DIAGNOSIS — M797 Fibromyalgia: Secondary | ICD-10-CM | POA: Diagnosis not present

## 2020-11-29 MED ORDER — BENZONATATE 100 MG PO CAPS: 100.0000 mg | ORAL_CAPSULE | Freq: Three times a day (TID) | ORAL | 0 refills | Status: DC | PRN

## 2020-11-29 NOTE — Patient Instructions (Signed)
Please call to schedule follow-up with me in 3 months.  Please call your psychiatrist and schedule an appointment with them as well to follow-up.

## 2020-11-29 NOTE — Progress Notes (Signed)
Virtual Visit via Video Note  I connected with Abigail White on 11/29/20 at 11:00 AM EDT by a video enabled telemedicine application and verified that I am speaking with the correct person using two identifiers.  Location: Patient: home Provider: Nature conservation officer at [employer] Persons present: Patient and myself   I discussed the limitations of evaluation and management by telemedicine and the availability of in person appointments. The patient expressed understanding and agreed to proceed.   History of Present Illness:  Presents via video visit for recheck on depression and arthralgias.  She tried Cymbalta for about 1.5 weeks. It made her sleepier and nauseated. She was taking it around 7 pm and still felt tired the next day. Also felt like she was crying more while taking the medication. She talked with her therapist, who suggested she stop it and try it again at a later time because she is also battling sickness. Recently finished steroid pack and Z-pack for likely URI (three COVID tests were negative).   She has been out of work for the last 2 years because of her symptoms.  She says she does not have hope that she is ever going to feel better.  She still has stress in her life being an only parent to her children as her husband is battling addiction still.   Observations/Objective:   Gen: Awake, alert Resp: Breathing is even and non-labored; active productive cough Psych: Anxious, tearful Neuro: Alert and Oriented x 3, + facial symmetry, speech is clear.   Assessment and Plan:  1. Fibromyalgia 2. Chronic pain of multiple joints 3. Anxiety and depression With her acute illness, she agrees this was most likely not the best time to try the Cymbalta.  She is going to try again at a later date.  She is going to recheck with her psychiatrist, Abigail White, who initially prescribed the Cymbalta, as soon as she can and schedule a follow-up.  I advised that we could talk  about other pain management options for her and she very adamantly stated that she does not ever want any pain medicine or to go to pain management because she has a family history of problems with this.  She is going to continue follow-up with her counselor.  She is going to let me know how she is doing.  We plan to follow-up again in about 3 months.  4. Acute upper respiratory infection Slightly improving.  I sent in [facility] for her to help with the cough.   Follow Up Instructions:    I discussed the assessment and treatment plan with the patient. The patient was provided an opportunity to ask questions and all were answered. The patient agreed with the plan and demonstrated an understanding of the instructions.   The patient was advised to call back or seek an in-person evaluation if the symptoms worsen or if the condition fails to improve as anticipated.  Abigail White M Creighton Longley, PA-C

## 2020-12-03 ENCOUNTER — Telehealth (INDEPENDENT_AMBULATORY_CARE_PROVIDER_SITE_OTHER): Payer: 59 | Admitting: Adult Health

## 2020-12-03 ENCOUNTER — Encounter: Payer: Self-pay | Admitting: Adult Health

## 2020-12-03 DIAGNOSIS — F4310 Post-traumatic stress disorder, unspecified: Secondary | ICD-10-CM | POA: Diagnosis not present

## 2020-12-03 DIAGNOSIS — F411 Generalized anxiety disorder: Secondary | ICD-10-CM | POA: Diagnosis not present

## 2020-12-03 DIAGNOSIS — G4700 Insomnia, unspecified: Secondary | ICD-10-CM

## 2020-12-03 DIAGNOSIS — F909 Attention-deficit hyperactivity disorder, unspecified type: Secondary | ICD-10-CM

## 2020-12-03 DIAGNOSIS — F410 Panic disorder [episodic paroxysmal anxiety] without agoraphobia: Secondary | ICD-10-CM | POA: Diagnosis not present

## 2020-12-03 MED ORDER — ALPRAZOLAM 0.25 MG PO TABS: ORAL_TABLET | ORAL | 2 refills | Status: DC

## 2020-12-03 NOTE — Progress Notes (Signed)
Abigail White [id number] 07-Jun-1980 40 y.o. Virtual Visit via Video Note  I connected with pt @ on 12/03/20 at 10:40 AM EDT by a video enabled telemedicine application and verified that I am speaking with the correct person using two identifiers.   I discussed the limitations of evaluation and management by telemedicine and the availability of in person appointments. The patient expressed understanding and agreed to proceed.  I discussed the assessment and treatment plan with the patient. The patient was provided an opportunity to ask questions and all were answered. The patient agreed with the plan and demonstrated an understanding of the instructions.   The patient was advised to call back or seek an in-person evaluation if the symptoms worsen or if the condition fails to improve as anticipated.  I provided 30 minutes of non-face-to-face time during this encounter.  The patient was located at home.  The provider [facility].   Dorothyann Gibbsegina N Derak Schurman, NP   Subjective:   Patient ID:  Abigail White is a 40 y.o. (DOB 07-Jun-1980) female.  Chief Complaint: No chief complaint on file.   HPI Abigail White presents for follow-up of GAD, MDD, ADHD, insomnia, panic attacks, PTSD.  Describes mood today as "not good at all". Pleasant. Increased tearfulness. Pleasant. Mood symptoms - reports depression, anxiety, and irritability. Reports panic attacks. Feels overwhelmed. Tried Cymbalta for a week and a half- increased anxiety, made her sleepy, pain was intensified. Diagnosed with an URI during the time she started it. Husband left a month ago and has not returned. Increased tearfulness - "balled my eyes out". Has used Xanax to help with increased anxiety. Working through trauma/grief loss of family members. Receiving long term disability benefits through previous employer. Varying interest and motivation. Taking medications as prescribed. Seeing therapist weekly - Isiah  Collins. Energy levels low - pushing herself. Active, does not have a regular exercise routine.  Enjoys some usual interests and activities. Married. Lives with husband and 3 childen 40 y/o son, 40 year old son, and 583 y/o daughter. Got a puppy yesterday and feels it will help her and the children. Extended family local. Appetite adequate. Weight loss and then gained it back - 155 pounds.  Sleeping difficulties - "it's spotty". Averages 3 to 7 hours. Focus and concentration difficulties. Completing tasks. Managing some aspects of household. Out of work currently - does not feel she is ready to return to work setting. Denies SI or HI.  Denies AH or VH.     Review of Systems:  Review of Systems  Musculoskeletal:  Negative for gait problem.  Neurological:  Negative for tremors.  Psychiatric/Behavioral:         Please refer to HPI   Medications: I have reviewed the patient's current medications.  Current Outpatient Medications  Medication Sig Dispense Refill   Acetaminophen (TYLENOL PO) Take by mouth as needed.     albuterol (PROAIR HFA) 108 (90 Base) MCG/ACT inhaler Inhale 2 puffs into the lungs every 6 (six) hours as needed. 18 g 2   ALPRAZolam (XANAX) 0.25 MG tablet Take 1/2 to one tablet daily as needed for anxiety/panic attacks. 30 tablet 2   benzonatate (TESSALON PERLES) 100 MG capsule Take 1 capsule (100 mg total) by mouth 3 (three) times daily as needed for cough. 20 capsule 0   butalbital-acetaminophen-caffeine (FIORICET) 50-325-40 MG tablet Take by mouth 2 (two) times daily as needed for headache.     cyclobenzaprine (FLEXERIL) 10 MG tablet Take 10  mg by mouth as needed.     DULoxetine (CYMBALTA) 20 MG capsule TAKE 1 CAPSULE BY MOUTH EVERY DAY (Patient not taking: Reported on 11/29/2020) 90 capsule 0   fluticasone (FLOVENT HFA) 110 MCG/ACT inhaler TAKE 2 PUFFS BY MOUTH TWICE A DAY 12 g 2   ibuprofen (ADVIL) 800 MG tablet Take 1 tablet (800 mg total) by mouth every 8 (eight) hours  as needed for cramping. 90 tablet 0   ondansetron (ZOFRAN) 4 MG tablet Take 1 tablet (4 mg total) by mouth every 8 (eight) hours as needed for nausea or vomiting. 20 tablet 0   valACYclovir (VALTREX) 500 MG tablet Take 1 tablet as needed for outbreak     No current facility-administered medications for this visit.    Medication Side Effects: None  Allergies:  Allergies  Allergen Reactions   Effexor [Venlafaxine]     Multiple somatic issues   Lexapro [Escitalopram Oxalate]     Mania    Paxil [Paroxetine Hcl]     Feels detached    Penicillins Hives    Has patient had a PCN reaction causing immediate rash, facial/tongue/throat swelling, SOB or lightheadedness with hypotension: no Has patient had a PCN reaction causing severe rash involving mucus membranes or skin necrosis: yes Has patient had a PCN reaction that required hospitalization no Has patient had a PCN reaction occurring within the last 10 years: no If all of the above answers are "NO", then may proceed with Cephalosporin use.    Sulfa Antibiotics    Vicodin [Hydrocodone-Acetaminophen] Nausea And Vomiting    Projectile vomiting   Wellbutrin [Bupropion]     Became belligerant   Zoloft [Sertraline Hcl] Hives    rash   Differin [Adapalene] Rash   Doxycycline Rash   Lamictal [Lamotrigine] Rash   Latex Rash    Past Medical History:  Diagnosis Date   ADHD (attention deficit hyperactivity disorder)    Allergy    Anemia    Anxiety    H/O   Carpal tunnel syndrome    bilateral   Chest pain    H/O - no current problems - anxiety related   Chronic fatigue    Depression    Gestational   Dyspnea    GERD (gastroesophageal reflux disease)    History of chickenpox    HSV (herpes simplex virus) infection    Hx of pyelonephritis    Migraine    Ovarian cyst    Palpitations    H/O - no current problems-anxiety related   PONV (postoperative nausea and vomiting)    Postpartum care following vaginal delivery (2/13)  06/21/2016   PTSD (post-traumatic stress disorder)    Smoker    Spontaneous vaginal delivery 06/21/2016   svd x 3   Vaginal Pap smear, abnormal     Family History  Problem Relation Age of Onset   Coronary artery disease Mother 15   Hypertension Mother    Diabetes Mother    Heart disease Mother    Kidney Stones Mother    Asthma Mother    Kidney disease Mother    Hyperthyroidism Mother    COPD Mother    Heart attack Mother    Dementia Mother    Hyperlipidemia Mother    Arthritis Mother    Fibromyalgia Mother    Obesity Mother    Other Father        Shot-violence   Alcohol abuse Father    Hypertension Brother    Kidney Stones Brother  Hyperlipidemia Brother    COPD Brother    Heart attack Brother    Diabetes Brother    Drug abuse Brother    Hypertension Brother    High Cholesterol Brother    Obesity Brother    Obesity Brother    Obesity Brother    Hypertension Maternal Grandmother    Hyperlipidemia Maternal Grandmother    Cancer Maternal Grandmother        Skin   COPD Maternal Grandmother    Diabetes Maternal Grandmother    Cancer Maternal Grandfather        Bone   Cancer Paternal Grandmother    Stroke Paternal Grandmother    Aneurysm Paternal Grandfather    Hypertension Other    Diabetes Other    Asthma Son    Hypertension Maternal Aunt    COPD Maternal Aunt    Diabetes Maternal Aunt    Heart attack Maternal Aunt    Cancer Paternal Aunt        Breast   Asthma Son    Asthma Daughter     Social History   Socioeconomic History   Marital status: Married    Spouse name: Not on file   Number of children: 3   Years of education: 4   Highest education level: Not on file  Occupational History   Occupation: nusre     Associate Professor: OTHER  Tobacco Use   Smoking status: Every Day    Packs/day: 1.00    Years: 20.00    Pack years: 20.00    Types: Cigarettes   Smokeless tobacco: Never  Vaping Use   Vaping Use: Never used  Substance and Sexual Activity    Alcohol use: Not Currently   Drug use: No   Sexual activity: Yes  Other Topics Concern   Not on file  Social History Narrative   Not on file   Social Determinants of Health   Financial Resource Strain: Not on file  Food Insecurity: Not on file  Transportation Needs: Not on file  Physical Activity: Not on file  Stress: Not on file  Social Connections: Not on file  Intimate Partner Violence: Not on file    Past Medical History, Surgical history, Social history, and Family history were reviewed and updated as appropriate.   Please see review of systems for further details on the patient's review from today.   Objective:   Physical Exam:  There were no vitals taken for this visit.  Physical Exam Constitutional:      General: She is not in acute distress. Musculoskeletal:        General: No deformity.  Neurological:     Mental Status: She is alert and oriented to person, place, and time.     Coordination: Coordination normal.  Psychiatric:        Attention and Perception: Attention and perception normal. She does not perceive auditory or visual hallucinations.        Mood and Affect: Mood normal. Mood is not anxious or depressed. Affect is not labile, blunt, angry or inappropriate.        Speech: Speech normal.        Behavior: Behavior normal.        Thought Content: Thought content normal. Thought content is not paranoid or delusional. Thought content does not include homicidal or suicidal ideation. Thought content does not include homicidal or suicidal plan.        Cognition and Memory: Cognition and memory normal.  Judgment: Judgment normal.     Comments: Insight intact    Lab Review:     Component Value Date/Time   NA 138 06/30/2020 1516   K 4.6 06/30/2020 1516   CL 106 06/30/2020 1516   CO2 25 06/30/2020 1516   GLUCOSE 79 06/30/2020 1516   BUN 13 06/30/2020 1516   CREATININE 0.72 06/30/2020 1516   CREATININE 0.67 11/26/2013 1604   CALCIUM 9.8  06/30/2020 1516   PROT 6.3 09/07/2020 1123   ALBUMIN 4.4 06/30/2020 1516   AST 18 06/30/2020 1516   ALT 16 06/30/2020 1516   ALKPHOS 48 06/30/2020 1516   BILITOT 0.4 06/30/2020 1516   GFRNONAA >60 07/31/2018 1414   GFRAA >60 07/31/2018 1414       Component Value Date/Time   WBC 13.6 (H) 06/30/2020 1516   RBC 4.61 06/30/2020 1516   HGB 14.8 06/30/2020 1516   HCT 43.7 06/30/2020 1516   PLT 288.0 06/30/2020 1516   MCV 94.7 06/30/2020 1516   MCV 94.4 07/14/2014 1524   MCH 30.4 07/31/2018 1414   MCHC 33.8 06/30/2020 1516   RDW 13.6 06/30/2020 1516   LYMPHSABS 1.9 06/30/2020 1516   MONOABS 1.0 06/30/2020 1516   EOSABS 0.1 06/30/2020 1516   BASOSABS 0.1 06/30/2020 1516    No results found for: POCLITH, LITHIUM   No results found for: PHENYTOIN, PHENOBARB, VALPROATE, CBMZ   .res Assessment: Plan:    Plan:  D/C Cymbalta 20mg  daily  Xanax 0.25mg  daily PRN anxiety - uses occasionally  L-methylfolate   RTC 4 weeks  Working with therapist - .   Out of work currently - does not feel ready to return to work with mental health issues and physical disabilities - currently on LTD through employer.   Multiple failures with SSRI, SNRI, and Wellbutrin - allergic reactions.  Discussed potential benefits, risk, and side effects of benzodiazepines to include potential risk of tolerance and dependence, as well as possible drowsiness.  Advised patient not to drive if experiencing drowsiness and to take lowest possible effective dose to minimize risk of dependence and tolerance.  Discussed potential benefits, risks, and side effects of stimulants with patient to include increased heart rate, palpitations, insomnia, increased anxiety, increased irritability, or decreased appetite.  Instructed patient to contact office if experiencing any significant tolerability issues.  Diagnoses and all orders for this visit:  Generalized anxiety disorder  Panic attacks -      ALPRAZolam (XANAX) 0.25 MG tablet; Take 1/2 to one tablet daily as needed for anxiety/panic attacks.  Insomnia, unspecified type -     ALPRAZolam (XANAX) 0.25 MG tablet; Take 1/2 to one tablet daily as needed for anxiety/panic attacks.  PTSD (post-traumatic stress disorder)  Attention deficit hyperactivity disorder (ADHD), unspecified ADHD type -     ALPRAZolam (XANAX) 0.25 MG tablet; Take 1/2 to one tablet daily as needed for anxiety/panic attacks.    Please see After Visit Summary for patient specific instructions.  Future Appointments  Date Time Provider Department Center  03/03/2021 11:00 AM [place], 03/05/2021, PA-C [id number]    No orders of the defined types were placed in this encounter.     -------------------------------

## 2021-01-14 ENCOUNTER — Encounter: Payer: Self-pay | Admitting: Adult Health

## 2021-01-14 ENCOUNTER — Telehealth (INDEPENDENT_AMBULATORY_CARE_PROVIDER_SITE_OTHER): Payer: 59 | Admitting: Adult Health

## 2021-01-14 DIAGNOSIS — F39 Unspecified mood [affective] disorder: Secondary | ICD-10-CM

## 2021-01-14 DIAGNOSIS — G4700 Insomnia, unspecified: Secondary | ICD-10-CM

## 2021-01-14 DIAGNOSIS — F909 Attention-deficit hyperactivity disorder, unspecified type: Secondary | ICD-10-CM

## 2021-01-14 DIAGNOSIS — F410 Panic disorder [episodic paroxysmal anxiety] without agoraphobia: Secondary | ICD-10-CM | POA: Diagnosis not present

## 2021-01-14 DIAGNOSIS — F411 Generalized anxiety disorder: Secondary | ICD-10-CM

## 2021-01-14 DIAGNOSIS — F4310 Post-traumatic stress disorder, unspecified: Secondary | ICD-10-CM

## 2021-01-14 NOTE — Progress Notes (Signed)
Abigail White [id number] 1980-12-22 40 y.o.  Virtual Visit via Video Note  I connected with pt @ on 01/14/21 at 10:40 AM EDT by a video enabled telemedicine application and verified that I am speaking with the correct person using two identifiers.   I discussed the limitations of evaluation and management by telemedicine and the availability of in person appointments. The patient expressed understanding and agreed to proceed.  I discussed the assessment and treatment plan with the patient. The patient was provided an opportunity to ask questions and all were answered. The patient agreed with the plan and demonstrated an understanding of the instructions.   The patient was advised to call back or seek an in-person evaluation if the symptoms worsen or if the condition fails to improve as anticipated.  I provided 25 minutes of non-face-to-face time during this encounter.  The patient was located at home.  The provider was located at [facility].   Abigail Gibbs, NP   Subjective:   Patient ID:  Abigail White is a 40 y.o. (DOB 1981/04/29) female.  Chief Complaint: No chief complaint on file.   HPI JODE LIPPE presents for follow-up of GAD, MDD, ADHD, insomnia, panic attacks, PTSD.  Describes mood today as "not good". Pleasant. Tearful at times. Pleasant. Mood symptoms - reports depression, anxiety, and irritability. Reports panic attacks. Stating "things are crazy right now". She and husband having marital issues. Reports DSS involvement. She and children are safe and staying at her brothers for the next week and then plans to return home. Trying to focus on taking care of children and herself. Children involved in counseling. Receiving long term disability benefits through previous employer. Increased physical symptoms from Fibromyalgia. Varying interest and motivation. Taking medications as prescribed. Seeing therapist weekly - Isiah Collins. Energy levels low -  pushing herself. Active, does not have a regular exercise routine.  Enjoys some usual interests and activities. Married - separated. Lives with 3 childen 82 y/o son, 57 year old son, and 83 y/o daughter and dog.  Extended family local. Appetite adequate. Weight loss and then gained it back - 155 pounds.  Sleeping difficulties - "it's spotty". Averages 3 to 7 hours. Focus and concentration difficulties. Completing tasks. Managing some aspects of household. Out of work currently - does not feel she is ready to return to work setting. Denies SI or HI.  Denies AH or VH.  Review of Systems:  Review of Systems  Musculoskeletal:  Negative for gait problem.  Neurological:  Negative for tremors.  Psychiatric/Behavioral:         Please refer to HPI   Medications: I have reviewed the patient's current medications.  Current Outpatient Medications  Medication Sig Dispense Refill   Acetaminophen (TYLENOL PO) Take by mouth as needed.     albuterol (PROAIR HFA) 108 (90 Base) MCG/ACT inhaler Inhale 2 puffs into the lungs every 6 (six) hours as needed. 18 g 2   ALPRAZolam (XANAX) 0.25 MG tablet Take 1/2 to one tablet daily as needed for anxiety/panic attacks. 30 tablet 2   benzonatate (TESSALON PERLES) 100 MG capsule Take 1 capsule (100 mg total) by mouth 3 (three) times daily as needed for cough. 20 capsule 0   butalbital-acetaminophen-caffeine (FIORICET) 50-325-40 MG tablet Take by mouth 2 (two) times daily as needed for headache.     cyclobenzaprine (FLEXERIL) 10 MG tablet Take 10 mg by mouth as needed.     DULoxetine (CYMBALTA) 20 MG capsule TAKE 1 CAPSULE BY  MOUTH EVERY DAY (Patient not taking: Reported on 11/29/2020) 90 capsule 0   fluticasone (FLOVENT HFA) 110 MCG/ACT inhaler TAKE 2 PUFFS BY MOUTH TWICE A DAY 12 g 2   ibuprofen (ADVIL) 800 MG tablet Take 1 tablet (800 mg total) by mouth every 8 (eight) hours as needed for cramping. 90 tablet 0   ondansetron (ZOFRAN) 4 MG tablet Take 1 tablet (4 mg  total) by mouth every 8 (eight) hours as needed for nausea or vomiting. 20 tablet 0   valACYclovir (VALTREX) 500 MG tablet Take 1 tablet as needed for outbreak     No current facility-administered medications for this visit.    Medication Side Effects: None  Allergies:  Allergies  Allergen Reactions   Effexor [Venlafaxine]     Multiple somatic issues   Lexapro [Escitalopram Oxalate]     Mania    Paxil [Paroxetine Hcl]     Feels detached    Penicillins Hives    Has patient had a PCN reaction causing immediate rash, facial/tongue/throat swelling, SOB or lightheadedness with hypotension: no Has patient had a PCN reaction causing severe rash involving mucus membranes or skin necrosis: yes Has patient had a PCN reaction that required hospitalization no Has patient had a PCN reaction occurring within the last 10 years: no If all of the above answers are "NO", then may proceed with Cephalosporin use.    Sulfa Antibiotics    Vicodin [Hydrocodone-Acetaminophen] Nausea And Vomiting    Projectile vomiting   Wellbutrin [Bupropion]     Became belligerant   Zoloft [Sertraline Hcl] Hives    rash   Differin [Adapalene] Rash   Doxycycline Rash   Lamictal [Lamotrigine] Rash   Latex Rash    Past Medical History:  Diagnosis Date   ADHD (attention deficit hyperactivity disorder)    Allergy    Anemia    Anxiety    H/O   Carpal tunnel syndrome    bilateral   Chest pain    H/O - no current problems - anxiety related   Chronic fatigue    Depression    Gestational   Dyspnea    GERD (gastroesophageal reflux disease)    History of chickenpox    HSV (herpes simplex virus) infection    Hx of pyelonephritis    Migraine    Ovarian cyst    Palpitations    H/O - no current problems-anxiety related   PONV (postoperative nausea and vomiting)    Postpartum care following vaginal delivery (2/13) 06/21/2016   PTSD (post-traumatic stress disorder)    Smoker    Spontaneous vaginal delivery  06/21/2016   svd x 3   Vaginal Pap smear, abnormal     Family History  Problem Relation Age of Onset   Coronary artery disease Mother 1955   Hypertension Mother    Diabetes Mother    Heart disease Mother    Kidney Stones Mother    Asthma Mother    Kidney disease Mother    Hyperthyroidism Mother    COPD Mother    Heart attack Mother    Dementia Mother    Hyperlipidemia Mother    Arthritis Mother    Fibromyalgia Mother    Obesity Mother    Other Father        Shot-violence   Alcohol abuse Father    Hypertension Brother    Kidney Stones Brother    Hyperlipidemia Brother    COPD Brother    Heart attack Brother    Diabetes Brother  Drug abuse Brother    Hypertension Brother    High Cholesterol Brother    Obesity Brother    Obesity Brother    Obesity Brother    Hypertension Maternal Grandmother    Hyperlipidemia Maternal Grandmother    Cancer Maternal Grandmother        Skin   COPD Maternal Grandmother    Diabetes Maternal Grandmother    Cancer Maternal Grandfather        Bone   Cancer Paternal Grandmother    Stroke Paternal Grandmother    Aneurysm Paternal Grandfather    Hypertension Other    Diabetes Other    Asthma Son    Hypertension Maternal Aunt    COPD Maternal Aunt    Diabetes Maternal Aunt    Heart attack Maternal Aunt    Cancer Paternal Aunt        Breast   Asthma Son    Asthma Daughter     Social History   Socioeconomic History   Marital status: Married    Spouse name: Not on file   Number of children: 3   Years of education: 4   Highest education level: Not on file  Occupational History   Occupation: nusre     Associate Professor: OTHER  Tobacco Use   Smoking status: Every Day    Packs/day: 1.00    Years: 20.00    Pack years: 20.00    Types: Cigarettes   Smokeless tobacco: Never  Vaping Use   Vaping Use: Never used  Substance and Sexual Activity   Alcohol use: Not Currently   Drug use: No   Sexual activity: Yes  Other Topics Concern    Not on file  Social History Narrative   Not on file   Social Determinants of Health   Financial Resource Strain: Not on file  Food Insecurity: Not on file  Transportation Needs: Not on file  Physical Activity: Not on file  Stress: Not on file  Social Connections: Not on file  Intimate Partner Violence: Not on file    Past Medical History, Surgical history, Social history, and Family history were reviewed and updated as appropriate.   Please see review of systems for further details on the patient's review from today.   Objective:   Physical Exam:  There were no vitals taken for this visit.  Physical Exam Constitutional:      General: She is not in acute distress. Musculoskeletal:        General: No deformity.  Neurological:     Mental Status: She is alert and oriented to person, place, and time.     Coordination: Coordination normal.  Psychiatric:        Attention and Perception: Attention and perception normal. She does not perceive auditory or visual hallucinations.        Mood and Affect: Mood normal. Mood is not anxious or depressed. Affect is not labile, blunt, angry or inappropriate.        Speech: Speech normal.        Behavior: Behavior normal.        Thought Content: Thought content normal. Thought content is not paranoid or delusional. Thought content does not include homicidal or suicidal ideation. Thought content does not include homicidal or suicidal plan.        Cognition and Memory: Cognition and memory normal.        Judgment: Judgment normal.     Comments: Insight intact    Lab Review:  Component Value Date/Time   NA 138 06/30/2020 1516   K 4.6 06/30/2020 1516   CL 106 06/30/2020 1516   CO2 25 06/30/2020 1516   GLUCOSE 79 06/30/2020 1516   BUN 13 06/30/2020 1516   CREATININE 0.72 06/30/2020 1516   CREATININE 0.67 11/26/2013 1604   CALCIUM 9.8 06/30/2020 1516   PROT 6.3 09/07/2020 1123   ALBUMIN 4.4 06/30/2020 1516   AST 18 06/30/2020 1516    ALT 16 06/30/2020 1516   ALKPHOS 48 06/30/2020 1516   BILITOT 0.4 06/30/2020 1516   GFRNONAA >60 07/31/2018 1414   GFRAA >60 07/31/2018 1414       Component Value Date/Time   WBC 13.6 (H) 06/30/2020 1516   RBC 4.61 06/30/2020 1516   HGB 14.8 06/30/2020 1516   HCT 43.7 06/30/2020 1516   PLT 288.0 06/30/2020 1516   MCV 94.7 06/30/2020 1516   MCV 94.4 07/14/2014 1524   MCH 30.4 07/31/2018 1414   MCHC 33.8 06/30/2020 1516   RDW 13.6 06/30/2020 1516   LYMPHSABS 1.9 06/30/2020 1516   MONOABS 1.0 06/30/2020 1516   EOSABS 0.1 06/30/2020 1516   BASOSABS 0.1 06/30/2020 1516    No results found for: POCLITH, LITHIUM   No results found for: PHENYTOIN, PHENOBARB, VALPROATE, CBMZ   .res Assessment: Plan:    Plan:  D/C Cymbalta 20mg  daily - tried a second time and had similar results  Xanax 0.25mg  daily PRN anxiety - uses occasionally  L-methylfolate   RTC 4 weeks  Working with therapist - .   Out of work currently - does not feel ready to return to work with mental health issues and physical disabilities - currently on LTD through employer.   Multiple failures with SSRI, SNRI, and Wellbutrin - allergic reactions.  Discussed potential benefits, risk, and side effects of benzodiazepines to include potential risk of tolerance and dependence, as well as possible drowsiness. Advised patient not to drive if experiencing drowsiness and to take lowest possible effective dose to minimize risk of dependence and tolerance.  Discussed potential benefits, risks, and side effects of stimulants with patient to include increased heart rate, palpitations, insomnia, increased anxiety, increased irritability, or decreased appetite.  Instructed patient to contact office if experiencing any significant tolerability issues.   Diagnoses and all orders for this visit:  Generalized anxiety disorder  Panic attacks  Insomnia, unspecified type  PTSD (post-traumatic stress  disorder)  Episodic mood disorder (HCC)  Attention deficit hyperactivity disorder (ADHD), unspecified ADHD type    Please see After Visit Summary for patient specific instructions.  Future Appointments  Date Time Provider Department Center  03/03/2021 11:00 AM [place], 03/05/2021, PA-C [facility]    No orders of the defined types were placed in this encounter.     -------------------------------

## 2021-02-10 ENCOUNTER — Encounter: Payer: Self-pay | Admitting: Physician Assistant

## 2021-02-10 ENCOUNTER — Telehealth (INDEPENDENT_AMBULATORY_CARE_PROVIDER_SITE_OTHER): Payer: 59 | Admitting: Physician Assistant

## 2021-02-10 VITALS — BP 108/62 | HR 95 | Temp 98.9°F | Ht 66.75 in

## 2021-02-10 DIAGNOSIS — M2550 Pain in unspecified joint: Secondary | ICD-10-CM | POA: Diagnosis not present

## 2021-02-10 DIAGNOSIS — F17210 Nicotine dependence, cigarettes, uncomplicated: Secondary | ICD-10-CM | POA: Diagnosis not present

## 2021-02-10 DIAGNOSIS — M797 Fibromyalgia: Secondary | ICD-10-CM | POA: Diagnosis not present

## 2021-02-10 DIAGNOSIS — F419 Anxiety disorder, unspecified: Secondary | ICD-10-CM

## 2021-02-10 DIAGNOSIS — G8929 Other chronic pain: Secondary | ICD-10-CM

## 2021-02-10 DIAGNOSIS — F32A Depression, unspecified: Secondary | ICD-10-CM

## 2021-02-10 MED ORDER — IBUPROFEN 800 MG PO TABS: 800.0000 mg | ORAL_TABLET | Freq: Three times a day (TID) | ORAL | 0 refills | Status: DC | PRN

## 2021-02-10 NOTE — Progress Notes (Signed)
Virtual Visit via Video Note  I connected with  Abigail White  on 02/10/21 at 11:30 AM EDT by a video enabled telemedicine application and verified that I am speaking with the correct person using two identifiers.  Location: Patient: home Provider: Nature conservation officer at [employer] Persons present: Patient and myself   I discussed the limitations of evaluation and management by telemedicine and the availability of in person appointments. The patient expressed understanding and agreed to proceed.   History of Present Illness: Patient states that things are crazy right now. Last spoke to husband on 12/30/20. DSS is involved. She has taken out a restraining order. They go to court on Monday. 63 & 40 year old boys at home from previous marriage. Had a 9 year old daughter with her husband.  Currently going through Domestic Violence courses, which she says are good but stressing her. She feels like she is grieving, but overall knows things are moving in the right direction.   She is still seeing her counselor & psych NP. She quit smoking.   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    Observations/Objective:   Gen: Awake, alert, no acute distress Resp: Breathing is even and non-labored Psych: calm/pleasant demeanor; tearful at times Neuro: Alert and Oriented x 3, + facial symmetry, speech is clear.   Assessment and Plan:  1. Fibromyalgia 2. Chronic pain of multiple joints 3. Anxiety and depression She continues to see behavioral health.  She states that her fibromyalgia and chronic pain are significantly improved since separating from her husband. She is currently only taking Xanax 0.25 mg prn from psych, otherwise no other medications for these issues. She could not tolerate Cymbalta.  4. Cigarette nicotine dependence without complication I congratulated her on quitting smoking.  She is very motivated to continue to stay smoke-free and does not want any  additional help at this time.   Follow Up Instructions:    I discussed the assessment and treatment plan with the patient. The patient was provided an opportunity to ask questions and all were answered. The patient agreed with the plan and demonstrated an understanding of the instructions.   The patient was advised to call back or seek an in-person evaluation if the symptoms worsen or if the condition fails to improve as anticipated.  Ashwini Jago M Tascha Casares, PA-C

## 2021-02-18 ENCOUNTER — Encounter: Payer: Self-pay | Admitting: Adult Health

## 2021-02-18 ENCOUNTER — Other Ambulatory Visit: Payer: Self-pay

## 2021-02-18 ENCOUNTER — Ambulatory Visit (INDEPENDENT_AMBULATORY_CARE_PROVIDER_SITE_OTHER): Payer: 59 | Admitting: Adult Health

## 2021-02-18 DIAGNOSIS — G4700 Insomnia, unspecified: Secondary | ICD-10-CM | POA: Diagnosis not present

## 2021-02-18 DIAGNOSIS — F410 Panic disorder [episodic paroxysmal anxiety] without agoraphobia: Secondary | ICD-10-CM

## 2021-02-18 DIAGNOSIS — F909 Attention-deficit hyperactivity disorder, unspecified type: Secondary | ICD-10-CM

## 2021-02-18 DIAGNOSIS — F4310 Post-traumatic stress disorder, unspecified: Secondary | ICD-10-CM

## 2021-02-18 DIAGNOSIS — F411 Generalized anxiety disorder: Secondary | ICD-10-CM | POA: Diagnosis not present

## 2021-02-18 MED ORDER — ALPRAZOLAM 0.25 MG PO TABS: ORAL_TABLET | ORAL | 2 refills | Status: DC

## 2021-02-18 MED ORDER — AMPHETAMINE-DEXTROAMPHETAMINE 5 MG PO TABS: 5.0000 mg | ORAL_TABLET | Freq: Every day | ORAL | 0 refills | Status: DC

## 2021-02-18 NOTE — Progress Notes (Signed)
[name] [id number] 08/05/1980 40 y.o.  Subjective:   Patient ID:  Abigail White is a 40 y.o. (DOB [birth year]) female.  Chief Complaint: No chief complaint on file.   HPI Abigail White presents to the office today for follow-up of GAD, MDD, ADHD, insomnia, panic attacks, PTSD.  Describes mood today as "ok". Pleasant. Tearful at times. Pleasant. Mood symptoms - reports depression, anxiety, and irritability. Reports panic attacks. Stating "I'm doing alright". Remains separated from husband. Reports DSS has opened a case. 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 Varying interest and motivation. Taking medications as prescribed. Seeing therapist weekly - Isiah Collins. Energy levels stable. Active, does not have a regular exercise routine.  Enjoys some usual interests and activities. Married - separated. Lives with 3 childen 65 y/o son, 23 year old son, and 24 y/o daughter and dog. Extended family local. Appetite adequate. Weight 155 pounds.  Sleeping difficulties. Averages 4 to 6 hours. Focus and concentration difficulties. Completing tasks. Managing aspects of household. Recently got a job with [employer] - on 10-24.   Denies SI or HI.  Denies AH or VH.   GAD-7    Flowsheet Row Office Visit from 08/28/2017 in [facility] Office Visit from 11/03/2016 in [facility]  Total GAD-7 Score 18 14      PHQ2-9    Flowsheet Row Office Visit from 01/20/2020 in [facility] Office Visit from 12/13/2018 in [facility] Office Visit from 08/28/2017 in [facility] Office Visit from 07/24/2017 in [facility] Office Visit from 11/03/2016 in [facility]  PHQ-2 Total Score 1 3 4 2  0  PHQ-9  Total Score 5 12 12 3  0        Review of Systems:  Review of Systems  Musculoskeletal:  Negative for gait problem.  Neurological:  Negative for tremors.  Psychiatric/Behavioral:         Please refer to HPI   Medications: I have reviewed the patient's current medications.  Current Outpatient Medications  Medication Sig Dispense Refill   amphetamine-dextroamphetamine (ADDERALL) 5 MG tablet Take 1 tablet (5 mg total) by mouth daily. 30 tablet 0   Acetaminophen (TYLENOL PO) Take by mouth as needed.     albuterol (PROAIR HFA) 108 (90 Base) MCG/ACT inhaler Inhale 2 puffs into the lungs every 6 (six) hours as needed. 18 g 2   ALPRAZolam (XANAX) 0.25 MG tablet Take 1/2 to one tablet daily as needed for anxiety/panic attacks. 30 tablet 2   benzonatate (TESSALON PERLES) 100 MG capsule Take 1 capsule (100 mg total) by mouth 3 (three) times daily as needed for cough. 20 capsule 0   butalbital-acetaminophen-caffeine (FIORICET) 50-325-40 MG tablet Take by mouth 2 (two) times daily as needed for headache.     cyclobenzaprine (FLEXERIL) 10 MG tablet Take 10 mg by mouth as needed.     fluticasone (FLOVENT HFA) 110 MCG/ACT inhaler TAKE 2 PUFFS BY MOUTH TWICE A DAY 12 g 2   ibuprofen (ADVIL) 800 MG tablet Take 1 tablet (800 mg total) by mouth every 8 (eight) hours as needed for cramping. 90 tablet 0   ondansetron (ZOFRAN) 4 MG tablet Take 1 tablet (4 mg total) by mouth every 8 (eight) hours as needed for nausea or vomiting. 20 tablet 0   valACYclovir (VALTREX) 500 MG tablet Take 1 tablet as needed  for outbreak     No current facility-administered medications for this visit.    Medication Side Effects: None  Allergies:  Allergies  Allergen Reactions   Effexor [Venlafaxine]     Multiple somatic issues   Lexapro [Escitalopram Oxalate]     Mania    Paxil [Paroxetine Hcl]     Feels detached    Penicillins Hives    Has patient had a PCN reaction causing immediate rash, facial/tongue/throat  swelling, SOB or lightheadedness with hypotension: no Has patient had a PCN reaction causing severe rash involving mucus membranes or skin necrosis: yes Has patient had a PCN reaction that required hospitalization no Has patient had a PCN reaction occurring within the last 10 years: no If all of the above answers are "NO", then may proceed with Cephalosporin use.    Sulfa Antibiotics    Vicodin [Hydrocodone-Acetaminophen] Nausea And Vomiting    Projectile vomiting   Wellbutrin [Bupropion]     Became belligerant   Zoloft [Sertraline Hcl] Hives    rash   Differin [Adapalene] Rash   Doxycycline Rash   Lamictal [Lamotrigine] Rash   Latex Rash    Past Medical History:  Diagnosis Date   ADHD (attention deficit hyperactivity disorder)    Allergy    Anemia    Anxiety    H/O   Carpal tunnel syndrome    bilateral   Chest pain    H/O - no current problems - anxiety related   Chronic fatigue    Depression    Gestational   Dyspnea    GERD (gastroesophageal reflux disease)    History of chickenpox    HSV (herpes simplex virus) infection    Hx of pyelonephritis    Migraine    Ovarian cyst    Palpitations    H/O - no current problems-anxiety related   PONV (postoperative nausea and vomiting)    Postpartum care following vaginal delivery (2/13) 06/21/2016   PTSD (post-traumatic stress disorder)    Smoker    Spontaneous vaginal delivery 06/21/2016   svd x 3   Vaginal Pap smear, abnormal     Past Medical History, Surgical history, Social history, and Family history were reviewed and updated as appropriate.   Please see review of systems for further details on the patient's review from today.   Objective:   Physical Exam:  There were no vitals taken for this visit.  Physical Exam Constitutional:      General: She is not in acute distress. Musculoskeletal:        General: No deformity.  Neurological:     Mental Status: She is alert and oriented to person, place, and time.      Coordination: Coordination normal.  Psychiatric:        Attention and Perception: Attention and perception normal. She does not perceive auditory or visual hallucinations.        Mood and Affect: Mood normal. Mood is not anxious or depressed. Affect is not labile, blunt, angry or inappropriate.        Speech: Speech normal.        Behavior: Behavior normal.        Thought Content: Thought content normal. Thought content is not paranoid or delusional. Thought content does not include homicidal or suicidal ideation. Thought content does not include homicidal or suicidal plan.        Cognition and Memory: Cognition and memory normal.        Judgment: Judgment normal.  Comments: Insight intact    Lab Review:     Component Value Date/Time   NA 138 06/30/2020 1516   K 4.6 06/30/2020 1516   CL 106 06/30/2020 1516   CO2 25 06/30/2020 1516   GLUCOSE 79 06/30/2020 1516   BUN 13 06/30/2020 1516   CREATININE 0.72 06/30/2020 1516   CREATININE 0.67 11/26/2013 1604   CALCIUM 9.8 06/30/2020 1516   PROT 6.3 09/07/2020 1123   ALBUMIN 4.4 06/30/2020 1516   AST 18 06/30/2020 1516   ALT 16 06/30/2020 1516   ALKPHOS 48 06/30/2020 1516   BILITOT 0.4 06/30/2020 1516   GFRNONAA >60 07/31/2018 1414   GFRAA >60 07/31/2018 1414       Component Value Date/Time   WBC 13.6 (H) 06/30/2020 1516   RBC 4.61 06/30/2020 1516   HGB 14.8 06/30/2020 1516   HCT 43.7 06/30/2020 1516   PLT 288.0 06/30/2020 1516   MCV 94.7 06/30/2020 1516   MCV 94.4 07/14/2014 1524   MCH 30.4 07/31/2018 1414   MCHC 33.8 06/30/2020 1516   RDW 13.6 06/30/2020 1516   LYMPHSABS 1.9 06/30/2020 1516   MONOABS 1.0 06/30/2020 1516   EOSABS 0.1 06/30/2020 1516   BASOSABS 0.1 06/30/2020 1516    No results found for: POCLITH, LITHIUM   No results found for: PHENYTOIN, PHENOBARB, VALPROATE, CBMZ   .res Assessment: Plan:     Plan:  Xanax 0.25mg  daily PRN anxiety - uses occasionally - UDS requested - lab slip  given.  L-methylfolate   RTC 4 weeks  Working with therapist - Tami Ribas.   Multiple medication failures with SSRI, SNRI, and Wellbutrin - allergic reactions.  Discussed potential benefits, risk, and side effects of benzodiazepines to include potential risk of tolerance and dependence, as well as possible drowsiness. Advised patient not to drive if experiencing drowsiness and to take lowest possible effective dose to minimize risk of dependence and tolerance.  Diagnoses and all orders for this visit:  Generalized anxiety disorder -     ALPRAZolam (XANAX) 0.25 MG tablet; Take 1/2 to one tablet daily as needed for anxiety/panic attacks. -     Urine drugs of abuse scrn w alc, routine (Ref Lab)  Panic attacks -     ALPRAZolam (XANAX) 0.25 MG tablet; Take 1/2 to one tablet daily as needed for anxiety/panic attacks.  Insomnia, unspecified type -     ALPRAZolam (XANAX) 0.25 MG tablet; Take 1/2 to one tablet daily as needed for anxiety/panic attacks.  Attention deficit hyperactivity disorder (ADHD), unspecified ADHD type -     amphetamine-dextroamphetamine (ADDERALL) 5 MG tablet; Take 1 tablet (5 mg total) by mouth daily.  PTSD (post-traumatic stress disorder) -     ALPRAZolam (XANAX) 0.25 MG tablet; Take 1/2 to one tablet daily as needed for anxiety/panic attacks.    Please see After Visit Summary for patient specific instructions.  No future appointments.  Orders Placed This Encounter  Procedures   Urine drugs of abuse scrn w alc, routine (Ref Lab)    -------------------------------

## 2021-02-20 LAB — URINE DRUGS OF ABUSE SCREEN W ALC, ROUTINE (REF LAB)
Amphetamines, Urine: NEGATIVE ng/mL
Barbiturate Quant, Ur: NEGATIVE ng/mL
Benzodiazepine Quant, Ur: NEGATIVE ng/mL
Cannabinoid Quant, Ur: NEGATIVE ng/mL
Cocaine (Metab.): NEGATIVE ng/mL
Ethanol, Urine: NEGATIVE %
Methadone Screen, Urine: NEGATIVE ng/mL
Opiate Quant, Ur: NEGATIVE ng/mL
PCP Quant, Ur: NEGATIVE ng/mL
Propoxyphene: NEGATIVE ng/mL

## 2021-03-01 ENCOUNTER — Encounter: Payer: Self-pay | Admitting: Physician Assistant

## 2021-03-03 ENCOUNTER — Telehealth: Payer: 59 | Admitting: Physician Assistant

## 2021-03-03 ENCOUNTER — Other Ambulatory Visit: Payer: Self-pay | Admitting: Physician Assistant

## 2021-11-09 LAB — HM PAP SMEAR: HM Pap smear: NEGATIVE

## 2021-11-09 LAB — HM MAMMOGRAPHY

## 2021-11-15 ENCOUNTER — Encounter: Payer: Self-pay | Admitting: Physician Assistant

## 2021-11-15 ENCOUNTER — Other Ambulatory Visit: Payer: Self-pay | Admitting: Physician Assistant

## 2021-11-15 ENCOUNTER — Ambulatory Visit: Payer: 59 | Admitting: Physician Assistant

## 2021-11-15 ENCOUNTER — Other Ambulatory Visit: Payer: Self-pay

## 2021-11-15 VITALS — BP 102/68 | HR 94 | Temp 98.7°F | Ht 66.93 in | Wt 165.2 lb

## 2021-11-15 DIAGNOSIS — E7800 Pure hypercholesterolemia, unspecified: Secondary | ICD-10-CM

## 2021-11-15 DIAGNOSIS — R42 Dizziness and giddiness: Secondary | ICD-10-CM | POA: Diagnosis not present

## 2021-11-15 DIAGNOSIS — E559 Vitamin D deficiency, unspecified: Secondary | ICD-10-CM | POA: Diagnosis not present

## 2021-11-15 DIAGNOSIS — E010 Iodine-deficiency related diffuse (endemic) goiter: Secondary | ICD-10-CM | POA: Diagnosis not present

## 2021-11-15 DIAGNOSIS — Z0000 Encounter for general adult medical examination without abnormal findings: Secondary | ICD-10-CM

## 2021-11-15 DIAGNOSIS — M797 Fibromyalgia: Secondary | ICD-10-CM

## 2021-11-15 LAB — CBC WITH DIFFERENTIAL/PLATELET
Basophils Absolute: 0.1 10*3/uL (ref 0.0–0.1)
Basophils Relative: 0.7 % (ref 0.0–3.0)
Eosinophils Absolute: 0.1 10*3/uL (ref 0.0–0.7)
Eosinophils Relative: 1.4 % (ref 0.0–5.0)
HCT: 44 % (ref 36.0–46.0)
Hemoglobin: 14.9 g/dL (ref 12.0–15.0)
Lymphocytes Relative: 19.3 % (ref 12.0–46.0)
Lymphs Abs: 1.4 10*3/uL (ref 0.7–4.0)
MCHC: 33.8 g/dL (ref 30.0–36.0)
MCV: 94 fl (ref 78.0–100.0)
Monocytes Absolute: 0.5 10*3/uL (ref 0.1–1.0)
Monocytes Relative: 6.7 % (ref 3.0–12.0)
Neutro Abs: 5.3 10*3/uL (ref 1.4–7.7)
Neutrophils Relative %: 71.9 % (ref 43.0–77.0)
Platelets: 300 10*3/uL (ref 150.0–400.0)
RBC: 4.68 Mil/uL (ref 3.87–5.11)
RDW: 13.2 % (ref 11.5–15.5)
WBC: 7.4 10*3/uL (ref 4.0–10.5)

## 2021-11-15 LAB — COMPREHENSIVE METABOLIC PANEL
ALT: 10 U/L (ref 0–35)
AST: 15 U/L (ref 0–37)
Albumin: 4.4 g/dL (ref 3.5–5.2)
Alkaline Phosphatase: 62 U/L (ref 39–117)
BUN: 9 mg/dL (ref 6–23)
CO2: 23 mEq/L (ref 19–32)
Calcium: 9.2 mg/dL (ref 8.4–10.5)
Chloride: 105 mEq/L (ref 96–112)
Creatinine, Ser: 0.71 mg/dL (ref 0.40–1.20)
GFR: 106.13 mL/min (ref >60.00)
Glucose, Bld: 89 mg/dL (ref 70–99)
Potassium: 3.8 mEq/L (ref 3.5–5.1)
Sodium: 137 mEq/L (ref 135–145)
Total Bilirubin: 0.6 mg/dL (ref 0.2–1.2)
Total Protein: 7 g/dL (ref 6.0–8.3)

## 2021-11-15 LAB — LIPID PANEL
Cholesterol: 213 mg/dL — ABNORMAL HIGH (ref 0–200)
HDL: 37.7 mg/dL — ABNORMAL LOW (ref >39.00)
LDL Cholesterol: 155 mg/dL — ABNORMAL HIGH (ref 0–99)
NonHDL: 175.46
Total CHOL/HDL Ratio: 6
Triglycerides: 100 mg/dL (ref 0.0–149.0)
VLDL: 20 mg/dL (ref 0.0–40.0)

## 2021-11-15 LAB — POC URINALSYSI DIPSTICK (AUTOMATED)
Bilirubin, UA: NEGATIVE
Blood, UA: NEGATIVE
Glucose, UA: NEGATIVE
Ketones, UA: NEGATIVE
Leukocytes, UA: NEGATIVE
Nitrite, UA: NEGATIVE
Protein, UA: NEGATIVE
Spec Grav, UA: 1.015 (ref 1.010–1.025)
Urobilinogen, UA: 0.2 E.U./dL
pH, UA: 6.5 (ref 5.0–8.0)

## 2021-11-15 LAB — HEMOGLOBIN A1C: Hgb A1c MFr Bld: 5.5 % (ref 4.6–6.5)

## 2021-11-15 LAB — VITAMIN D 25 HYDROXY (VIT D DEFICIENCY, FRACTURES): VITD: 19.44 ng/mL — ABNORMAL LOW (ref 30.00–100.00)

## 2021-11-15 MED ORDER — METHOCARBAMOL 500 MG PO TABS
500.0000 mg | ORAL_TABLET | Freq: Four times a day (QID) | ORAL | 0 refills | Status: DC | PRN
Start: 2021-11-15 — End: 2022-11-16

## 2021-11-15 MED ORDER — VALACYCLOVIR HCL 500 MG PO TABS: 500.0000 mg | ORAL_TABLET | ORAL | 2 refills | Status: DC | PRN

## 2021-11-15 MED ORDER — IBUPROFEN 800 MG PO TABS: 800.0000 mg | ORAL_TABLET | Freq: Three times a day (TID) | ORAL | 0 refills | Status: DC | PRN

## 2021-11-15 MED ORDER — PREDNISONE 5 MG PO TABS: 5.0000 mg | ORAL_TABLET | Freq: Two times a day (BID) | ORAL | 0 refills | Status: DC

## 2021-11-15 MED ORDER — BUTALBITAL-APAP-CAFFEINE 50-325-40 MG PO TABS
2.0000 | ORAL_TABLET | Freq: Two times a day (BID) | ORAL | 0 refills | Status: DC | PRN
  Filled 2021-11-15: qty 14, 4d supply, fill #0

## 2021-11-15 MED ORDER — BLOOD GLUCOSE MONITOR KIT: PACK | 0 refills | Status: DC

## 2021-11-15 NOTE — Telephone Encounter (Signed)
Please see medication request.

## 2021-11-15 NOTE — Progress Notes (Signed)
Subjective:    Patient ID: Abigail White, female    DOB: 08-Jun-1980, 41 y.o.   MRN: [id number]  Chief Complaint  Patient presents with   Annual Exam    Pt coming in for annual CPE; pt is fasting; pt has some dizzy spells; requesting a meter to check sugar when getting these to keep an eye on glucose; hx of gestational diabetes and diabetes in family; pt c/o pain all over; recently dx fibromyalgia;    HPI Patient is in today for annual exam.  Acute concerns: Intermittent dizzy - sometimes thirsty, weak feeling; stopped by fire dept and sugar was 95 - requesting glucometer to have at home; diabetes runs in family and she's concerned about developing this  Pain all over - fibromyalgia flaring up  Health maintenance: Lifestyle/ exercise: No exercise  Nutrition: Doing well with water; eating could be better, a lot of fast food recently Mental health: Doing better, still seeing counselor Sleep: Restless, disturbed by 66 yo and husband at night Substance use: None ETOH: None Sexual activity: Monogamous, married Immunizations: UTD Pap: UTD with Dr. Ronita Hipps Mammogram: UTD    Past Medical History:  Diagnosis Date   ADHD (attention deficit hyperactivity disorder)    Allergy    Anemia    Anxiety    H/O   Carpal tunnel syndrome    bilateral   Chest pain    H/O - no current problems - anxiety related   Chronic fatigue    Depression    Gestational   Dyspnea    GERD (gastroesophageal reflux disease)    History of chickenpox    HSV (herpes simplex virus) infection    Hx of pyelonephritis    Migraine    Ovarian cyst    Palpitations    H/O - no current problems-anxiety related   PONV (postoperative nausea and vomiting)    Postpartum care following vaginal delivery (2/13) 06/21/2016   PTSD (post-traumatic stress disorder)    Smoker    Spontaneous vaginal delivery 06/21/2016   svd x 3   Vaginal Pap smear, abnormal     Past Surgical History:  Procedure Laterality Date    LEEP  2005, 2013   x 2   NASAL SINUS SURGERY  1999   WISDOM TOOTH EXTRACTION      Family History  Problem Relation Age of Onset   Coronary artery disease Mother 29   Hypertension Mother    Diabetes Mother    Heart disease Mother    Kidney Stones Mother    Asthma Mother    Kidney disease Mother    Hyperthyroidism Mother    COPD Mother    Heart attack Mother    Dementia Mother    Hyperlipidemia Mother    Arthritis Mother    Fibromyalgia Mother    Obesity Mother    Other Father        Shot-violence   Alcohol abuse Father    Hypertension Brother    Kidney Stones Brother    Hyperlipidemia Brother    COPD Brother    Heart attack Brother    Diabetes Brother    Drug abuse Brother    Hypertension Brother    High Cholesterol Brother    Obesity Brother    Obesity Brother    Obesity Brother    Hypertension Maternal Grandmother    Hyperlipidemia Maternal Grandmother    Cancer Maternal Grandmother        Skin   COPD Maternal Grandmother  Diabetes Maternal Grandmother    Cancer Maternal Grandfather        Bone   Cancer Paternal Grandmother    Stroke Paternal Grandmother    Aneurysm Paternal Grandfather    Hypertension Other    Diabetes Other    Asthma Son    Hypertension Maternal Aunt    COPD Maternal Aunt    Diabetes Maternal Aunt    Heart attack Maternal Aunt    Cancer Paternal Aunt        Breast   Asthma Son    Asthma Daughter     Social History   Tobacco Use   Smoking status: Every Day    Packs/day: 1.00    Years: 20.00    Total pack years: 20.00    Types: Cigarettes   Smokeless tobacco: Never  Vaping Use   Vaping Use: Never used  Substance Use Topics   Alcohol use: Not Currently   Drug use: No     Allergies  Allergen Reactions   Effexor [Venlafaxine]     Multiple somatic issues   Lexapro [Escitalopram Oxalate]     Mania    Paxil [Paroxetine Hcl]     Feels detached    Penicillins Hives    Has patient had a PCN reaction causing immediate  rash, facial/tongue/throat swelling, SOB or lightheadedness with hypotension: no Has patient had a PCN reaction causing severe rash involving mucus membranes or skin necrosis: yes Has patient had a PCN reaction that required hospitalization no Has patient had a PCN reaction occurring within the last 10 years: no If all of the above answers are "NO", then may proceed with Cephalosporin use.    Sulfa Antibiotics    Vicodin [Hydrocodone-Acetaminophen] Nausea And Vomiting    Projectile vomiting   Wellbutrin [Bupropion]     Became belligerant   Zoloft [Sertraline Hcl] Hives    rash   Differin [Adapalene] Rash   Doxycycline Rash   Lamictal [Lamotrigine] Rash   Latex Rash    Review of Systems NEGATIVE UNLESS OTHERWISE INDICATED IN HPI      Objective:     BP 102/68 (BP Location: Right Arm)   Pulse 94   Temp 98.7 F (37.1 C) (Temporal)   Ht 5' 6.93" (1.7 m)   Wt 165 lb 3.2 oz (74.9 kg)   LMP 11/03/2021   SpO2 98%   BMI 25.93 kg/m   Wt Readings from Last 3 Encounters:  11/15/21 165 lb 3.2 oz (74.9 kg)  11/15/20 156 lb 8 oz (71 kg)  10/07/20 163 lb 12.8 oz (74.3 kg)    BP Readings from Last 3 Encounters:  11/15/21 102/68  02/10/21 108/62  11/15/20 101/69     Physical Exam Vitals and nursing note reviewed.  Constitutional:      Appearance: Normal appearance. She is normal weight. She is not toxic-appearing.  HENT:     Head: Normocephalic and atraumatic.     Right Ear: Tympanic membrane, ear canal and external ear normal.     Left Ear: Tympanic membrane, ear canal and external ear normal.     Nose: Nose normal.     Mouth/Throat:     Mouth: Mucous membranes are moist.  Eyes:     Extraocular Movements: Extraocular movements intact.     Conjunctiva/sclera: Conjunctivae normal.     Pupils: Pupils are equal, round, and reactive to light.  Neck:     Thyroid: Thyromegaly present. No thyroid mass or thyroid tenderness.  Cardiovascular:     Rate  and Rhythm: Normal rate  and regular rhythm.     Pulses: Normal pulses.     Heart sounds: Normal heart sounds.  Pulmonary:     Effort: Pulmonary effort is normal.     Breath sounds: Normal breath sounds.  Abdominal:     General: Abdomen is flat. Bowel sounds are normal.     Palpations: Abdomen is soft.  Musculoskeletal:        General: Normal range of motion.     Cervical back: Normal range of motion and neck supple.  Lymphadenopathy:     Cervical: No cervical adenopathy.  Skin:    General: Skin is warm and dry.  Neurological:     General: No focal deficit present.     Mental Status: She is alert and oriented to person, place, and time.  Psychiatric:        Mood and Affect: Mood normal.        Behavior: Behavior normal.        Thought Content: Thought content normal.        Judgment: Judgment normal.        Assessment & Plan:   Problem List Items Addressed This Visit       Endocrine   Thyromegaly   Relevant Orders   Thyroid Panel With TSH   Thyroid peroxidase antibody   US THYROID     Other   Fibromyalgia   Relevant Medications   predniSONE (DELTASONE) 5 MG tablet   methocarbamol (ROBAXIN) 500 MG tablet   Other Visit Diagnoses     Encounter for annual physical exam    -  Primary   Relevant Orders   CBC with Differential/Platelet (Completed)   Comprehensive metabolic panel (Completed)   Lipid panel (Completed)   Hemoglobin A1c (Completed)   Vitamin D (25 hydroxy) (Completed)   POCT Urinalysis Dipstick (Automated) (Completed)   Elevated low density lipoprotein (LDL) cholesterol level       Relevant Orders   Lipid panel (Completed)   Vitamin D deficiency       Relevant Orders   Vitamin D (25 hydroxy) (Completed)   Episode of dizziness       Relevant Medications   blood glucose meter kit and supplies KIT   Other Relevant Orders   CBC with Differential/Platelet (Completed)   Comprehensive metabolic panel (Completed)   Hemoglobin A1c (Completed)   POCT Urinalysis Dipstick  (Automated) (Completed)        Meds ordered this encounter  Medications   predniSONE (DELTASONE) 5 MG tablet    Sig: Take 1 tablet (5 mg total) by mouth 2 (two) times daily with a meal.    Dispense:  10 tablet    Refill:  0    Order Specific Question:   Supervising Provider    Answer:   Marin Olp [[id number]]   methocarbamol (ROBAXIN) 500 MG tablet    Sig: Take 1 tablet (500 mg total) by mouth every 6 (six) hours as needed for muscle spasms.    Dispense:  30 tablet    Refill:  0    Order Specific Question:   Supervising Provider    Answer:   Yong Channel, STEPHEN O [[id number]]   blood glucose meter kit and supplies KIT    Sig: Dispense based on patient and insurance preference. Use up to four times daily as needed to test glucose.    Dispense:  1 each    Refill:  0    Order Specific Question:   Supervising  Provider    Answer:   Marin Olp [phone]    Order Specific Question:   Number of strips    Answer:   35    Order Specific Question:   Number of lancets    Answer:   50   PLAN: -Age-appropriate screening and counseling performed today. Will check labs and call with results. Preventive measures discussed and printed in AVS for patient.  -GYN for female care -Thyroid US / labs because of goiter - looks like 2019 she was supposed to have [place] but this was not done -Glucometer as requested by patient -Acute flare of fibromyalgia pain - trial prednisone low dose otherwise she has angry side effect; stop flexeril and start back on robaxin as this was helpful in the past per patient -Stay active, move daily, low-inflammatory foods  Patient Counseling: [x]  Nutrition: Stressed importance of moderation in sodium/caffeine intake, saturated fat and cholesterol, caloric balance, sufficient intake of fresh fruits, vegetables, and fiber.  [x]  Stressed the importance of regular exercise.   [x]  Substance Abuse: Discussed cessation/primary prevention of tobacco, alcohol, or other drug use;  driving or other dangerous activities under the influence; availability of treatment for abuse.   [x]  Injury prevention: Discussed safety belts, safety helmets, smoke detector, smoking near bedding or upholstery.   [x]  Sexuality: Discussed sexually transmitted diseases, partner selection, use of condoms, avoidance of unintended pregnancy  and contraceptive alternatives.   [x]  Dental health: Discussed importance of regular tooth brushing, flossing, and dental visits.  [x]  Health maintenance and immunizations reviewed. Please refer to Health maintenance section.     Return in about 6 months (around 05/18/2022) for recheck .   Cyani Kallstrom M Beaux Wedemeyer, PA-C

## 2021-11-16 ENCOUNTER — Other Ambulatory Visit: Payer: Self-pay

## 2021-11-16 ENCOUNTER — Other Ambulatory Visit: Payer: Self-pay | Admitting: Physician Assistant

## 2021-11-16 DIAGNOSIS — B9689 Other specified bacterial agents as the cause of diseases classified elsewhere: Secondary | ICD-10-CM

## 2021-11-16 DIAGNOSIS — R42 Dizziness and giddiness: Secondary | ICD-10-CM

## 2021-11-16 MED ORDER — BUTALBITAL-APAP-CAFFEINE 50-325-40 MG PO TABS: 2.0000 | ORAL_TABLET | Freq: Two times a day (BID) | ORAL | 0 refills | Status: DC | PRN

## 2021-11-16 MED ORDER — BLOOD GLUCOSE MONITOR KIT: PACK | 0 refills | Status: AC

## 2021-11-16 MED ORDER — ONDANSETRON HCL 4 MG PO TABS: 4.0000 mg | ORAL_TABLET | Freq: Three times a day (TID) | ORAL | 0 refills | Status: AC | PRN

## 2021-11-16 NOTE — Telephone Encounter (Signed)
Please see pt request

## 2021-11-17 LAB — THYROID PANEL WITH TSH
Free Thyroxine Index: 3.2 (ref 1.4–3.8)
T3 Uptake: 32 % (ref 22–35)
T4, Total: 9.9 ug/dL (ref 5.1–11.9)
TSH: 1 mIU/L

## 2021-11-17 LAB — THYROID PEROXIDASE ANTIBODY: Thyroperoxidase Ab SerPl-aCnc: <1 IU/mL (ref <9)

## 2021-11-18 ENCOUNTER — Other Ambulatory Visit: Payer: Self-pay

## 2021-11-18 ENCOUNTER — Encounter: Payer: Self-pay | Admitting: Physician Assistant

## 2021-11-18 MED ORDER — VITAMIN D (ERGOCALCIFEROL) 1.25 MG (50000 UNIT) PO CAPS: 50000.0000 [IU] | ORAL_CAPSULE | ORAL | 0 refills | Status: DC

## 2021-11-22 ENCOUNTER — Ambulatory Visit
Admission: RE | Admit: 2021-11-22 | Discharge: 2021-11-22 | Disposition: A | Discharge status: Home / Self Care | Payer: 59 | Source: Ambulatory Visit | Attending: Physician Assistant | Admitting: Physician Assistant

## 2021-11-22 ENCOUNTER — Inpatient Hospital Stay: Admission: RE | Admit: 2021-11-22 | Payer: 59 | Source: Ambulatory Visit

## 2021-11-22 DIAGNOSIS — E010 Iodine-deficiency related diffuse (endemic) goiter: Secondary | ICD-10-CM

## 2022-01-30 ENCOUNTER — Encounter: Payer: Self-pay | Admitting: *Deleted

## 2022-03-23 ENCOUNTER — Encounter: Payer: Self-pay | Admitting: Physician Assistant

## 2022-04-20 ENCOUNTER — Encounter: Payer: Self-pay | Admitting: *Deleted

## 2022-04-25 ENCOUNTER — Other Ambulatory Visit: Payer: Self-pay

## 2022-05-18 ENCOUNTER — Ambulatory Visit: Payer: 59 | Admitting: Physician Assistant

## 2022-05-18 ENCOUNTER — Encounter: Payer: Self-pay | Admitting: Physician Assistant

## 2022-05-18 VITALS — BP 126/72 | HR 104 | Temp 98.2°F | Ht 66.93 in | Wt 177.2 lb

## 2022-05-18 DIAGNOSIS — F410 Panic disorder [episodic paroxysmal anxiety] without agoraphobia: Secondary | ICD-10-CM | POA: Diagnosis not present

## 2022-05-18 DIAGNOSIS — F4310 Post-traumatic stress disorder, unspecified: Secondary | ICD-10-CM | POA: Diagnosis not present

## 2022-05-18 DIAGNOSIS — M797 Fibromyalgia: Secondary | ICD-10-CM | POA: Diagnosis not present

## 2022-05-18 DIAGNOSIS — G4700 Insomnia, unspecified: Secondary | ICD-10-CM

## 2022-05-18 DIAGNOSIS — F411 Generalized anxiety disorder: Secondary | ICD-10-CM | POA: Diagnosis not present

## 2022-05-18 DIAGNOSIS — K219 Gastro-esophageal reflux disease without esophagitis: Secondary | ICD-10-CM | POA: Insufficient documentation

## 2022-05-18 MED ORDER — ALPRAZOLAM 0.25 MG PO TABS: ORAL_TABLET | ORAL | 0 refills | Status: DC

## 2022-05-18 MED ORDER — IBUPROFEN 800 MG PO TABS: 800.0000 mg | ORAL_TABLET | Freq: Three times a day (TID) | ORAL | 0 refills | Status: DC | PRN

## 2022-05-18 MED ORDER — PANTOPRAZOLE SODIUM 20 MG PO TBEC: 20.0000 mg | DELAYED_RELEASE_TABLET | Freq: Every day | ORAL | 2 refills | Status: DC

## 2022-05-18 MED ORDER — VITAMIN D (ERGOCALCIFEROL) 1.25 MG (50000 UNIT) PO CAPS: 50000.0000 [IU] | ORAL_CAPSULE | ORAL | 0 refills | Status: DC

## 2022-05-18 NOTE — Assessment & Plan Note (Signed)
Counseling weekly

## 2022-05-18 NOTE — Progress Notes (Signed)
Subjective:    Patient ID: Abigail White, female    DOB: 1981/03/03, 42 y.o.   MRN: [id number]  Chief Complaint  Patient presents with   Follow-up    Pt is in for 6 mon f/u; pt states she is feeling extremely tired; not resting well; feeling very overwhelmed and physically hurts all over memory not well. Very stressed out due to emotional stress with family and things happening as of lately.    HPI Patient is in today for 6 month follow-up.  Interim hx:  -COVID-19 infection last month - kids and herself. Recovered well.  -Started seeing counselor every week recently - parents are in and out of hospital; chronic stress; kids are doing well thankfully   Past Medical History:  Diagnosis Date   ADHD (attention deficit hyperactivity disorder)    Allergy    Anemia    Anxiety    H/O   Carpal tunnel syndrome    bilateral   Chest pain    H/O - no current problems - anxiety related   Chronic fatigue    Depression    Gestational   Dyspnea    GERD (gastroesophageal reflux disease)    History of chickenpox    HSV (herpes simplex virus) infection    Hx of pyelonephritis    Migraine    Ovarian cyst    Palpitations    H/O - no current problems-anxiety related   PONV (postoperative nausea and vomiting)    Postpartum care following vaginal delivery (2/13) 06/21/2016   PTSD (post-traumatic stress disorder)    Smoker    Spontaneous vaginal delivery 06/21/2016   svd x 3   Vaginal Pap smear, abnormal     Past Surgical History:  Procedure Laterality Date   LEEP  2005, 2013   x 2   NASAL SINUS SURGERY  1999   WISDOM TOOTH EXTRACTION      Family History  Problem Relation Age of Onset   Coronary artery disease Mother 79   Hypertension Mother    Diabetes Mother    Heart disease Mother    Kidney Stones Mother    Asthma Mother    Kidney disease Mother    Hyperthyroidism Mother    COPD Mother    Heart attack Mother    Dementia Mother    Hyperlipidemia Mother     Arthritis Mother    Fibromyalgia Mother    Obesity Mother    Other Father        Shot-violence   Alcohol abuse Father    Hypertension Brother    Kidney Stones Brother    Hyperlipidemia Brother    COPD Brother    Heart attack Brother    Diabetes Brother    Drug abuse Brother    Hypertension Brother    High Cholesterol Brother    Obesity Brother    Obesity Brother    Obesity Brother    Hypertension Maternal Grandmother    Hyperlipidemia Maternal Grandmother    Cancer Maternal Grandmother        Skin   COPD Maternal Grandmother    Diabetes Maternal Grandmother    Cancer Maternal Grandfather        Bone   Cancer Paternal Grandmother    Stroke Paternal Grandmother    Aneurysm Paternal Grandfather    Hypertension Other    Diabetes Other    Asthma Son    Hypertension Maternal Aunt    COPD Maternal Aunt    Diabetes Maternal Aunt  Heart attack Maternal Aunt    Cancer Paternal Aunt        Breast   Asthma Son    Asthma Daughter     Social History   Tobacco Use   Smoking status: Every Day    Packs/day: 1.00    Years: 20.00    Total pack years: 20.00    Types: Cigarettes   Smokeless tobacco: Never  Vaping Use   Vaping Use: Never used  Substance Use Topics   Alcohol use: Not Currently   Drug use: No     Allergies  Allergen Reactions   Effexor [Venlafaxine]     Multiple somatic issues   Lexapro [Escitalopram Oxalate]     Mania    Paxil [Paroxetine Hcl]     Feels detached    Penicillins Hives    Has patient had a PCN reaction causing immediate rash, facial/tongue/throat swelling, SOB or lightheadedness with hypotension: no Has patient had a PCN reaction causing severe rash involving mucus membranes or skin necrosis: yes Has patient had a PCN reaction that required hospitalization no Has patient had a PCN reaction occurring within the last 10 years: no If all of the above answers are "NO", then may proceed with Cephalosporin use.    Sulfa Antibiotics     Vicodin [Hydrocodone-Acetaminophen] Nausea And Vomiting    Projectile vomiting   Wellbutrin [Bupropion]     Became belligerant   Zoloft [Sertraline Hcl] Hives    rash   Differin [Adapalene] Rash   Doxycycline Rash   Lamictal [Lamotrigine] Rash   Latex Rash    Review of Systems NEGATIVE UNLESS OTHERWISE INDICATED IN HPI      Objective:     BP 126/72 (BP Location: Left Arm)   Pulse (!) 104   Temp 98.2 F (36.8 C) (Temporal)   Ht 5' 6.93" (1.7 m)   Wt 177 lb 3.2 oz (80.4 kg)   LMP 05/14/2022 (Exact Date)   SpO2 97%   BMI 27.81 kg/m   Wt Readings from Last 3 Encounters:  05/18/22 177 lb 3.2 oz (80.4 kg)  11/15/21 165 lb 3.2 oz (74.9 kg)  11/15/20 156 lb 8 oz (71 kg)    BP Readings from Last 3 Encounters:  05/18/22 126/72  11/15/21 102/68  02/10/21 108/62     Physical Exam Vitals and nursing note reviewed.  Constitutional:      Appearance: Normal appearance.  Cardiovascular:     Rate and Rhythm: Normal rate and regular rhythm.     Pulses: Normal pulses.     Heart sounds: Normal heart sounds. No murmur heard. Pulmonary:     Effort: Pulmonary effort is normal.     Breath sounds: Normal breath sounds.  Neurological:     General: No focal deficit present.     Mental Status: She is alert and oriented to person, place, and time.  Psychiatric:        Mood and Affect: Mood is anxious and depressed. Affect is tearful.        Assessment & Plan:  Fibromyalgia Assessment & Plan: Several allergies to medications in the past; will not try anything new at this time. Lifestyle changes encouraged. Needs to find ways to reduce chronic stress. Ibuprofen 800 mg only prn severe pain, possible SE discussed.    Generalized anxiety disorder Assessment & Plan: Stable on Xanax 0.25 mg. Refilled today. One prescription of #30 lasts a year. PDMP reviewed today, no red flags, filling appropriately.    Orders: -  ALPRAZolam; Take 1/2 to one tablet daily as needed for  anxiety/panic attacks.  Dispense: 30 tablet; Refill: 0  Panic attacks -     ALPRAZolam; Take 1/2 to one tablet daily as needed for anxiety/panic attacks.  Dispense: 30 tablet; Refill: 0  PTSD (post-traumatic stress disorder) Assessment & Plan: Counseling weekly   Orders: -     ALPRAZolam; Take 1/2 to one tablet daily as needed for anxiety/panic attacks.  Dispense: 30 tablet; Refill: 0  Insomnia, unspecified type -     ALPRAZolam; Take 1/2 to one tablet daily as needed for anxiety/panic attacks.  Dispense: 30 tablet; Refill: 0  Gastroesophageal reflux disease without esophagitis Assessment & Plan: Protonix 20 mg daily. Cautioned about chronic ibuprofen use.    Other orders -     Vitamin D (Ergocalciferol); Take 1 capsule (50,000 Units total) by mouth every 7 (seven) days.  Dispense: 12 capsule; Refill: 0 -     Ibuprofen; Take 1 tablet (800 mg total) by mouth every 8 (eight) hours as needed for cramping.  Dispense: 90 tablet; Refill: 0 -     Pantoprazole Sodium; Take 1 tablet (20 mg total) by mouth daily.  Dispense: 30 tablet; Refill: 2        Return in about 6 months (around 11/16/2022) for recheck .     Isidor Bromell M Alexee Delsanto, PA-C

## 2022-05-18 NOTE — Assessment & Plan Note (Addendum)
Several allergies to medications in the past; will not try anything new at this time. Lifestyle changes encouraged. Needs to find ways to reduce chronic stress. Ibuprofen 800 mg only prn severe pain, possible SE discussed.

## 2022-05-18 NOTE — Assessment & Plan Note (Signed)
Protonix 20 mg daily. Cautioned about chronic ibuprofen use.

## 2022-05-18 NOTE — Assessment & Plan Note (Signed)
Stable on Xanax 0.25 mg. Refilled today. One prescription of #30 lasts a year. PDMP reviewed today, no red flags, filling appropriately.

## 2022-06-14 ENCOUNTER — Other Ambulatory Visit: Payer: Self-pay | Admitting: Physician Assistant

## 2022-06-28 ENCOUNTER — Telehealth: Payer: Self-pay | Admitting: Physician Assistant

## 2022-06-28 NOTE — Telephone Encounter (Signed)
Please advise 

## 2022-06-28 NOTE — Telephone Encounter (Signed)
Patient called wanting to be seen by alyssa- patient was advised alyssa will not be in the office until next week. Patient refused to be seen sooner.

## 2022-06-29 ENCOUNTER — Encounter: Payer: Self-pay | Admitting: Family Medicine

## 2022-06-29 ENCOUNTER — Ambulatory Visit: Payer: 59 | Admitting: Family Medicine

## 2022-06-29 VITALS — BP 110/60 | HR 102 | Temp 98.6°F | Ht 66.93 in | Wt 179.1 lb

## 2022-06-29 DIAGNOSIS — F32A Depression, unspecified: Secondary | ICD-10-CM

## 2022-06-29 DIAGNOSIS — M797 Fibromyalgia: Secondary | ICD-10-CM

## 2022-06-29 DIAGNOSIS — F419 Anxiety disorder, unspecified: Secondary | ICD-10-CM | POA: Diagnosis not present

## 2022-06-29 DIAGNOSIS — K219 Gastro-esophageal reflux disease without esophagitis: Secondary | ICD-10-CM | POA: Diagnosis not present

## 2022-06-29 MED ORDER — CELECOXIB 200 MG PO CAPS: 200.0000 mg | ORAL_CAPSULE | Freq: Two times a day (BID) | ORAL | 1 refills | Status: DC

## 2022-06-29 MED ORDER — PANTOPRAZOLE SODIUM 40 MG PO TBEC: 40.0000 mg | DELAYED_RELEASE_TABLET | Freq: Two times a day (BID) | ORAL | 3 refills | Status: DC

## 2022-06-29 NOTE — Telephone Encounter (Signed)
I m so sorry, for anxiety- patient called stating she is unable to stop crying and was upset alyssa was not in the office.

## 2022-06-29 NOTE — Progress Notes (Signed)
Subjective:     Patient ID: STAYCE KOCA, female    DOB: [birth year], 42 y.o.   MRN: [id number]  Chief Complaint  Patient presents with   Depression   Panic Attack   Fatigue   Generalized Body Aches    Hurt from head to toe, been going on for a while, trying to get through it     HPI  Depression/fatigue, panic attack-just tired and hard to fight thru it.  Crying all the time.   In Aug 2020-bad place.  And out of work for 2.5 yrs.  Loves being a Marine scientist.  Has tried lots of meds and either "allergic" or SE.  Saw psychiatrist.  In therapy since Feb 2020.  Since Oct, weekly therapy. Mom on disability since 30's-manic depressive.  Pt not think bipolar. Gaining wt as going to food for comfort.    Feels like failing everyone. A lot of stressors.  Irritable all the time. Cymbalta didn't help either but a lot of other circumstances.  Made her cry all the time.  Fibromyalgia-pain all over-worse past 2-3 months.  Feels like had been in "car wreck".  Brain fog and hard to get words out at times.  Joints all tender esp first am.  Hard to turn neck long time.  Some swelling in knees and ankles.  Has seen rheum in past.   Pt wonders if seroneg RA.  Was rec PT, but cost and time.  Willing to go now. Meloxicam-severe nausea.  Hard to get comfortable at night.  Heartburn-Bad. 6m not helping. Trying not to take ibuprofen.   IBS flaring and cramping a lot.    Health Maintenance Due  Topic Date Due   PAP SMEAR-Modifier  05/12/2022    Past Medical History:  Diagnosis Date   ADHD (attention deficit hyperactivity disorder)    Allergy    Anemia    Anxiety    H/O   Carpal tunnel syndrome    bilateral   Chest pain    H/O - no current problems - anxiety related   Chronic fatigue    Depression    Gestational   Dyspnea    GERD (gastroesophageal reflux disease)    History of chickenpox    HSV (herpes simplex virus) infection    Hx of pyelonephritis    Migraine    Ovarian cyst    Palpitations     H/O - no current problems-anxiety related   PONV (postoperative nausea and vomiting)    Postpartum care following vaginal delivery (2/13) 06/21/2016   PTSD (post-traumatic stress disorder)    Smoker    Spontaneous vaginal delivery 06/21/2016   svd x 3   Vaginal Pap smear, abnormal     Past Surgical History:  Procedure Laterality Date   LEEP  2005, 2013   x 2   NASAL SINUS SURGERY  1999   WISDOM TOOTH EXTRACTION      Outpatient Medications Prior to Visit  Medication Sig Dispense Refill   Acetaminophen (TYLENOL PO) Take by mouth as needed.     albuterol (PROAIR HFA) 108 (90 Base) MCG/ACT inhaler Inhale 2 puffs into the lungs every 6 (six) hours as needed. 18 g 2   ALPRAZolam (XANAX) 0.25 MG tablet Take 1/2 to one tablet daily as needed for anxiety/panic attacks. 30 tablet 0   blood glucose meter kit and supplies KIT Dispense based on patient and insurance preference. Use up to four times daily as needed to test glucose. 1 each 0  butalbital-acetaminophen-caffeine (FIORICET) 50-325-40 MG tablet Take 2 tablets by mouth 2 (two) times daily as needed for headache. 14 tablet 0   fluticasone (FLOVENT HFA) 110 MCG/ACT inhaler TAKE 2 PUFFS BY MOUTH TWICE A DAY 12 g 2   ibuprofen (ADVIL) 800 MG tablet TAKE 1 TABLET (800 MG TOTAL) BY MOUTH EVERY 8 (EIGHT) HOURS AS NEEDED FOR CRAMPING. 90 tablet 0   methocarbamol (ROBAXIN) 500 MG tablet Take 1 tablet (500 mg total) by mouth every 6 (six) hours as needed for muscle spasms. 30 tablet 0   metroNIDAZOLE (METROGEL) 1 % gel Apply topically as needed.     ondansetron (ZOFRAN) 4 MG tablet Take 1 tablet (4 mg total) by mouth every 8 (eight) hours as needed for nausea or vomiting. 20 tablet 0   valACYclovir (VALTREX) 500 MG tablet Take 1 tablet (500 mg total) by mouth as needed. Take 1 tablet as needed for outbreak 30 tablet 2   Vitamin D, Ergocalciferol, (DRISDOL) 1.25 MG (50000 UNIT) CAPS capsule Take 1 capsule (50,000 Units total) by mouth every 7 (seven)  days. 12 capsule 0   pantoprazole (PROTONIX) 20 MG tablet Take 1 tablet (20 mg total) by mouth daily. 30 tablet 2   No facility-administered medications prior to visit.    Allergies  Allergen Reactions   Effexor [Venlafaxine]     Multiple somatic issues   Lexapro [Escitalopram Oxalate]     Mania    Paxil [Paroxetine Hcl]     Feels detached    Penicillins Hives    Has patient had a PCN reaction causing immediate rash, facial/tongue/throat swelling, SOB or lightheadedness with hypotension: no Has patient had a PCN reaction causing severe rash involving mucus membranes or skin necrosis: yes Has patient had a PCN reaction that required hospitalization no Has patient had a PCN reaction occurring within the last 10 years: no If all of the above answers are "NO", then may proceed with Cephalosporin use.    Sulfa Antibiotics    Vicodin [Hydrocodone-Acetaminophen] Nausea And Vomiting    Projectile vomiting   Wellbutrin [Bupropion]     Became belligerant   Zoloft [Sertraline Hcl] Hives    rash   Differin [Adapalene] Rash   Doxycycline Rash   Lamictal [Lamotrigine] Rash   Latex Rash   ROS neg/noncontributory except as noted HPI/below      Objective:     BP 110/60   Pulse (!) 102   Temp 98.6 F (37 C) (Temporal)   Ht 5' 6.93" (1.7 m)   Wt 179 lb 2 oz (81.3 kg)   LMP 06/04/2022 (Approximate)   SpO2 97%   BMI 28.11 kg/m  Wt Readings from Last 3 Encounters:  06/29/22 179 lb 2 oz (81.3 kg)  05/18/22 177 lb 3.2 oz (80.4 kg)  11/15/21 165 lb 3.2 oz (74.9 kg)    Physical Exam   Gen: WDWN  HEENT: NCAT, conjunctiva not injected, sclera nonicteric NECK:  supple, no thyromegaly, no nodes, no carotid bruits CARDIAC: RRR, S1S2+, no murmur. DP 2+B LUNGS: CTAB. No wheezes EXT:  no edema MSK: no gross abnormalities.  NEURO: A&O x3.  CN II-XII intact.  PSYCH: anxious, crying mood. Good eye contact     Assessment & Plan:   Problem List Items Addressed This Visit        Digestive   Gastroesophageal reflux disease without esophagitis   Relevant Medications   pantoprazole (PROTONIX) 40 MG tablet     Other   Anxiety and depression   Fibromyalgia -  Primary   Relevant Medications   celecoxib (CELEBREX) 200 MG capsule   Other Relevant Orders   Ambulatory referral to Physical Therapy  1.  GERD-chronic.  Not controlled at all.  Has been on multiple meds in the past.  Will increase Protonix to 40 mg twice daily.  She has an appointment next week with her PCP. 2.  Anxiety/depression-severe.  Cannot stop crying.  No suicidal ideation.  She is currently in counseling.  She has seen psychiatry in the past.  We discussed possibly doing medication like Vraylar, however patient declined as the thought of medications is making her more anxious.  She is requesting genetic testing to see if that would help guide medical therapy.  Specimen was collected today and will be sent.  She would rather follow-up with her PCP next week. 3.  Fibromyalgia-chronic.  Currently in a flare.  NSAIDs are contributing to GI upset.  Will trial Celebrex 200 mg twice daily.  Also, refer to integrative therapy.  Meds ordered this encounter  Medications   pantoprazole (PROTONIX) 40 MG tablet    Sig: Take 1 tablet (40 mg total) by mouth 2 (two) times daily before a meal.    Dispense:  60 tablet    Refill:  3   celecoxib (CELEBREX) 200 MG capsule    Sig: Take 1 capsule (200 mg total) by mouth 2 (two) times daily.    Dispense:  60 capsule    Refill:  1    Wellington Hampshire, MD

## 2022-06-29 NOTE — Telephone Encounter (Signed)
Patient scheduled for 9:30 on 06/29/22.

## 2022-06-29 NOTE — Patient Instructions (Signed)
Celebrex for pain up to twice daily.  Tylenol.  No ibuprofen  Increase the protonix to 20m twice daily  Consider Vraylar for moods.

## 2022-06-30 ENCOUNTER — Telehealth: Payer: Self-pay | Admitting: Physician Assistant

## 2022-06-30 NOTE — Telephone Encounter (Signed)
.  Type of form received: fmla  Additional comments:   Received by: fmla source  Form should be Faxed to:[phone]  Form should be mailed to:  na   Is patient requesting call for pickup: no   Form placed:   [facility] charge sheet.  Yes   Individual made aware of 3-5 business day turn around (Y/N)?  No

## 2022-07-05 ENCOUNTER — Ambulatory Visit: Payer: 59 | Admitting: Physician Assistant

## 2022-07-05 ENCOUNTER — Encounter: Payer: Self-pay | Admitting: Physician Assistant

## 2022-07-05 VITALS — BP 120/80 | HR 98 | Temp 98.2°F | Ht 66.93 in | Wt 181.0 lb

## 2022-07-05 DIAGNOSIS — F32A Depression, unspecified: Secondary | ICD-10-CM | POA: Diagnosis not present

## 2022-07-05 DIAGNOSIS — F410 Panic disorder [episodic paroxysmal anxiety] without agoraphobia: Secondary | ICD-10-CM | POA: Diagnosis not present

## 2022-07-05 DIAGNOSIS — M797 Fibromyalgia: Secondary | ICD-10-CM

## 2022-07-05 DIAGNOSIS — F419 Anxiety disorder, unspecified: Secondary | ICD-10-CM | POA: Diagnosis not present

## 2022-07-05 NOTE — Patient Instructions (Signed)
Reach out to your psychiatrist, let me know if no appointments available quickly.  FMLA for PT for fibromyalgia pain.  If you develop suicidal thoughts, please tell someone and immediately proceed to our local 24/7 crisis center, [facility] at the [facility].[address] [place], [place]) [phone].

## 2022-07-05 NOTE — Assessment & Plan Note (Signed)
[place] Visit from 06/29/2022 in [place]  PHQ-9 Total Score 24         06/29/2022    9:47 AM 08/28/2017   11:27 AM 11/03/2016   11:51 AM  GAD 7 : Generalized Anxiety Score  Nervous, Anxious, on Edge '3 3 2  '$ Control/stop worrying '3 3 2  '$ Worry too much - different things '3 3 2  '$ Trouble relaxing '3 3 2  '$ Restless '3 2 2  '$ Easily annoyed or irritable '3 2 2  '$ Afraid - awful might happen '3 2 2  '$ Total GAD 7 Score '21 18 14  '$ Anxiety Difficulty Extremely difficult Extremely difficult     Severe Very anxious about medication as she had several adverse SE's in past She will reach out to her prior psychiatrist [facility] and establish again GeneSite testing results pending at this time

## 2022-07-05 NOTE — Telephone Encounter (Signed)
Paperwork in provider box awaiting patient appt 07/05/22 to discuss per PCP

## 2022-07-05 NOTE — Assessment & Plan Note (Addendum)
Currently taking Celebrex 200 mg BID Currently started PT twice per week x 6 weeks with [facility] Follows with Dr. Estanislado Pandy Will complete FMLA paperwork for fibromyalgia PT

## 2022-07-05 NOTE — Progress Notes (Signed)
Subjective:    Patient ID: Abigail White, female    DOB: [birth year], 42 y.o.   MRN: [id number]  Chief Complaint  Patient presents with   Anxiety    Pt discuss anxiety and depression and FMLA and STD paperwork;     Anxiety     Patient is in today for follow-up.  Patient says she "thinks she has met her threshold." Feels like she is "hanging on by a thread." Denies SI or HI. Says it's just life stressors. Constantly pouring for her husband, kids, parents on hospice.  Reports continuous crying at work. Fatigue feels work. Unsure if COVID-19 contributed or not.  Feels like she's not enough, stuff is piling up, waiting for something to give and symptoms to just go away. A week behind in schoolwork so far. Needing naps during the day. When she's tired she cries. 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   Counseling once weekly.   Started Physical Therapy for fibromyalgia, twice per week x 6 weeks; [facility], referred by Dr. Estanislado Pandy  Past Medical History:  Diagnosis Date   ADHD (attention deficit hyperactivity disorder)    Allergy    Anemia    Anxiety    H/O   Carpal tunnel syndrome    bilateral   Chest pain    H/O - no current problems - anxiety related   Chronic fatigue    Depression    Gestational   Dyspnea    GERD (gastroesophageal reflux disease)    History of chickenpox    HSV (herpes simplex virus) infection    Hx of pyelonephritis    Migraine    Ovarian cyst    Palpitations    H/O - no current problems-anxiety related   PONV (postoperative nausea and vomiting)    Postpartum care following vaginal delivery (2/13) 06/21/2016   PTSD (post-traumatic stress disorder)    Smoker    Spontaneous vaginal delivery 06/21/2016   svd x 3   Vaginal Pap smear, abnormal     Past Surgical History:  Procedure Laterality Date   LEEP  2005, 2013   x 2   NASAL SINUS SURGERY   1999   WISDOM TOOTH EXTRACTION      Family History  Problem Relation Age of Onset   Coronary artery disease Mother 61   Hypertension Mother    Diabetes Mother    Heart disease Mother    Kidney Stones Mother    Asthma Mother    Kidney disease Mother    Hyperthyroidism Mother    COPD Mother    Heart attack Mother    Dementia Mother    Hyperlipidemia Mother    Arthritis Mother    Fibromyalgia Mother    Obesity Mother    Other Father        Shot-violence   Alcohol abuse Father    Hypertension Brother    Kidney Stones Brother    Hyperlipidemia Brother    COPD Brother    Heart attack Brother    Diabetes Brother    Drug abuse Brother    Hypertension Brother    High Cholesterol Brother    Obesity Brother    Obesity Brother    Obesity Brother    Hypertension Maternal Grandmother    Hyperlipidemia Maternal Grandmother    Cancer Maternal Grandmother        Skin   COPD Maternal Grandmother  Diabetes Maternal Grandmother    Cancer Maternal Grandfather        Bone   Cancer Paternal Grandmother    Stroke Paternal Grandmother    Aneurysm Paternal Grandfather    Hypertension Other    Diabetes Other    Asthma Son    Hypertension Maternal Aunt    COPD Maternal Aunt    Diabetes Maternal Aunt    Heart attack Maternal Aunt    Cancer Paternal Aunt        Breast   Asthma Son    Asthma Daughter     Social History   Tobacco Use   Smoking status: Every Day    Packs/day: 1.00    Years: 20.00    Total pack years: 20.00    Types: Cigarettes   Smokeless tobacco: Never  Vaping Use   Vaping Use: Never used  Substance Use Topics   Alcohol use: Not Currently   Drug use: No     Allergies  Allergen Reactions   Effexor [Venlafaxine]     Multiple somatic issues   Lexapro [Escitalopram Oxalate]     Mania    Paxil [Paroxetine Hcl]     Feels detached    Penicillins Hives    Has patient had a PCN reaction causing immediate rash, facial/tongue/throat swelling, SOB or  lightheadedness with hypotension: no Has patient had a PCN reaction causing severe rash involving mucus membranes or skin necrosis: yes Has patient had a PCN reaction that required hospitalization no Has patient had a PCN reaction occurring within the last 10 years: no If all of the above answers are "NO", then may proceed with Cephalosporin use.    Sulfa Antibiotics    Vicodin [Hydrocodone-Acetaminophen] Nausea And Vomiting    Projectile vomiting   Wellbutrin [Bupropion]     Became belligerant   Zoloft [Sertraline Hcl] Hives    rash   Differin [Adapalene] Rash   Doxycycline Rash   Lamictal [Lamotrigine] Rash   Latex Rash    Review of Systems NEGATIVE UNLESS OTHERWISE INDICATED IN HPI      Objective:     BP 120/80 (BP Location: Left Arm)   Pulse 98   Temp 98.2 F (36.8 C) (Temporal)   Ht 5' 6.93" (1.7 m)   Wt 181 lb (82.1 kg)   LMP 06/04/2022 (Approximate)   SpO2 98%   BMI 28.41 kg/m   Wt Readings from Last 3 Encounters:  07/05/22 181 lb (82.1 kg)  06/29/22 179 lb 2 oz (81.3 kg)  05/18/22 177 lb 3.2 oz (80.4 kg)    BP Readings from Last 3 Encounters:  07/05/22 120/80  06/29/22 110/60  05/18/22 126/72     Physical Exam Vitals and nursing note reviewed.  Constitutional:      Appearance: Normal appearance.  Cardiovascular:     Rate and Rhythm: Normal rate.  Pulmonary:     Effort: Pulmonary effort is normal.  Neurological:     Mental Status: She is alert.  Psychiatric:        Mood and Affect: Mood is anxious. Affect is tearful.        Behavior: Behavior normal.        Thought Content: Thought content normal. Thought content does not include suicidal ideation. Thought content does not include suicidal plan.        Assessment & Plan:  Fibromyalgia Assessment & Plan: Currently taking Celebrex 200 mg BID Currently started PT twice per week x 6 weeks with [facility] Follows with Dr.  Deveshwar Will complete FMLA paperwork for fibromyalgia PT     Anxiety and depression Assessment & Plan: [place] Office Visit from 06/29/2022 in [place]  PHQ-9 Total Score 24         06/29/2022    9:47 AM 08/28/2017   11:27 AM 11/03/2016   11:51 AM  GAD 7 : Generalized Anxiety Score  Nervous, Anxious, on Edge '3 3 2  '$ Control/stop worrying '3 3 2  '$ Worry too much - different things '3 3 2  '$ Trouble relaxing '3 3 2  '$ Restless '3 2 2  '$ Easily annoyed or irritable '3 2 2  '$ Afraid - awful might happen '3 2 2  '$ Total GAD 7 Score '21 18 14  '$ Anxiety Difficulty Extremely difficult Extremely difficult     Severe Very anxious about medication as she had several adverse SE's in past She will reach out to her prior psychiatrist [facility] and establish again GeneSite testing results pending at this time    Panic attacks  -Pt still has Xanax 0.25 mg for severe panic, which she rarely takes because again, nervous about possible side effects      Return in about 3 months (around 10/03/2022) for follow-up.  This note was prepared with assistance of Systems analyst. Occasional wrong-word or sound-a-like substitutions may have occurred due to the inherent limitations of voice recognition software.  Time Spent: 42 minutes of total time was spent on the date of the encounter performing the following actions: chart review prior to seeing the patient, obtaining history, performing a medically necessary exam, counseling on the treatment plan, placing orders, and documenting in our EHR.       Riot Waterworth M Analyse [place], PA-C

## 2022-07-07 DIAGNOSIS — Z0279 Encounter for issue of other medical certificate: Secondary | ICD-10-CM

## 2022-07-07 NOTE — Telephone Encounter (Signed)
Pt paperwork completed and faxed on 07/07/22. Originals and charge sheet taken to front office.

## 2022-07-10 ENCOUNTER — Telehealth: Payer: Self-pay | Admitting: Physician Assistant

## 2022-07-10 NOTE — Telephone Encounter (Signed)
Please see patients response and advise if anything else is needed

## 2022-07-10 NOTE — Telephone Encounter (Signed)
..  Type of form received: FMLA  Additional comments:   Received by: Adonis Brook  Form should be Faxed to: [phone]  Form should be mailed to:    Is patient requesting call for pickup:   Form placed:  In provider's box  Attach charge sheet. yes  Individual made aware of 3-5 business day turn around (Y/N)?  Yes

## 2022-07-11 ENCOUNTER — Ambulatory Visit: Payer: 59 | Admitting: Adult Health

## 2022-07-13 NOTE — Telephone Encounter (Signed)
Called pt and lvm in regards to specific questions in regards to new [employer] paperwork to be completed. PCP advised would need to speak with patient directly in regards to request. Advised in vm I would be out of office tomorrow but back in the office on Monday. If needing something immediate Ova Freshwater would be working with PCP and could ask for her specifically.

## 2022-07-13 NOTE — Telephone Encounter (Signed)
Patient returned call. Informed of message below and she verbalized understanding. Based on PCP wanting to speak with patient in regards to request, pt has been scheduled vv on 07/19/22 '@11'$ :45am. Please advise on further actions.

## 2022-07-17 NOTE — Telephone Encounter (Signed)
Patient returned phone call to advise she is starting the inpatient treatment center 07/20/22 for 25 visits Monday-Friday all day. She is also in physical therapy Tues and Thurs for 4 weeks but states they will probably extend this. Advised patient according to provider office note she was advised we couldn't complete FMLA for and extended amount of time but was advised we would complete paperwork for leave until 08/23/2022. At that time if needed she would need to speak to PCP. Pt verbalized understanding; Also advised pt would complete FMLA paperwork and fax to [employer] for her and leave a copy at front desk for her as well since unable to upload to her MyChart per her request.

## 2022-07-19 ENCOUNTER — Other Ambulatory Visit: Payer: Self-pay | Admitting: Physician Assistant

## 2022-07-19 ENCOUNTER — Telehealth: Payer: 59 | Admitting: Physician Assistant

## 2022-07-21 ENCOUNTER — Encounter: Payer: Self-pay | Admitting: Physician Assistant

## 2022-07-21 NOTE — Telephone Encounter (Signed)
Please see pt msg and advise 

## 2022-07-28 ENCOUNTER — Encounter: Payer: Self-pay | Admitting: Physician Assistant

## 2022-07-28 ENCOUNTER — Telehealth (INDEPENDENT_AMBULATORY_CARE_PROVIDER_SITE_OTHER): Payer: 59 | Admitting: Physician Assistant

## 2022-07-28 VITALS — Ht 66.0 in | Wt 181.0 lb

## 2022-07-28 DIAGNOSIS — E7212 Methylenetetrahydrofolate reductase deficiency: Secondary | ICD-10-CM

## 2022-07-28 DIAGNOSIS — Z1589 Genetic susceptibility to other disease: Secondary | ICD-10-CM | POA: Diagnosis not present

## 2022-07-28 MED ORDER — DEPLIN 7.5 7.5-90.314 MG PO CAPS: 1.0000 | ORAL_CAPSULE | Freq: Every day | ORAL | 11 refills | Status: DC

## 2022-07-28 NOTE — Progress Notes (Signed)
   Virtual Visit via Video Note  I connected with  Abigail White  on 07/28/22 at 10:00 AM EDT by a video enabled telemedicine application and verified that I am speaking with the correct person using two identifiers.  Location: Patient: home Provider: Therapist, music at [place] present: Patient and myself   I discussed the limitations of evaluation and management by telemedicine and the availability of in person appointments. The patient expressed understanding and agreed to proceed.   History of Present Illness:  42 yo female presents for virtual visit to discuss recent GeneSight results. Positive for MTHFR gene. Says 6-7 years ago saw a psych NP, thought she wasn't converting folic acid well. She was put on Deplin and it help with mood regulation. States she took it for a few years and didn't have any side effects. Later on, maybe 2020 or 2021,  tried some L-methylfolate from [place] and it didn't work the same as the [employer]. She would like to start on this again and see if it will help her overall general well-being.    Observations/Objective:   Gen: Awake, alert, no acute distress Resp: Breathing is even and non-labored Psych: calm/pleasant demeanor Neuro: Alert and Oriented x 3, + facial symmetry, speech is clear.   Assessment and Plan:  1. MTHFR gene mutation Positive per recent GeneSight testing results.  Discussed with patient there is not convincing evidence one way or another for treatments involving this gene mutation. However, she reports seeing improvement with prior Deplin prescription. This medication is not FDA regulated. Pt aware of possible risks vs benefits. Will start on Deplin 7.5 mg once daily. She will let me know how she is doing with this medication, recheck as needed.    Follow Up Instructions:    I discussed the assessment and treatment plan with the patient. The patient was provided an opportunity to ask questions and all were  answered. The patient agreed with the plan and demonstrated an understanding of the instructions.   The patient was advised to call back or seek an in-person evaluation if the symptoms worsen or if the condition fails to improve as anticipated.  Ekansh Sherk M Chelesa Weingartner, PA-C

## 2022-07-31 ENCOUNTER — Other Ambulatory Visit: Payer: Self-pay | Admitting: Physician Assistant

## 2022-07-31 MED ORDER — DEPLIN 7.5 7.5-90.314 MG PO CAPS: 1.0000 | ORAL_CAPSULE | Freq: Every day | ORAL | 11 refills | Status: AC

## 2022-08-22 ENCOUNTER — Encounter: Payer: Self-pay | Admitting: Physician Assistant

## 2022-08-22 ENCOUNTER — Telehealth (INDEPENDENT_AMBULATORY_CARE_PROVIDER_SITE_OTHER): Payer: 59 | Admitting: Physician Assistant

## 2022-08-22 VITALS — Ht 66.0 in | Wt 181.0 lb

## 2022-08-22 DIAGNOSIS — F411 Generalized anxiety disorder: Secondary | ICD-10-CM

## 2022-08-22 DIAGNOSIS — G4700 Insomnia, unspecified: Secondary | ICD-10-CM

## 2022-08-22 DIAGNOSIS — F4310 Post-traumatic stress disorder, unspecified: Secondary | ICD-10-CM

## 2022-08-22 DIAGNOSIS — F410 Panic disorder [episodic paroxysmal anxiety] without agoraphobia: Secondary | ICD-10-CM | POA: Diagnosis not present

## 2022-08-22 DIAGNOSIS — Z1589 Genetic susceptibility to other disease: Secondary | ICD-10-CM

## 2022-08-22 MED ORDER — ALPRAZOLAM 0.25 MG PO TABS: ORAL_TABLET | ORAL | 0 refills | Status: DC

## 2022-08-22 NOTE — Telephone Encounter (Signed)
Patient dropped off document FMLA, to be filled out by provider. Patient requested to send it via Fax within ASAP. Document is located in providers tray at front office.

## 2022-08-22 NOTE — Progress Notes (Signed)
   Virtual Visit via Video Note  I connected with  Abigail White  on 08/22/22 at  1:00 PM EDT by a video enabled telemedicine application and verified that I am speaking with the correct person using two identifiers.  Location: Patient: home Provider: Nature conservation officer at [employer] Persons present: Patient and myself   I discussed the limitations of evaluation and management by telemedicine and the availability of in person appointments. The patient expressed understanding and agreed to proceed.   History of Present Illness:  42 yo female presents for virtual visit to update leave of absence paperwork and check-in with me on her progress.   She has been going through [facility] outpatient program for mental health. States she has noticed increase in anxiety recently, but thinks this is secondary to her time with [facility] and working through past traumas.  Currently has upgraded to [facility] 'partial hospitalization program' on a week by week basis, then should switch back to IOP. Then patient will be going through PTSD counseling and treatment.   She is needing Xanax refilled; counselor there suggested she take it regularly right now while working through trauma issues.  She is also taking Deplin daily and not seeing any negative side effects or issues.    Observations/Objective:   Gen: Awake, alert, no acute distress Resp: Breathing is even and non-labored Psych: calm/pleasant demeanor, tearful at times  Neuro: Alert and Oriented x 3, + facial symmetry, speech is clear.   Assessment and Plan:  1. Generalized anxiety disorder 2. Panic attacks 3. PTSD (post-traumatic stress disorder) 4. Insomnia, unspecified type  Started at [facility] outpatient program on 07/20/22 Will continue in PHP at this time on a week to week basis, may be 1 week or 4 weeks. She will contact counselor today and let me know expectations for return to work. Unclear at this time when she will be  good and cleared to return, still has a lot of past trauma to work through.  PDMP reviewed today, no red flags, filling appropriately.  Refilled Xanax 0.25 mg to take 1/2 to 1 tab daily prn panic / anxiety. #30, no refills.   5. MTHFR gene mutation Positive per recent [employer] testing results.  Discussed with patient there is not convincing evidence one way or another for treatments involving this gene mutation. However, she reports seeing improvement with prior Deplin prescription. This medication is not FDA regulated. Pt aware of possible risks vs benefits.  --Doing well so far, Deplin 7.5 mg once daily, no side effects, will continue at this time.    Follow Up Instructions:    I discussed the assessment and treatment plan with the patient. The patient was provided an opportunity to ask questions and all were answered. The patient agreed with the plan and demonstrated an understanding of the instructions.   The patient was advised to call back or seek an in-person evaluation if the symptoms worsen or if the condition fails to improve as anticipated.  Myrtis Maille M Lesta Limbert, PA-C

## 2022-08-22 NOTE — Telephone Encounter (Signed)
Pt had VV with PCP today and already has paperwork in the office, placed additional paperwork requested by patient in provider box for review as well.

## 2022-08-23 NOTE — Telephone Encounter (Signed)
Please see patient message and advise.

## 2022-08-24 ENCOUNTER — Other Ambulatory Visit: Payer: Self-pay | Admitting: Physician Assistant

## 2022-08-24 NOTE — Telephone Encounter (Signed)
Faxed to [employer] and received confirmation fax was received

## 2022-08-28 ENCOUNTER — Telehealth: Payer: 59 | Admitting: Physician Assistant

## 2022-08-30 ENCOUNTER — Other Ambulatory Visit: Payer: Self-pay | Admitting: Family Medicine

## 2022-09-19 ENCOUNTER — Ambulatory Visit (INDEPENDENT_AMBULATORY_CARE_PROVIDER_SITE_OTHER): Payer: 59 | Admitting: Physician Assistant

## 2022-09-19 ENCOUNTER — Encounter: Payer: Self-pay | Admitting: Physician Assistant

## 2022-09-19 VITALS — BP 116/74 | HR 103 | Ht 66.0 in | Wt 181.6 lb

## 2022-09-19 DIAGNOSIS — F411 Generalized anxiety disorder: Secondary | ICD-10-CM | POA: Diagnosis not present

## 2022-09-19 DIAGNOSIS — M797 Fibromyalgia: Secondary | ICD-10-CM

## 2022-09-19 DIAGNOSIS — K580 Irritable bowel syndrome with diarrhea: Secondary | ICD-10-CM | POA: Diagnosis not present

## 2022-09-19 DIAGNOSIS — F4310 Post-traumatic stress disorder, unspecified: Secondary | ICD-10-CM | POA: Diagnosis not present

## 2022-09-19 MED ORDER — DICYCLOMINE HCL 10 MG PO CAPS
10.0000 mg | ORAL_CAPSULE | Freq: Three times a day (TID) | ORAL | 2 refills | Status: DC
Start: 2022-09-19 — End: 2024-02-06

## 2022-09-19 NOTE — Progress Notes (Signed)
Subjective:    Patient ID: Abigail White, female    DOB: [birth year], 42 y.o.   MRN: [id number]  Chief Complaint  Patient presents with   Medical Management of Chronic Issues    P tin the office for 4 wk f/u; pt has concerns with weight gain and wants to discuss; pt states pain has been rough and intolerable;     HPI Patient is in today for 4 week f/up.  She has not been back to work yet. Feels like she is seeing improvement.  Two weeks ago started with Duwayne Heck, her trauma therapist. Doing these appointments in person. Finished with [facility] on Friday 09/15/22. Really enjoyed this program.   Fibromyalgia pain does better with physical therapy.  08/31/22 she had a uterine ablation for heavy menstrual bleeding. Some pain still, but doing better.   She will be starting back to work 12/20/22.  Past Medical History:  Diagnosis Date   ADHD (attention deficit hyperactivity disorder)    Allergy    Anemia    Anxiety    H/O   Carpal tunnel syndrome    bilateral   Chest pain    H/O - no current problems - anxiety related   Chronic fatigue    Depression    Gestational   Dyspnea    GERD (gastroesophageal reflux disease)    History of chickenpox    HSV (herpes simplex virus) infection    Hx of pyelonephritis    Migraine    Ovarian cyst    Palpitations    H/O - no current problems-anxiety related   PONV (postoperative nausea and vomiting)    Postpartum care following vaginal delivery (2/13) 06/21/2016   PTSD (post-traumatic stress disorder)    Smoker    Spontaneous vaginal delivery 06/21/2016   svd x 3   Vaginal Pap smear, abnormal     Past Surgical History:  Procedure Laterality Date   LEEP  2005, 2013   x 2   NASAL SINUS SURGERY  1999   WISDOM TOOTH EXTRACTION      Family History  Problem Relation Age of Onset   Coronary artery disease Mother 53   Hypertension Mother    Diabetes Mother    Heart disease Mother    Kidney Stones Mother    Asthma Mother    Kidney  disease Mother    Hyperthyroidism Mother    COPD Mother    Heart attack Mother    Dementia Mother    Hyperlipidemia Mother    Arthritis Mother    Fibromyalgia Mother    Obesity Mother    Other Father        Shot-violence   Alcohol abuse Father    Hypertension Brother    Kidney Stones Brother    Hyperlipidemia Brother    COPD Brother    Heart attack Brother    Diabetes Brother    Drug abuse Brother    Hypertension Brother    High Cholesterol Brother    Obesity Brother    Obesity Brother    Obesity Brother    Hypertension Maternal Grandmother    Hyperlipidemia Maternal Grandmother    Cancer Maternal Grandmother        Skin   COPD Maternal Grandmother    Diabetes Maternal Grandmother    Cancer Maternal Grandfather        Bone   Cancer Paternal Grandmother    Stroke Paternal Grandmother    Aneurysm Paternal Grandfather    Hypertension Other  Diabetes Other    Asthma Son    Hypertension Maternal Aunt    COPD Maternal Aunt    Diabetes Maternal Aunt    Heart attack Maternal Aunt    Cancer Paternal Aunt        Breast   Asthma Son    Asthma Daughter     Social History   Tobacco Use   Smoking status: Every Day    Packs/day: 1.00    Years: 20.00    Additional pack years: 0.00    Total pack years: 20.00    Types: Cigarettes   Smokeless tobacco: Never  Vaping Use   Vaping Use: Never used  Substance Use Topics   Alcohol use: Not Currently   Drug use: No     Allergies  Allergen Reactions   Effexor [Venlafaxine]     Multiple somatic issues   Lexapro [Escitalopram Oxalate]     Mania    Paxil [Paroxetine Hcl]     Feels detached    Penicillins Hives    Has patient had a PCN reaction causing immediate rash, facial/tongue/throat swelling, SOB or lightheadedness with hypotension: no Has patient had a PCN reaction causing severe rash involving mucus membranes or skin necrosis: yes Has patient had a PCN reaction that required hospitalization no Has patient had  a PCN reaction occurring within the last 10 years: no If all of the above answers are "NO", then may proceed with Cephalosporin use.    Sulfa Antibiotics    Vicodin [Hydrocodone-Acetaminophen] Nausea And Vomiting    Projectile vomiting   Wellbutrin [Bupropion]     Became belligerant   Zoloft [Sertraline Hcl] Hives    rash   Differin [Adapalene] Rash   Doxycycline Rash   Lamictal [Lamotrigine] Rash   Latex Rash    Review of Systems NEGATIVE UNLESS OTHERWISE INDICATED IN HPI      Objective:     BP 116/74 (BP Location: Left Arm)   Pulse (!) 103   Ht 5\' 6"  (1.676 m)   Wt 181 lb 9.6 oz (82.4 kg)   LMP 08/21/2022 (Exact Date)   SpO2 98%   BMI 29.31 kg/m   Wt Readings from Last 3 Encounters:  09/19/22 181 lb 9.6 oz (82.4 kg)  08/22/22 181 lb (82.1 kg)  07/28/22 181 lb (82.1 kg)    BP Readings from Last 3 Encounters:  09/19/22 116/74  07/05/22 120/80  06/29/22 110/60     Physical Exam Vitals and nursing note reviewed.  Constitutional:      Appearance: Normal appearance.  Eyes:     Extraocular Movements: Extraocular movements intact.     Conjunctiva/sclera: Conjunctivae normal.     Pupils: Pupils are equal, round, and reactive to light.  Cardiovascular:     Rate and Rhythm: Tachycardia present.     Heart sounds: No murmur heard. Pulmonary:     Effort: Pulmonary effort is normal.     Breath sounds: Normal breath sounds.  Neurological:     General: No focal deficit present.     Mental Status: She is alert and oriented to person, place, and time.  Psychiatric:        Mood and Affect: Mood normal.        Behavior: Behavior normal.        Assessment & Plan:  Generalized anxiety disorder Assessment & Plan: Stable on Xanax 0.25 mg. One prescription of #30 lasts about 3-6 months. PDMP reviewed today, no red flags, filling appropriately.     PTSD (  post-traumatic stress disorder) Assessment & Plan: Completed [place] outpatient program Counseling weekly     Irritable bowel syndrome with diarrhea -     Dicyclomine HCl; Take 1 capsule (10 mg total) by mouth 4 (four) times daily -  before meals and at bedtime.  Dispense: 45 capsule; Refill: 2  Fibromyalgia Assessment & Plan: Waxes and wanes  Currently taking Celebrex 200 mg BID Currently doing  PT twice per week x 6 weeks with [facility] Follows with Dr. Adron Bene to see her doing much better!  Continue regular follow-up with specialists. Return to work date is 12/20/2022 according to Duwayne Heck (PTSD counselor) treatment plan.  No limitations with returning to work, may need time weekly for appointments depending on continuity of care, to be determined.      Return in about 3 months (around 12/20/2022) for recheck/follow-up.    Lashena Signer M Jaimie Pippins, PA-C

## 2022-09-20 ENCOUNTER — Encounter: Payer: Self-pay | Admitting: Physician Assistant

## 2022-09-21 NOTE — Telephone Encounter (Signed)
Called pt and advised recommendations. Pt verbalized understanding

## 2022-09-22 ENCOUNTER — Encounter: Payer: Self-pay | Admitting: Physician Assistant

## 2022-09-25 ENCOUNTER — Telehealth: Payer: Self-pay | Admitting: Physician Assistant

## 2022-09-25 DIAGNOSIS — Z0279 Encounter for issue of other medical certificate: Secondary | ICD-10-CM

## 2022-09-25 NOTE — Telephone Encounter (Signed)
Received faxed document from [employer]  , to be filled out by provider. Patient requested to send it via Fax [phone] within 2 DAYS . Document is located in providers tray at front office.Please advise .

## 2022-09-25 NOTE — Assessment & Plan Note (Addendum)
Completed [facility] outpatient program Counseling weekly

## 2022-09-25 NOTE — Patient Instructions (Signed)
Glad to see her doing much better!  Continue regular follow-up with specialists. Return to work date is 12/20/2022 according to Duwayne Heck (PTSD counselor) treatment plan.  No limitations with returning to work, may need time weekly for appointments depending on continuity of care, to be determined.

## 2022-09-25 NOTE — Telephone Encounter (Signed)
Please see pt msg and advise 

## 2022-09-25 NOTE — Assessment & Plan Note (Signed)
Stable on Xanax 0.25 mg. One prescription of #30 lasts about 3-6 months. PDMP reviewed today, no red flags, filling appropriately.

## 2022-09-25 NOTE — Assessment & Plan Note (Signed)
Waxes and wanes  Currently taking Celebrex 200 mg BID Currently doing  PT twice per week x 6 weeks with [facility] Follows with Dr. Corliss Skains

## 2022-09-27 NOTE — Telephone Encounter (Signed)
Completed paperwork and sent in requested documentation via fax 09/27/22

## 2022-10-05 ENCOUNTER — Ambulatory Visit (HOSPITAL_COMMUNITY)
Admission: EM | Admit: 2022-10-05 | Discharge: 2022-10-05 | Disposition: A | Discharge status: Home / Self Care | Payer: 59 | Attending: Internal Medicine | Admitting: Internal Medicine

## 2022-10-05 ENCOUNTER — Other Ambulatory Visit: Payer: Self-pay

## 2022-10-05 ENCOUNTER — Encounter (HOSPITAL_COMMUNITY): Payer: Self-pay | Admitting: Emergency Medicine

## 2022-10-05 DIAGNOSIS — R110 Nausea: Secondary | ICD-10-CM | POA: Diagnosis present

## 2022-10-05 DIAGNOSIS — N3001 Acute cystitis with hematuria: Secondary | ICD-10-CM | POA: Insufficient documentation

## 2022-10-05 DIAGNOSIS — Z3202 Encounter for pregnancy test, result negative: Secondary | ICD-10-CM | POA: Diagnosis present

## 2022-10-05 DIAGNOSIS — R1030 Lower abdominal pain, unspecified: Secondary | ICD-10-CM | POA: Diagnosis not present

## 2022-10-05 DIAGNOSIS — R102 Pelvic and perineal pain: Secondary | ICD-10-CM | POA: Insufficient documentation

## 2022-10-05 LAB — POCT URINALYSIS DIP (MANUAL ENTRY)
Bilirubin, UA: NEGATIVE
Glucose, UA: NEGATIVE mg/dL
Ketones, POC UA: NEGATIVE mg/dL
Nitrite, UA: NEGATIVE
Protein Ur, POC: NEGATIVE mg/dL
Spec Grav, UA: 1.02 (ref 1.010–1.025)
Urobilinogen, UA: 0.2 E.U./dL
pH, UA: 7 (ref 5.0–8.0)

## 2022-10-05 LAB — POCT URINE PREGNANCY: Preg Test, Ur: NEGATIVE

## 2022-10-05 MED ORDER — KETOROLAC TROMETHAMINE 30 MG/ML IJ SOLN
30.0000 mg | Freq: Once | INTRAMUSCULAR | Status: AC
  Administered 2022-10-05: 30 mg via INTRAMUSCULAR

## 2022-10-05 MED ORDER — CEPHALEXIN 500 MG PO CAPS: 500.0000 mg | ORAL_CAPSULE | Freq: Two times a day (BID) | ORAL | 0 refills | Status: AC

## 2022-10-05 MED ORDER — KETOROLAC TROMETHAMINE 30 MG/ML IJ SOLN
INTRAMUSCULAR | Status: AC
  Filled 2022-10-05: qty 1

## 2022-10-05 NOTE — ED Provider Notes (Signed)
[facility]    CSN: [id number] Arrival date & time: 10/05/22  4098      History   Chief Complaint Chief Complaint  Patient presents with   Abdominal Pain    Patient is c/o bilateral lower Abd pain radiating to her back with nausea.     HPI Abigail White is a 42 y.o. female.   Patient presents to urgent care for evaluation of suprapubic abdominal discomfort and left lower pelvic pain that started a few days ago.  She states over the last 24 to 48 hours, the pain has moved into the right lower quadrant/right pelvis as well.  When she is laying flat, the pain is a 4 or a 5 on a scale of 0-10.  When she is sitting upright and standing, the pain worsened significantly.  Pain is worsened by movement and is described as a squeezing pain.  Reports associated nausea and low back discomfort without vomiting or diarrhea.  She had a uterine ablation on August 31, 2022 and has her follow-up OB/GYN appointment tomorrow.  She has had "grainy" vaginal discharge without odor, itching, or rash since the ablation but states that the discharge has started to clear up and is almost gone.  Denies vaginal bleeding.  No dysuria, urinary frequency, urinary urgency, or urinary odor.  Reports some bladder pressure.  History of urinary tract infections in the past, however has not had a UTI in "years".  She has a history of fibromyalgia and states she has not been taking her Celebrex medication to help with fibromyalgia due to stomach upset over the last few days.  She also has a history of irritable bowel syndrome and is currently taking Bentyl but states this is not helping very much.  She had azithromycin antibiotic to treat infection to tick bite wound approximately 2 to 3 weeks ago, otherwise no recent antibiotics.  Last normal bowel movement was this morning while at urgent care.  No blood or mucus in the stools.  She has been applying heat to the abdomen off-and-on and states this helped a little bit  with the pain.  She has also been taking Tylenol as needed without much relief of pain.   Abdominal Pain   Past Medical History:  Diagnosis Date   ADHD (attention deficit hyperactivity disorder)    Allergy    Anemia    Anxiety    H/O   Carpal tunnel syndrome    bilateral   Chest pain    H/O - no current problems - anxiety related   Chronic fatigue    Depression    Gestational   Dyspnea    GERD (gastroesophageal reflux disease)    History of chickenpox    HSV (herpes simplex virus) infection    Hx of pyelonephritis    Migraine    Ovarian cyst    Palpitations    H/O - no current problems-anxiety related   PONV (postoperative nausea and vomiting)    Postpartum care following vaginal delivery (2/13) 06/21/2016   PTSD (post-traumatic stress disorder)    Smoker    Spontaneous vaginal delivery 06/21/2016   svd x 3   Vaginal Pap smear, abnormal     Patient Active Problem List   Diagnosis Date Noted   Gastroesophageal reflux disease without esophagitis 05/18/2022   Generalized anxiety disorder 05/18/2022   PTSD (post-traumatic stress disorder) 05/18/2022   Fibromyalgia 11/15/2020   Chronic pain of multiple joints 11/15/2020   Thyromegaly 07/24/2017   Palpable lymph  node 07/24/2017   Visit for preventive health examination 08/07/2016   Migraine without status migrainosus, not intractable 07/30/2016   Attention deficit disorder (ADD) 07/26/2016   Postpartum care following vaginal delivery (2/13) 06/21/2016   Nicotine addiction 12/02/2012   Anxiety and depression     Past Surgical History:  Procedure Laterality Date   LEEP  2005, 2013   x 2   NASAL SINUS SURGERY  1999   WISDOM TOOTH EXTRACTION      OB History     Gravida  4   Para  3   Term  3   Preterm  0   AB  1   Living  3      SAB  0   IAB  1   Ectopic  0   Multiple      Live Births  3            Home Medications    Prior to Admission medications   Medication Sig Start Date End  Date Taking? Authorizing Provider  cephALEXin (KEFLEX) 500 MG capsule Take 1 capsule (500 mg total) by mouth 2 (two) times daily for 7 days. 10/05/22 10/12/22 Yes Carlisle Beers, FNP  Acetaminophen (TYLENOL PO) Take by mouth as needed.    [provider]  albuterol (PROAIR HFA) 108 (90 Base) MCG/ACT inhaler Inhale 2 puffs into the lungs every 6 (six) hours as needed. 01/22/20   Waldon Merl, PA-C  ALPRAZolam Prudy Feeler) 0.25 MG tablet Take 1/2 to one tablet daily as needed for anxiety/panic attacks. 08/22/22   Allwardt, Crist Infante, PA-C  blood glucose meter kit and supplies KIT Dispense based on patient and insurance preference. Use up to four times daily as needed to test glucose. 11/16/21   Allwardt, Crist Infante, PA-C  butalbital-acetaminophen-caffeine (FIORICET) 50-325-40 MG tablet Take 2 tablets by mouth 2 (two) times daily as needed for headache. 11/16/21   Allwardt, Crist Infante, PA-C  celecoxib (CELEBREX) 200 MG capsule TAKE 1 CAPSULE BY MOUTH TWICE A DAY 08/30/22   Allwardt, Alyssa M, PA-C  dicyclomine (BENTYL) 10 MG capsule Take 1 capsule (10 mg total) by mouth 4 (four) times daily -  before meals and at bedtime. 09/19/22   Allwardt, Crist Infante, PA-C  fluticasone (FLOVENT HFA) 110 MCG/ACT inhaler TAKE 2 PUFFS BY MOUTH TWICE A DAY 01/22/20   Waldon Merl, PA-C  ibuprofen (ADVIL) 800 MG tablet TAKE 1 TABLET (800 MG TOTAL) BY MOUTH EVERY 8 (EIGHT) HOURS AS NEEDED FOR CRAMPING. 06/14/22   Allwardt, Alyssa M, PA-C  L-Methylfolate-Algae (DEPLIN 7.5) 7.5-90.314 MG CAPS Take 1 capsule by mouth daily. Fax prescription to [facility]: [phone] 07/31/22   Allwardt, Crist Infante, PA-C  methocarbamol (ROBAXIN) 500 MG tablet Take 1 tablet (500 mg total) by mouth every 6 (six) hours as needed for muscle spasms. 11/15/21   Allwardt, Crist Infante, PA-C  metroNIDAZOLE (METROGEL) 1 % gel Apply topically as needed. 03/07/22   [provider]  ondansetron (ZOFRAN) 4 MG tablet Take 1 tablet (4 mg total)  by mouth every 8 (eight) hours as needed for nausea or vomiting. 11/16/21   Allwardt, Crist Infante, PA-C  pantoprazole (PROTONIX) 40 MG tablet Take 1 tablet (40 mg total) by mouth 2 (two) times daily before a meal. 06/29/22   Jeani Sow, MD  valACYclovir (VALTREX) 500 MG tablet Take 1 tablet (500 mg total) by mouth as needed. Take 1 tablet as needed for outbreak 11/15/21   Allwardt, Crist Infante, PA-C  Vitamin D, Ergocalciferol, (DRISDOL) 1.25 MG (50000 UNIT) CAPS capsule TAKE 1 CAPSULE (50,000 UNITS TOTAL) BY MOUTH EVERY 7 (SEVEN) DAYS 08/24/22   Allwardt, Crist Infante, PA-C    Family History Family History  Problem Relation Age of Onset   Coronary artery disease Mother 46   Hypertension Mother    Diabetes Mother    Heart disease Mother    Kidney Stones Mother    Asthma Mother    Kidney disease Mother    Hyperthyroidism Mother    COPD Mother    Heart attack Mother    Dementia Mother    Hyperlipidemia Mother    Arthritis Mother    Fibromyalgia Mother    Obesity Mother    Other Father        Shot-violence   Alcohol abuse Father    Hypertension Brother    Kidney Stones Brother    Hyperlipidemia Brother    COPD Brother    Heart attack Brother    Diabetes Brother    Drug abuse Brother    Hypertension Brother    High Cholesterol Brother    Obesity Brother    Obesity Brother    Obesity Brother    Hypertension Maternal Grandmother    Hyperlipidemia Maternal Grandmother    Cancer Maternal Grandmother        Skin   COPD Maternal Grandmother    Diabetes Maternal Grandmother    Cancer Maternal Grandfather        Bone   Cancer Paternal Grandmother    Stroke Paternal Grandmother    Aneurysm Paternal Grandfather    Hypertension Other    Diabetes Other    Asthma Son    Hypertension Maternal Aunt    COPD Maternal Aunt    Diabetes Maternal Aunt    Heart attack Maternal Aunt    Cancer Paternal Aunt        Breast   Asthma Son    Asthma Daughter     Social History Social History    Tobacco Use   Smoking status: Every Day    Packs/day: 1.00    Years: 20.00    Additional pack years: 0.00    Total pack years: 20.00    Types: Cigarettes   Smokeless tobacco: Never  Vaping Use   Vaping Use: Never used  Substance Use Topics   Alcohol use: Not Currently   Drug use: No     Allergies   Effexor [venlafaxine], Lexapro [escitalopram oxalate], Paxil [paroxetine hcl], Penicillins, Sulfa antibiotics, Vicodin [hydrocodone-acetaminophen], Wellbutrin [bupropion], Zoloft [sertraline hcl], Differin [adapalene], Doxycycline, Lamictal [lamotrigine], and Latex   Review of Systems Review of Systems  Gastrointestinal:  Positive for abdominal pain.  Per HPI   Physical Exam Triage Vital Signs ED Triage Vitals [10/05/22 0957]  Enc Vitals Group     BP 102/69     Pulse Rate 94     Resp 18     Temp 98.6 F (37 C)     Temp Source Oral     SpO2 98 %     Weight 182 lb 15.7 oz (83 kg)     Height 5\' 6"  (1.676 m)     Head Circumference      Peak Flow      Pain Score 7     Pain Loc      Pain Edu?      Excl. in GC?    No data found.  Updated Vital Signs BP 102/69 (BP Location: Right Arm)  Pulse 94   Temp 98.6 F (37 C) (Oral)   Resp 18   Ht 5\' 6"  (1.676 m)   Wt 182 lb 15.7 oz (83 kg)   LMP 08/21/2022 (Exact Date)   SpO2 98%   BMI 29.53 kg/m   Visual Acuity Right Eye Distance:   Left Eye Distance:   Bilateral Distance:    Right Eye Near:   Left Eye Near:    Bilateral Near:     Physical Exam Vitals and nursing note reviewed.  Constitutional:      Appearance: She is not ill-appearing or toxic-appearing.  HENT:     Head: Normocephalic and atraumatic.     Right Ear: Hearing and external ear normal.     Left Ear: Hearing and external ear normal.     Nose: Nose normal.     Mouth/Throat:     Lips: Pink.  Eyes:     General: Lids are normal. Vision grossly intact. Gaze aligned appropriately.     Extraocular Movements: Extraocular movements intact.      Conjunctiva/sclera: Conjunctivae normal.  Pulmonary:     Effort: Pulmonary effort is normal.  Abdominal:     General: Abdomen is flat. Bowel sounds are normal. There is no distension.     Palpations: Abdomen is soft. There is no mass.     Tenderness: There is abdominal tenderness in the right lower quadrant, suprapubic area and left lower quadrant. There is no right CVA tenderness, left CVA tenderness, guarding or rebound. Negative signs include McBurney's sign.  Musculoskeletal:     Cervical back: Neck supple.  Skin:    General: Skin is warm and dry.     Capillary Refill: Capillary refill takes less than 2 seconds.     Findings: No rash.  Neurological:     General: No focal deficit present.     Mental Status: She is alert and oriented to person, place, and time. Mental status is at baseline.     Cranial Nerves: No dysarthria or facial asymmetry.  Psychiatric:        Mood and Affect: Mood normal.        Speech: Speech normal.        Behavior: Behavior normal.        Thought Content: Thought content normal.        Judgment: Judgment normal.      UC Treatments / Results  Labs (all labs ordered are listed, but only abnormal results are displayed) Labs Reviewed  POCT URINALYSIS DIP (MANUAL ENTRY) - Abnormal; Notable for the following components:      Result Value   Clarity, UA cloudy (*)    Blood, UA small (*)    Leukocytes, UA Small (1+) (*)    All other components within normal limits  POCT URINE PREGNANCY - Normal  URINE CULTURE    EKG   Radiology No results found.  Procedures Procedures (including critical care time)  Medications Ordered in UC Medications  ketorolac (TORADOL) 30 MG/ML injection 30 mg (30 mg Intramuscular Given 10/05/22 1104)    Initial Impression / Assessment and Plan / UC Course  I have reviewed the triage vital signs and the nursing notes.  Pertinent labs & imaging results that were available during my care of the patient were reviewed by me  and considered in my medical decision making (see chart for details).   1.  Pelvic pain, negative urine pregnancy test, nausea Unclear etiology of patient's pelvic pain, however I suspect she may  be experiencing discomfort related to a urinary tract infection.  Urinalysis shows cloudy urine with small leukocytes and small blood.  Urine culture is pending.  Given recent uterine ablation and symptoms, I would like to go ahead and treat patient for acute cystitis with hematuria with Keflex antibiotic twice daily for the next 7 days.  She is allergic to penicillins (rash reaction), however has had Keflex in the past without rash or trouble.  Advised to push foods to stay well-hydrated. Abdominal exam is without peritoneal signs, therefore deferred imaging or referral to the emergency department.  Ketorolac 30 mg IM given in clinic for acute pain and inflammation.  Heat 20 minutes on 20 minutes off as needed at home for pain.  No NSAID for 12 to 24 hours due to ketorolac in clinic.  May use Tylenol/ibuprofen every 6 hours as needed for pain and inflammation at home.  Zofran 4 mg ODT every 8 hours as needed at home for nausea and vomiting.  Offered dose of Zofran in clinic, patient would rather have this from her home supply when she gets home.  She was able to schedule an appointment with Dr. Billy Coast (OB/GYN) for this afternoon for follow-up and further evaluation of pelvic pain.  Encouraged her to go to this appointment for further workup and evaluation.  Discussed physical exam and available lab work findings in clinic with patient.  Counseled patient regarding appropriate use of medications and potential side effects for all medications recommended or prescribed today. Discussed red flag signs and symptoms of worsening condition,when to call the PCP office, return to urgent care, and when to seek higher level of care in the emergency department. Patient verbalizes understanding and agreement with plan. All  questions answered. Patient discharged in stable condition.    Final Clinical Impressions(s) / UC Diagnoses   Final diagnoses:  Pelvic pain  Urine pregnancy test negative  Nausea     Discharge Instructions      Your urine shows signs of urinary tract infection.  Given your recent uterine ablation and significant pelvic pain, nausea, and low back discomfort, I would like to go ahead and start treatment for urinary tract infection.  Take Keflex antibiotic twice daily for the next 7 days. Take this medicine with food to avoid stomach upset. I have sent your urine for culture and will call you if we need to change your antibiotic based on your urine culture.  Take Zofran 4 mg underneath the tongue every 8 hours as needed for nausea and vomiting.  Drink plenty of water to avoid dehydration.  I gave you a shot of ketorolac in the clinic today to help with your pelvic pain.  Do not take any NSAID containing medications for the next 12 to 24 hours.  You may restart taking ibuprofen tonight if you need to.  You may also take 1000 mg of Tylenol every 6 hours as needed for pain.  Continue applying heat to the abdomen 20 minutes on 20 minutes off as needed for pelvic pain and discomfort.  Vaginal swab is pending and will come back in the next 2 to 3 days.  Will call you if there are any abnormal results on your vaginal swab.  Follow-up with OB/GYN as scheduled tomorrow.        ED Prescriptions     Medication Sig Dispense Auth. Provider   cephALEXin (KEFLEX) 500 MG capsule Take 1 capsule (500 mg total) by mouth 2 (two) times daily for 7 days. 14 capsule  Carlisle Beers, FNP      PDMP not reviewed this encounter.   Carlisle Beers, [place] 10/05/22 1114

## 2022-10-05 NOTE — ED Triage Notes (Signed)
Patient is c/o bilateral lower Abd pain radiating to her back with nausea.

## 2022-10-05 NOTE — Discharge Instructions (Addendum)
Your urine shows signs of urinary tract infection.  Given your recent uterine ablation and significant pelvic pain, nausea, and low back discomfort, I would like to go ahead and start treatment for urinary tract infection.  Take Keflex antibiotic twice daily for the next 7 days. Take this medicine with food to avoid stomach upset. I have sent your urine for culture and will call you if we need to change your antibiotic based on your urine culture.  Take Zofran 4 mg underneath the tongue every 8 hours as needed for nausea and vomiting.  Drink plenty of water to avoid dehydration.  I gave you a shot of ketorolac in the clinic today to help with your pelvic pain.  Do not take any NSAID containing medications for the next 12 to 24 hours.  You may restart taking ibuprofen tonight if you need to.  You may also take 1000 mg of Tylenol every 6 hours as needed for pain.  Continue applying heat to the abdomen 20 minutes on 20 minutes off as needed for pelvic pain and discomfort.  Vaginal swab is pending and will come back in the next 2 to 3 days.  Will call you if there are any abnormal results on your vaginal swab.  Follow-up with OB/GYN as scheduled tomorrow.

## 2022-10-06 LAB — URINE CULTURE: Culture: <10000 — AB

## 2022-10-09 ENCOUNTER — Encounter: Payer: Self-pay | Admitting: Physician Assistant

## 2022-10-09 NOTE — Telephone Encounter (Signed)
Please advise 

## 2022-10-10 ENCOUNTER — Ambulatory Visit (INDEPENDENT_AMBULATORY_CARE_PROVIDER_SITE_OTHER)
Admission: RE | Admit: 2022-10-10 | Discharge: 2022-10-10 | Disposition: A | Discharge status: Home / Self Care | Payer: 59 | Source: Ambulatory Visit | Attending: Physician Assistant | Admitting: Physician Assistant

## 2022-10-10 ENCOUNTER — Other Ambulatory Visit: Payer: Self-pay | Admitting: Obstetrics and Gynecology

## 2022-10-10 ENCOUNTER — Other Ambulatory Visit (INDEPENDENT_AMBULATORY_CARE_PROVIDER_SITE_OTHER): Payer: 59

## 2022-10-10 ENCOUNTER — Ambulatory Visit: Payer: 59 | Admitting: Physician Assistant

## 2022-10-10 VITALS — BP 102/68 | HR 93 | Temp 98.2°F | Ht 66.0 in | Wt 181.2 lb

## 2022-10-10 DIAGNOSIS — R110 Nausea: Secondary | ICD-10-CM

## 2022-10-10 DIAGNOSIS — R1030 Lower abdominal pain, unspecified: Secondary | ICD-10-CM

## 2022-10-10 DIAGNOSIS — R109 Unspecified abdominal pain: Secondary | ICD-10-CM

## 2022-10-10 LAB — CBC WITH DIFFERENTIAL/PLATELET
Basophils Absolute: 0.1 10*3/uL (ref 0.0–0.1)
Basophils Relative: 1 % (ref 0.0–3.0)
Eosinophils Absolute: 0.1 10*3/uL (ref 0.0–0.7)
Eosinophils Relative: 0.9 % (ref 0.0–5.0)
HCT: 42.1 % (ref 36.0–46.0)
Hemoglobin: 14.2 g/dL (ref 12.0–15.0)
Lymphocytes Relative: 17.2 % (ref 12.0–46.0)
Lymphs Abs: 2.1 10*3/uL (ref 0.7–4.0)
MCHC: 33.7 g/dL (ref 30.0–36.0)
MCV: 92.7 fl (ref 78.0–100.0)
Monocytes Absolute: 0.7 10*3/uL (ref 0.1–1.0)
Monocytes Relative: 5.6 % (ref 3.0–12.0)
Neutro Abs: 9 10*3/uL — ABNORMAL HIGH (ref 1.4–7.7)
Neutrophils Relative %: 75.3 % (ref 43.0–77.0)
Platelets: 358 10*3/uL (ref 150.0–400.0)
RBC: 4.55 Mil/uL (ref 3.87–5.11)
RDW: 13.1 % (ref 11.5–15.5)
WBC: 12 10*3/uL — ABNORMAL HIGH (ref 4.0–10.5)

## 2022-10-10 LAB — COMPREHENSIVE METABOLIC PANEL
ALT: 12 U/L (ref 0–35)
AST: 15 U/L (ref 0–37)
Albumin: 4.1 g/dL (ref 3.5–5.2)
Alkaline Phosphatase: 61 U/L (ref 39–117)
BUN: 7 mg/dL (ref 6–23)
CO2: 25 mEq/L (ref 19–32)
Calcium: 8.6 mg/dL (ref 8.4–10.5)
Chloride: 105 mEq/L (ref 96–112)
Creatinine, Ser: 0.68 mg/dL (ref 0.40–1.20)
GFR: 108.02 mL/min (ref >60.00)
Glucose, Bld: 88 mg/dL (ref 70–99)
Potassium: 3.9 mEq/L (ref 3.5–5.1)
Sodium: 136 mEq/L (ref 135–145)
Total Bilirubin: 0.4 mg/dL (ref 0.2–1.2)
Total Protein: 7 g/dL (ref 6.0–8.3)

## 2022-10-10 LAB — LIPASE: Lipase: 5 U/L — ABNORMAL LOW (ref 11.0–59.0)

## 2022-10-10 NOTE — Telephone Encounter (Signed)
FYI for patient upcoming appt today

## 2022-10-10 NOTE — Progress Notes (Signed)
Subjective:    Patient ID: Abigail White, female    DOB: [birth year], 42 y.o.   MRN: [id number]  Chief Complaint  Patient presents with   Follow-up    Pt in the office for ED f/u; pt was able to have a few small bowel movements Sunday and also this morning. Still not feeling relief and bloated feeling of fullness and heaviness. Clear liquids only. CT scan scheduled for tomorrow    HPI Patient with hx of IBS is in today for abdominal pain x 1 week.  Went to urgent care on 10/05/22; then Dr. Billy Coast that afternoon (pelvic exam completed at that time).   Can only be comfortable on her left side.  Feels better with bowel movement, went some this morning, but says she doesn't really feel urge to poop. Painful to pass gas.   Feels better laying on left side. Doing a lot of belching and this helps also.   Feels like "I'm pregnant and there's a head laying on my perineum." Pressure from-within feeling.  Drinking water, but staying nauseated. Not vomiting.   She has been doing home treatments for constipation, but not having results.  No abdominal surgery history.   No fever or chills.  A few stools in the last few weeks random, tarry color, but nothing consistent.  Urgent CT scheduled for tomorrow afternoon.   Not taking any ibuprofen or Celebrex. Just using Tylenol Arthritis.    Current pain level 4/10; intermittently pain goes 10/10.   Hasn't seen a GI doctor since about age 72 - told at that time her symptoms were all anxiety.     Past Medical History:  Diagnosis Date   ADHD (attention deficit hyperactivity disorder)    Allergy    Anemia    Anxiety    H/O   Carpal tunnel syndrome    bilateral   Chest pain    H/O - no current problems - anxiety related   Chronic fatigue    Depression    Gestational   Dyspnea    GERD (gastroesophageal reflux disease)    History of chickenpox    HSV (herpes simplex virus) infection    Hx of pyelonephritis    Migraine     Ovarian cyst    Palpitations    H/O - no current problems-anxiety related   PONV (postoperative nausea and vomiting)    Postpartum care following vaginal delivery (2/13) 06/21/2016   PTSD (post-traumatic stress disorder)    Smoker    Spontaneous vaginal delivery 06/21/2016   svd x 3   Vaginal Pap smear, abnormal     Past Surgical History:  Procedure Laterality Date   LEEP  2005, 2013   x 2   NASAL SINUS SURGERY  1999   WISDOM TOOTH EXTRACTION      Family History  Problem Relation Age of Onset   Coronary artery disease Mother 43   Hypertension Mother    Diabetes Mother    Heart disease Mother    Kidney Stones Mother    Asthma Mother    Kidney disease Mother    Hyperthyroidism Mother    COPD Mother    Heart attack Mother    Dementia Mother    Hyperlipidemia Mother    Arthritis Mother    Fibromyalgia Mother    Obesity Mother    Other Father        Shot-violence   Alcohol abuse Father    Hypertension Brother    Kidney Stones Brother  Hyperlipidemia Brother    COPD Brother    Heart attack Brother    Diabetes Brother    Drug abuse Brother    Hypertension Brother    High Cholesterol Brother    Obesity Brother    Obesity Brother    Obesity Brother    Hypertension Maternal Grandmother    Hyperlipidemia Maternal Grandmother    Cancer Maternal Grandmother        Skin   COPD Maternal Grandmother    Diabetes Maternal Grandmother    Cancer Maternal Grandfather        Bone   Cancer Paternal Grandmother    Stroke Paternal Grandmother    Aneurysm Paternal Grandfather    Hypertension Other    Diabetes Other    Asthma Son    Hypertension Maternal Aunt    COPD Maternal Aunt    Diabetes Maternal Aunt    Heart attack Maternal Aunt    Cancer Paternal Aunt        Breast   Asthma Son    Asthma Daughter     Social History   Tobacco Use   Smoking status: Every Day    Packs/day: 1.00    Years: 20.00    Additional pack years: 0.00    Total pack years: 20.00     Types: Cigarettes   Smokeless tobacco: Never  Vaping Use   Vaping Use: Never used  Substance Use Topics   Alcohol use: Not Currently   Drug use: No     Allergies  Allergen Reactions   Effexor [Venlafaxine]     Multiple somatic issues   Lexapro [Escitalopram Oxalate]     Mania    Paxil [Paroxetine Hcl]     Feels detached    Penicillins Hives    Has patient had a PCN reaction causing immediate rash, facial/tongue/throat swelling, SOB or lightheadedness with hypotension: no Has patient had a PCN reaction causing severe rash involving mucus membranes or skin necrosis: yes Has patient had a PCN reaction that required hospitalization no Has patient had a PCN reaction occurring within the last 10 years: no If all of the above answers are "NO", then may proceed with Cephalosporin use.    Sulfa Antibiotics    Vicodin [Hydrocodone-Acetaminophen] Nausea And Vomiting    Projectile vomiting   Wellbutrin [Bupropion]     Became belligerant   Zoloft [Sertraline Hcl] Hives    rash   Differin [Adapalene] Rash   Doxycycline Rash   Lamictal [Lamotrigine] Rash   Latex Rash    Review of Systems NEGATIVE UNLESS OTHERWISE INDICATED IN HPI      Objective:     BP 102/68 (BP Location: Left Arm)   Pulse 93   Temp 98.2 F (36.8 C) (Temporal)   Ht 5\' 6"  (1.676 m)   Wt 181 lb 3.2 oz (82.2 kg)   LMP 08/21/2022 (Exact Date)   SpO2 100%   BMI 29.25 kg/m   Wt Readings from Last 3 Encounters:  10/10/22 181 lb 3.2 oz (82.2 kg)  10/05/22 182 lb 15.7 oz (83 kg)  09/19/22 181 lb 9.6 oz (82.4 kg)    BP Readings from Last 3 Encounters:  10/10/22 102/68  10/05/22 102/69  09/19/22 116/74     Physical Exam Vitals and nursing note reviewed.  Constitutional:      General: She is in acute distress (moaning, laying on her right side).     Appearance: Normal appearance. She is not ill-appearing.  Cardiovascular:     Rate  and Rhythm: Normal rate and regular rhythm.     Pulses: Normal  pulses.  Pulmonary:     Effort: Pulmonary effort is normal.     Breath sounds: Normal breath sounds.  Abdominal:     General: Bowel sounds are normal.     Palpations: Abdomen is soft. There is no mass.     Tenderness: There is abdominal tenderness (diffuse lower abdomen). There is no right CVA tenderness, left CVA tenderness, guarding or rebound.     Hernia: No hernia is present.  Neurological:     Mental Status: She is alert and oriented to person, place, and time.  Psychiatric:        Mood and Affect: Mood normal.        Assessment & Plan:  Lower abdominal pain -     DG Abd 2 Views; Future -     CBC with Differential/Platelet -     Comprehensive metabolic panel -     Lipase  Nausea -     DG Abd 2 Views; Future -     CBC with Differential/Platelet -     Comprehensive metabolic panel -     Lipase   I personally reviewed patient's urgent care note from last week and urine culture. Negative for UTI. No urinary symptoms. No peritoneal signs on exam today. GYN work-up with Dr. Billy Coast last week was also negative. I suspect GI etiology as source of her pain, most likely severe obstipation, as she has had recurrent issues with constipation in the past. However, plan for STAT labs today to help r/o any concerns for acute pathology.  XRAY abdomen will help today also. She is scheduled for CT abd / pelv (ordered by Dr. Billy Coast) tomorrow. She will continue to stay hydrated, small frequent meals. Red flag symptoms discussed with her again - she knows to go to ED tonight if necessary.    Return if symptoms worsen or fail to improve.    Constantin Hillery M Sol Englert, PA-C

## 2022-10-11 ENCOUNTER — Ambulatory Visit
Admission: RE | Admit: 2022-10-11 | Discharge: 2022-10-11 | Disposition: A | Discharge status: Home / Self Care | Payer: 59 | Source: Ambulatory Visit | Attending: Obstetrics and Gynecology | Admitting: Obstetrics and Gynecology

## 2022-10-11 DIAGNOSIS — R109 Unspecified abdominal pain: Secondary | ICD-10-CM

## 2022-10-11 MED ORDER — IOPAMIDOL (ISOVUE-300) INJECTION 61%
100.0000 mL | Freq: Once | INTRAVENOUS | Status: AC | PRN
  Administered 2022-10-11: 100 mL via INTRAVENOUS

## 2022-10-13 ENCOUNTER — Telehealth: Payer: Self-pay | Admitting: Physician Assistant

## 2022-10-13 NOTE — Telephone Encounter (Signed)
Received faxed document from [employer] claim # [id number]   , to be filled out by provider. Patient requested to send it via Fax [phone] . Document is located in providers tray at front office.Please advise .

## 2022-10-13 NOTE — Telephone Encounter (Signed)
Please see pt msg and advise 

## 2022-10-16 ENCOUNTER — Other Ambulatory Visit: Payer: Self-pay | Admitting: Physician Assistant

## 2022-10-16 DIAGNOSIS — R194 Change in bowel habit: Secondary | ICD-10-CM

## 2022-10-16 DIAGNOSIS — R1030 Lower abdominal pain, unspecified: Secondary | ICD-10-CM

## 2022-10-16 DIAGNOSIS — R110 Nausea: Secondary | ICD-10-CM

## 2022-10-16 NOTE — Telephone Encounter (Signed)
Paperwork/forms completed and faxed back to [employer] as requested. Confirmation of fax printed and filed

## 2022-10-17 ENCOUNTER — Institutional Professional Consult (permissible substitution): Payer: 59 | Admitting: Plastic Surgery

## 2022-10-31 ENCOUNTER — Other Ambulatory Visit: Payer: Self-pay | Admitting: Physician Assistant

## 2022-10-31 ENCOUNTER — Other Ambulatory Visit: Payer: Self-pay | Admitting: Family Medicine

## 2022-11-03 ENCOUNTER — Encounter: Payer: Self-pay | Admitting: Gastroenterology

## 2022-11-04 ENCOUNTER — Institutional Professional Consult (permissible substitution): Payer: 59 | Admitting: Plastic Surgery

## 2022-11-16 ENCOUNTER — Ambulatory Visit (INDEPENDENT_AMBULATORY_CARE_PROVIDER_SITE_OTHER)
Admission: RE | Admit: 2022-11-16 | Discharge: 2022-11-16 | Disposition: A | Discharge status: Home / Self Care | Payer: 59 | Source: Ambulatory Visit | Attending: Physician Assistant | Admitting: Physician Assistant

## 2022-11-16 ENCOUNTER — Ambulatory Visit: Payer: 59 | Admitting: Physician Assistant

## 2022-11-16 VITALS — BP 108/74 | HR 82 | Temp 97.8°F | Ht 66.0 in | Wt 180.2 lb

## 2022-11-16 DIAGNOSIS — E559 Vitamin D deficiency, unspecified: Secondary | ICD-10-CM | POA: Diagnosis not present

## 2022-11-16 DIAGNOSIS — M545 Low back pain, unspecified: Secondary | ICD-10-CM | POA: Diagnosis not present

## 2022-11-16 DIAGNOSIS — M549 Dorsalgia, unspecified: Secondary | ICD-10-CM | POA: Diagnosis not present

## 2022-11-16 DIAGNOSIS — E782 Mixed hyperlipidemia: Secondary | ICD-10-CM

## 2022-11-16 DIAGNOSIS — M5412 Radiculopathy, cervical region: Secondary | ICD-10-CM | POA: Diagnosis not present

## 2022-11-16 DIAGNOSIS — E538 Deficiency of other specified B group vitamins: Secondary | ICD-10-CM | POA: Diagnosis not present

## 2022-11-16 LAB — LIPID PANEL
Cholesterol: 225 mg/dL — ABNORMAL HIGH (ref 0–200)
HDL: 40 mg/dL (ref >39.00)
LDL Cholesterol: 164 mg/dL — ABNORMAL HIGH (ref 0–99)
NonHDL: 184.67
Total CHOL/HDL Ratio: 6
Triglycerides: 105 mg/dL (ref 0.0–149.0)
VLDL: 21 mg/dL (ref 0.0–40.0)

## 2022-11-16 LAB — VITAMIN B12: Vitamin B-12: 199 pg/mL — ABNORMAL LOW (ref 211–911)

## 2022-11-16 LAB — VITAMIN D 25 HYDROXY (VIT D DEFICIENCY, FRACTURES): VITD: 25.35 ng/mL — ABNORMAL LOW (ref 30.00–100.00)

## 2022-11-16 MED ORDER — TRAMADOL HCL 50 MG PO TABS
50.0000 mg | ORAL_TABLET | Freq: Three times a day (TID) | ORAL | 0 refills | Status: AC | PRN
Start: 2022-11-16 — End: 2022-11-23

## 2022-11-16 MED ORDER — METHOCARBAMOL 500 MG PO TABS
500.0000 mg | ORAL_TABLET | Freq: Four times a day (QID) | ORAL | 0 refills | Status: DC | PRN
Start: 2022-11-16 — End: 2024-02-06

## 2022-11-16 NOTE — Progress Notes (Signed)
Subjective:    Patient ID: Abigail White, female    DOB: Aug 23, 1980, 42 y.o.   MRN: [id number]  Chief Complaint  Patient presents with   Medical Management of Chronic Issues    Pt in office for 6 mon f/u for recheck; pt states yesterday something popped in neck and not in a good way per pt with neck and shoulder. PT stated pt should mention an MRI to start and see what is going on, left hand and arm tingling and pt in pain when having to turn or move. Pt stating scheduled for Pap and mammogram 11/17/22.     HPI Patient is in today for neck pain.  She has [facility]. Usually goes 1-2 times per week. Works on hips, shoulders, neck the most.   Hurts to squeeze her left hand. Tingling / numbness pain going down left neck, shoulder, arm. No loss of grip strength.   Feels pain in her upper and lower back as well. No recent injuries. Would like XRAYs.  Also requesting lab rechecks.   Past Medical History:  Diagnosis Date   ADHD (attention deficit hyperactivity disorder)    Allergy    Anemia    Anxiety    H/O   Carpal tunnel syndrome    bilateral   Chest pain    H/O - no current problems - anxiety related   Chronic fatigue    Depression    Gestational   Dyspnea    GERD (gastroesophageal reflux disease)    History of chickenpox    HSV (herpes simplex virus) infection    Hx of pyelonephritis    Migraine    Ovarian cyst    Palpitations    H/O - no current problems-anxiety related   PONV (postoperative nausea and vomiting)    Postpartum care following vaginal delivery (2/13) 06/21/2016   PTSD (post-traumatic stress disorder)    Smoker    Spontaneous vaginal delivery 06/21/2016   svd x 3   Vaginal Pap smear, abnormal     Past Surgical History:  Procedure Laterality Date   LEEP  2005, 2013   x 2   NASAL SINUS SURGERY  1999   WISDOM TOOTH EXTRACTION      Family History  Problem Relation Age of Onset   Coronary artery disease  Mother 24   Hypertension Mother    Diabetes Mother    Heart disease Mother    Kidney Stones Mother    Asthma Mother    Kidney disease Mother    Hyperthyroidism Mother    COPD Mother    Heart attack Mother    Dementia Mother    Hyperlipidemia Mother    Arthritis Mother    Fibromyalgia Mother    Obesity Mother    Other Father        Shot-violence   Alcohol abuse Father    Hypertension Brother    Kidney Stones Brother    Hyperlipidemia Brother    COPD Brother    Heart attack Brother    Diabetes Brother    Drug abuse Brother    Hypertension Brother    High Cholesterol Brother    Obesity Brother    Obesity Brother    Obesity Brother    Hypertension Maternal Grandmother    Hyperlipidemia Maternal Grandmother    Cancer Maternal Grandmother        Skin   COPD Maternal Grandmother    Diabetes Maternal Grandmother  Cancer Maternal Grandfather        Bone   Cancer Paternal Grandmother    Stroke Paternal Grandmother    Aneurysm Paternal Grandfather    Hypertension Other    Diabetes Other    Asthma Son    Hypertension Maternal Aunt    COPD Maternal Aunt    Diabetes Maternal Aunt    Heart attack Maternal Aunt    Cancer Paternal Aunt        Breast   Asthma Son    Asthma Daughter     Social History   Tobacco Use   Smoking status: Every Day    Current packs/day: 1.00    Average packs/day: 1 pack/day for 20.0 years (20.0 ttl pk-yrs)    Types: Cigarettes   Smokeless tobacco: Never  Vaping Use   Vaping status: Never Used  Substance Use Topics   Alcohol use: Not Currently   Drug use: No     Allergies  Allergen Reactions   Effexor [Venlafaxine]     Multiple somatic issues   Lexapro [Escitalopram Oxalate]     Mania    Paxil [Paroxetine Hcl]     Feels detached    Penicillins Hives    Has patient had a PCN reaction causing immediate rash, facial/tongue/throat swelling, SOB or lightheadedness with hypotension: no Has patient had a PCN reaction causing severe  rash involving mucus membranes or skin necrosis: yes Has patient had a PCN reaction that required hospitalization no Has patient had a PCN reaction occurring within the last 10 years: no If all of the above answers are "NO", then may proceed with Cephalosporin use.    Sulfa Antibiotics    Vicodin [Hydrocodone-Acetaminophen] Nausea And Vomiting    Projectile vomiting   Wellbutrin [Bupropion]     Became belligerant   Zoloft [Sertraline Hcl] Hives    rash   Differin [Adapalene] Rash   Doxycycline Rash   Lamictal [Lamotrigine] Rash   Latex Rash    Review of Systems NEGATIVE UNLESS OTHERWISE INDICATED IN HPI      Objective:     BP 108/74 (BP Location: Left Arm)   Pulse 82   Temp 97.8 F (36.6 C) (Temporal)   Ht 5\' 6"  (1.676 m)   Wt 180 lb 3.2 oz (81.7 kg)   SpO2 99%   BMI 29.09 kg/m   Wt Readings from Last 3 Encounters:  11/16/22 180 lb 3.2 oz (81.7 kg)  10/10/22 181 lb 3.2 oz (82.2 kg)  10/05/22 182 lb 15.7 oz (83 kg)    BP Readings from Last 3 Encounters:  11/16/22 108/74  10/10/22 102/68  10/05/22 102/69     Physical Exam Vitals and nursing note reviewed.  Constitutional:      Appearance: Normal appearance.  Cardiovascular:     Rate and Rhythm: Normal rate and regular rhythm.  Pulmonary:     Effort: Pulmonary effort is normal.     Breath sounds: Normal breath sounds.  Musculoskeletal:        General: Tenderness present. No swelling or signs of injury.     Comments: Diffuse TTP along left side of neck and upper back, lower back; muscular spasms noted and trigger points.   Neurological:     Mental Status: She is alert.  Psychiatric:     Comments: Tearful at times         Assessment & Plan:  Left cervical radiculopathy -     DG Cervical Spine Complete; Future -     DG  Thoracic Spine 2 View; Future -     Methocarbamol; Take 1 tablet (500 mg total) by mouth every 6 (six) hours as needed for muscle spasms.  Dispense: 30 tablet; Refill: 0 -     traMADol  HCl; Take 1 tablet (50 mg total) by mouth every 8 (eight) hours as needed for up to 7 days.  Dispense: 21 tablet; Refill: 0  Upper back pain -     DG Cervical Spine Complete; Future -     DG Thoracic Spine 2 View; Future -     Methocarbamol; Take 1 tablet (500 mg total) by mouth every 6 (six) hours as needed for muscle spasms.  Dispense: 30 tablet; Refill: 0 -     traMADol HCl; Take 1 tablet (50 mg total) by mouth every 8 (eight) hours as needed for up to 7 days.  Dispense: 21 tablet; Refill: 0  Lumbar pain -     DG Lumbar Spine 2-3 Views; Future -     Methocarbamol; Take 1 tablet (500 mg total) by mouth every 6 (six) hours as needed for muscle spasms.  Dispense: 30 tablet; Refill: 0 -     traMADol HCl; Take 1 tablet (50 mg total) by mouth every 8 (eight) hours as needed for up to 7 days.  Dispense: 21 tablet; Refill: 0  Mixed hyperlipidemia Assessment & Plan: Lab Results  Component Value Date   CHOL 225 (H) 11/16/2022   HDL 40.00 11/16/2022   LDLCALC 164 (H) 11/16/2022   TRIG 105.0 11/16/2022   CHOLHDL 6 11/16/2022    The 10-year ASCVD risk score (Arnett DK, et al., 2019) is: 4%   Values used to calculate the score:     Age: 58 years     Sex: Female     Is Non-Hispanic African American: No     Diabetic: No     Tobacco smoker: Yes     Systolic Blood Pressure: 108 mmHg     Is BP treated: No     HDL Cholesterol: 40 mg/dL     Total Cholesterol: 225 mg/dL  Diet/ lifestyle advised  Orders: -     Lipid panel  Vitamin D deficiency -     VITAMIN D 25 Hydroxy (Vit-D Deficiency, Fractures)  B12 deficiency -     Vitamin B12    PDMP reviewed today, no red flags -sent Rx for Tramadol to take as directed, do not drive with this medication, pt agreeable with plan. Pending XRAYs, may need to f/up with ortho or sports medicine for additional eval. PT has not been helping as much as we'd hoped.     Return in about 3 months (around 02/16/2023) for recheck/follow-up.  This note was  prepared with assistance of Conservation officer, historic buildings. Occasional wrong-word or sound-a-like substitutions may have occurred due to the inherent limitations of voice recognition software.  Time Spent: 31 minutes of total time was spent on the date of the encounter performing the following actions: chart review prior to seeing the patient, obtaining history, performing a medically necessary exam, counseling on the treatment plan, placing orders, and documenting in our EHR.       Deziyah Arvin M Imogine Carvell, PA-C

## 2022-11-17 LAB — HM MAMMOGRAPHY

## 2022-11-17 NOTE — Assessment & Plan Note (Signed)
Lab Results  Component Value Date   CHOL 225 (H) 11/16/2022   HDL 40.00 11/16/2022   LDLCALC 164 (H) 11/16/2022   TRIG 105.0 11/16/2022   CHOLHDL 6 11/16/2022    The 10-year ASCVD risk score (Arnett DK, et al., 2019) is: 4%   Values used to calculate the score:     Age: 42 years     Sex: Female     Is Non-Hispanic African American: No     Diabetic: No     Tobacco smoker: Yes     Systolic Blood Pressure: 108 mmHg     Is BP treated: No     HDL Cholesterol: 40 mg/dL     Total Cholesterol: 225 mg/dL  Diet/ lifestyle advised

## 2022-11-20 ENCOUNTER — Encounter: Payer: Self-pay | Admitting: Physician Assistant

## 2022-11-22 NOTE — Telephone Encounter (Signed)
Patient states she was returning call from office. States that she would like to take B12 injections at home once a week for however many weeks needed before going to the oral supplement.

## 2022-11-24 ENCOUNTER — Encounter: Payer: Self-pay | Admitting: Physician Assistant

## 2022-11-24 MED ORDER — CYANOCOBALAMIN 1000 MCG/ML IJ SOLN: INTRAMUSCULAR | 6 refills | Status: DC

## 2022-11-24 MED ORDER — "BD LUER-LOK SYRINGE 25G X 1"" 3 ML MISC": 0 refills | Status: AC

## 2022-12-01 ENCOUNTER — Telehealth (INDEPENDENT_AMBULATORY_CARE_PROVIDER_SITE_OTHER): Payer: 59 | Admitting: Physician Assistant

## 2022-12-01 VITALS — Ht 66.0 in

## 2022-12-01 DIAGNOSIS — M797 Fibromyalgia: Secondary | ICD-10-CM

## 2022-12-01 DIAGNOSIS — F4310 Post-traumatic stress disorder, unspecified: Secondary | ICD-10-CM

## 2022-12-01 DIAGNOSIS — F411 Generalized anxiety disorder: Secondary | ICD-10-CM

## 2022-12-01 MED ORDER — CYANOCOBALAMIN 1000 MCG/ML IJ SOLN: INTRAMUSCULAR | 2 refills | Status: AC

## 2022-12-01 NOTE — Progress Notes (Signed)
   Virtual Visit via Video Note  I connected with  Abigail White  on 12/01/22 at 11:45 AM EDT by a video enabled telemedicine application and verified that I am speaking with the correct person using two identifiers.  Location: Patient: home Provider: Nature conservation officer at [employer] Persons present: Patient and myself   I discussed the limitations of evaluation and management by telemedicine and the availability of in person appointments. The patient expressed understanding and agreed to proceed.   History of Present Illness:  42 year old patient presenting for virtual visit to discuss follow-up on her anxiety, depression, fibromyalgia.  States that she is currently little more stressed than normal due to her mom currently in ICU due to UTI sepsis.  However, she feels like she has been doing better since completing her course at [facility], as well as ongoing physical therapy and counseling.  She meets with them each once weekly.  Goes back to work on Monday, 12/18/22, excited to return.  Returning full-time, but will need intermittent leave time for appointments for counseling and PT.    Observations/Objective:   Gen: Awake, alert, no acute distress Resp: Breathing is even and non-labored Psych: calm/pleasant demeanor Neuro: Alert and Oriented x 3, + facial symmetry, speech is clear.   Assessment and Plan:  Problem List Items Addressed This Visit       Other   Fibromyalgia    Waxes and wanes  Currently taking Celebrex 200 mg BID Currently doing  PT twice per week x 6 weeks with [facility] Follows with Dr. Corliss Skains       Generalized anxiety disorder - Primary    Stable on Xanax 0.25 mg. One prescription of #30 lasts about 3-6 months. PDMP reviewed today, no red flags, filling appropriately.        PTSD (post-traumatic stress disorder)    Completed [place] outpatient program Counseling weekly  Doing well, stable       Work note completed  and sent to the patient allowing her to return to work full-time without restrictions, but accommodating for 4 hours/week for appointments.  Follow Up Instructions:    I discussed the assessment and treatment plan with the patient. The patient was provided an opportunity to ask questions and all were answered. The patient agreed with the plan and demonstrated an understanding of the instructions.   The patient was advised to call back or seek an in-person evaluation if the symptoms worsen or if the condition fails to improve as anticipated.  Iqra Rotundo M Emberlee Sortino, PA-C

## 2022-12-01 NOTE — Assessment & Plan Note (Signed)
Completed [place] [facility] Counseling weekly  Doing well, stable

## 2022-12-01 NOTE — Addendum Note (Signed)
Addended by: Ila Mcgill on: 12/01/2022 03:26 PM   Modules accepted: Orders

## 2022-12-01 NOTE — Assessment & Plan Note (Signed)
Stable on Xanax 0.25 mg. One prescription of #30 lasts about 3-6 months. PDMP reviewed today, no red flags, filling appropriately.

## 2022-12-01 NOTE — Assessment & Plan Note (Signed)
Waxes and wanes  Currently taking Celebrex 200 mg BID Currently doing  PT twice per week x 6 weeks with [facility] Follows with Dr. Corliss Skains

## 2022-12-14 ENCOUNTER — Encounter: Payer: Self-pay | Admitting: Physician Assistant

## 2023-01-16 ENCOUNTER — Ambulatory Visit: Payer: 59 | Admitting: Gastroenterology

## 2023-05-29 ENCOUNTER — Encounter: Payer: Self-pay | Admitting: Physician Assistant

## 2023-05-29 ENCOUNTER — Other Ambulatory Visit: Payer: Self-pay

## 2023-05-29 ENCOUNTER — Ambulatory Visit: Payer: 59 | Admitting: Physician Assistant

## 2023-05-29 ENCOUNTER — Other Ambulatory Visit: Payer: Self-pay | Admitting: Physician Assistant

## 2023-05-29 VITALS — BP 116/80 | HR 111 | Temp 97.3°F | Ht 66.0 in | Wt 186.4 lb

## 2023-05-29 DIAGNOSIS — E782 Mixed hyperlipidemia: Secondary | ICD-10-CM

## 2023-05-29 DIAGNOSIS — M7711 Lateral epicondylitis, right elbow: Secondary | ICD-10-CM

## 2023-05-29 DIAGNOSIS — Z0000 Encounter for general adult medical examination without abnormal findings: Secondary | ICD-10-CM

## 2023-05-29 DIAGNOSIS — E559 Vitamin D deficiency, unspecified: Secondary | ICD-10-CM

## 2023-05-29 DIAGNOSIS — E538 Deficiency of other specified B group vitamins: Secondary | ICD-10-CM

## 2023-05-29 DIAGNOSIS — Z8632 Personal history of gestational diabetes: Secondary | ICD-10-CM

## 2023-05-29 LAB — HEMOGLOBIN A1C: Hgb A1c MFr Bld: 5.9 % (ref 4.6–6.5)

## 2023-05-29 LAB — CBC WITH DIFFERENTIAL/PLATELET
Basophils Absolute: 0.1 10*3/uL (ref 0.0–0.1)
Basophils Relative: 1 % (ref 0.0–3.0)
Eosinophils Absolute: 0.2 10*3/uL (ref 0.0–0.7)
Eosinophils Relative: 2.4 % (ref 0.0–5.0)
HCT: 43.2 % (ref 36.0–46.0)
Hemoglobin: 14.5 g/dL (ref 12.0–15.0)
Lymphocytes Relative: 22.7 % (ref 12.0–46.0)
Lymphs Abs: 1.7 10*3/uL (ref 0.7–4.0)
MCHC: 33.6 g/dL (ref 30.0–36.0)
MCV: 94.8 fL (ref 78.0–100.0)
Monocytes Absolute: 0.5 10*3/uL (ref 0.1–1.0)
Monocytes Relative: 7.2 % (ref 3.0–12.0)
Neutro Abs: 5 10*3/uL (ref 1.4–7.7)
Neutrophils Relative %: 66.7 % (ref 43.0–77.0)
Platelets: 323 10*3/uL (ref 150.0–400.0)
RBC: 4.56 Mil/uL (ref 3.87–5.11)
RDW: 13 % (ref 11.5–15.5)
WBC: 7.4 10*3/uL (ref 4.0–10.5)

## 2023-05-29 LAB — COMPREHENSIVE METABOLIC PANEL
ALT: 16 U/L (ref 0–35)
AST: 16 U/L (ref 0–37)
Albumin: 4 g/dL (ref 3.5–5.2)
Alkaline Phosphatase: 57 U/L (ref 39–117)
BUN: 11 mg/dL (ref 6–23)
CO2: 24 meq/L (ref 19–32)
Calcium: 8.7 mg/dL (ref 8.4–10.5)
Chloride: 107 meq/L (ref 96–112)
Creatinine, Ser: 0.61 mg/dL (ref 0.40–1.20)
GFR: 110.4 mL/min (ref >60.00)
Glucose, Bld: 91 mg/dL (ref 70–99)
Potassium: 3.9 meq/L (ref 3.5–5.1)
Sodium: 138 meq/L (ref 135–145)
Total Bilirubin: 0.4 mg/dL (ref 0.2–1.2)
Total Protein: 6.5 g/dL (ref 6.0–8.3)

## 2023-05-29 LAB — LIPID PANEL
Cholesterol: 226 mg/dL — ABNORMAL HIGH (ref 0–200)
HDL: 46.7 mg/dL (ref >39.00)
LDL Cholesterol: 156 mg/dL — ABNORMAL HIGH (ref 0–99)
NonHDL: 179.13
Total CHOL/HDL Ratio: 5
Triglycerides: 114 mg/dL (ref 0.0–149.0)
VLDL: 22.8 mg/dL (ref 0.0–40.0)

## 2023-05-29 LAB — VITAMIN D 25 HYDROXY (VIT D DEFICIENCY, FRACTURES): VITD: 19.88 ng/mL — ABNORMAL LOW (ref 30.00–100.00)

## 2023-05-29 LAB — VITAMIN B12: Vitamin B-12: 383 pg/mL (ref 211–911)

## 2023-05-29 LAB — TSH: TSH: 1.28 u[IU]/mL (ref 0.35–5.50)

## 2023-05-29 NOTE — Addendum Note (Signed)
Addended by: Royann Shivers on: 05/29/2023 10:28 AM   Modules accepted: Orders

## 2023-05-29 NOTE — Progress Notes (Signed)
Patient ID: Abigail White, female    DOB: 1980/10/31, 43 y.o.   MRN: [id number]   Assessment & Plan:  Right lateral epicondylitis -     Ambulatory referral to Sports Medicine      Lateral Epicondylitis Chronic elbow pain with limited range of motion and tenderness on palpation. No history of injury. Pain exacerbated by stress. Patient has been self-managing with Tylenol, ibuprofen, and occasionally Tramadol. Celebrex was discontinued due to stomach upset. -Refer to sports medicine for possible ultrasound-guided steroid injection. -Recommend consistent use of Voltaren gel 3-4 times daily. -Advise purchase and use of compression brace.   General Health Maintenance / Followup Plans -Plan for annual A1C check due to history of gestational diabetes and strong familial history of diabetes. -Order labs including Vitamin D, B12, and A1C for upcoming physical in March. -Schedule follow-up appointment for physical in March.         Subjective:    Chief Complaint  Patient presents with   Elbow Injury    Pt in office c/o right elbow pain and not getting any better; pt says its feeling better now but horrible for the past 6 months; pt denies injury/fall; ROM is difficult and gets stiff, sometimes has swelling     HPI Discussed the use of AI scribe software for clinical note transcription with the patient, who gave verbal consent to proceed.  History of Present Illness   The patient, with a history of chronic pain, presents with persistent left elbow pain that has worsened since returning to work. The pain is constant, with some days worse than others, and is associated with stiffness and limited range of motion. The patient reports taking over-the-counter analgesics daily for pain management, but had to discontinue Celebrex due to stomach upset. She occasionally uses Tramadol, originally prescribed for neck pain, for relief of the elbow pain. The patient denies any injury to the  elbow, but notes that stress seems to exacerbate the pain. She also reports intermittent swelling in both elbows, described as fluid-filled and transient, which was previously evaluated by a dermatologist and thought to be cystic. The patient also reports redness over the affected area.  In addition to the elbow pain, the patient reports swelling in her hand, which has necessitated removal of her ring. She has tried heat and ibuprofen for relief, and occasionally uses Tramadol when the pain is severe. She has not tried an elbow brace, but is open to the idea. The patient also mentions using Voltaren gel intermittently for pain relief.  The patient has been dealing with significant personal stress, including the recent loss of her mother and a missing cousin. She reports nightmares and increased anxiety, which may be contributing to her physical symptoms. Despite these challenges, the patient is continuing to work.       Past Medical History:  Diagnosis Date   ADHD (attention deficit hyperactivity disorder)    Allergy    Anemia    Anxiety    H/O   Carpal tunnel syndrome    bilateral   Chest pain    H/O - no current problems - anxiety related   Chronic fatigue    Depression    Gestational   Dyspnea    GERD (gastroesophageal reflux disease)    History of chickenpox    HSV (herpes simplex virus) infection    Hx of pyelonephritis    Migraine    Ovarian cyst    Palpitations    H/O - no current  problems-anxiety related   PONV (postoperative nausea and vomiting)    Postpartum care following vaginal delivery (2/13) 06/21/2016   PTSD (post-traumatic stress disorder)    Smoker    Spontaneous vaginal delivery 06/21/2016   svd x 3   Vaginal Pap smear, abnormal     Past Surgical History:  Procedure Laterality Date   LEEP  2005, 2013   x 2   NASAL SINUS SURGERY  1999   WISDOM TOOTH EXTRACTION      Family History  Problem Relation Age of Onset   Coronary artery disease Mother 3    Hypertension Mother    Diabetes Mother    Heart disease Mother    Kidney Stones Mother    Asthma Mother    Kidney disease Mother    Hyperthyroidism Mother    COPD Mother    Heart attack Mother    Dementia Mother    Hyperlipidemia Mother    Arthritis Mother    Fibromyalgia Mother    Obesity Mother    Other Father        Shot-violence   Alcohol abuse Father    Hypertension Brother    Kidney Stones Brother    Hyperlipidemia Brother    COPD Brother    Heart attack Brother    Diabetes Brother    Drug abuse Brother    Hypertension Brother    High Cholesterol Brother    Obesity Brother    Obesity Brother    Obesity Brother    Hypertension Maternal Grandmother    Hyperlipidemia Maternal Grandmother    Cancer Maternal Grandmother        Skin   COPD Maternal Grandmother    Diabetes Maternal Grandmother    Cancer Maternal Grandfather        Bone   Cancer Paternal Grandmother    Stroke Paternal Grandmother    Aneurysm Paternal Grandfather    Hypertension Other    Diabetes Other    Asthma Son    Hypertension Maternal Aunt    COPD Maternal Aunt    Diabetes Maternal Aunt    Heart attack Maternal Aunt    Cancer Paternal Aunt        Breast   Asthma Son    Asthma Daughter     Social History   Tobacco Use   Smoking status: Every Day    Current packs/day: 1.00    Average packs/day: 1 pack/day for 20.0 years (20.0 ttl pk-yrs)    Types: Cigarettes   Smokeless tobacco: Never  Vaping Use   Vaping status: Never Used  Substance Use Topics   Alcohol use: Not Currently   Drug use: No     Allergies  Allergen Reactions   Effexor [Venlafaxine]     Multiple somatic issues   Lexapro [Escitalopram Oxalate]     Mania    Paxil [Paroxetine Hcl]     Feels detached    Penicillins Hives    Has patient had a PCN reaction causing immediate rash, facial/tongue/throat swelling, SOB or lightheadedness with hypotension: no Has patient had a PCN reaction causing severe rash involving  mucus membranes or skin necrosis: yes Has patient had a PCN reaction that required hospitalization no Has patient had a PCN reaction occurring within the last 10 years: no If all of the above answers are "NO", then may proceed with Cephalosporin use.    Sulfa Antibiotics    Vicodin [Hydrocodone-Acetaminophen] Nausea And Vomiting    Projectile vomiting   Wellbutrin [Bupropion]  Became belligerant   Zoloft [Sertraline Hcl] Hives    rash   Differin [Adapalene] Rash   Doxycycline Rash   Lamictal [Lamotrigine] Rash   Latex Rash    Review of Systems NEGATIVE UNLESS OTHERWISE INDICATED IN HPI      Objective:     BP 116/80 (BP Location: Left Arm, Patient Position: Sitting)   Pulse (!) 111   Temp (!) 97.3 F (36.3 C) (Temporal)   Ht 5\' 6"  (1.676 m)   Wt 186 lb 6.4 oz (84.6 kg)   SpO2 97%   BMI 30.09 kg/m   Wt Readings from Last 3 Encounters:  05/29/23 186 lb 6.4 oz (84.6 kg)  11/16/22 180 lb 3.2 oz (81.7 kg)  10/10/22 181 lb 3.2 oz (82.2 kg)    BP Readings from Last 3 Encounters:  05/29/23 116/80  11/16/22 108/74  10/10/22 102/68     Physical Exam Vitals and nursing note reviewed.  Constitutional:      Appearance: Normal appearance.  Musculoskeletal:        General: Swelling and tenderness (R lateral epicondyle; some limited ROM due to pain) present.  Skin:    General: Skin is dry.     Findings: No lesion or rash.  Neurological:     Mental Status: She is alert.  Psychiatric:     Comments: Tearful at times      Bary Leriche, PA-C

## 2023-05-30 NOTE — Telephone Encounter (Signed)
Please see patient messages as Abigail White and also advise any suggestions for further labs/tests for patient.

## 2023-05-31 NOTE — Telephone Encounter (Signed)
Will discuss at her upcoming visit.

## 2023-06-01 ENCOUNTER — Encounter: Payer: Self-pay | Admitting: Physician Assistant

## 2023-06-01 ENCOUNTER — Other Ambulatory Visit: Payer: Self-pay | Admitting: Physician Assistant

## 2023-06-01 MED ORDER — VITAMIN D (ERGOCALCIFEROL) 1.25 MG (50000 UNIT) PO CAPS: 50000.0000 [IU] | ORAL_CAPSULE | ORAL | 0 refills | Status: DC

## 2023-07-16 ENCOUNTER — Encounter: Payer: Self-pay | Admitting: Physician Assistant

## 2023-07-17 ENCOUNTER — Other Ambulatory Visit: Payer: Self-pay

## 2023-07-17 DIAGNOSIS — E041 Nontoxic single thyroid nodule: Secondary | ICD-10-CM

## 2023-07-17 NOTE — Telephone Encounter (Signed)
 Please see pt msg/ concerns regarding recent MRI and nodule found. Advise if anything further is needed

## 2023-07-18 ENCOUNTER — Ambulatory Visit: Payer: 59 | Admitting: Physician Assistant

## 2023-07-18 VITALS — BP 120/74 | HR 101 | Temp 98.6°F | Ht 66.0 in | Wt 190.4 lb

## 2023-07-18 DIAGNOSIS — F410 Panic disorder [episodic paroxysmal anxiety] without agoraphobia: Secondary | ICD-10-CM | POA: Diagnosis not present

## 2023-07-18 DIAGNOSIS — F4310 Post-traumatic stress disorder, unspecified: Secondary | ICD-10-CM

## 2023-07-18 DIAGNOSIS — M5412 Radiculopathy, cervical region: Secondary | ICD-10-CM | POA: Insufficient documentation

## 2023-07-18 DIAGNOSIS — F411 Generalized anxiety disorder: Secondary | ICD-10-CM | POA: Diagnosis not present

## 2023-07-18 DIAGNOSIS — Z0000 Encounter for general adult medical examination without abnormal findings: Secondary | ICD-10-CM | POA: Diagnosis not present

## 2023-07-18 DIAGNOSIS — G4700 Insomnia, unspecified: Secondary | ICD-10-CM | POA: Diagnosis not present

## 2023-07-18 DIAGNOSIS — R635 Abnormal weight gain: Secondary | ICD-10-CM

## 2023-07-18 DIAGNOSIS — E041 Nontoxic single thyroid nodule: Secondary | ICD-10-CM

## 2023-07-18 DIAGNOSIS — E559 Vitamin D deficiency, unspecified: Secondary | ICD-10-CM

## 2023-07-18 DIAGNOSIS — J4520 Mild intermittent asthma, uncomplicated: Secondary | ICD-10-CM

## 2023-07-18 MED ORDER — ALBUTEROL SULFATE HFA 108 (90 BASE) MCG/ACT IN AERS: 2.0000 | INHALATION_SPRAY | Freq: Four times a day (QID) | RESPIRATORY_TRACT | 2 refills | Status: DC | PRN

## 2023-07-18 MED ORDER — LEVOCETIRIZINE DIHYDROCHLORIDE 5 MG PO TABS: 5.0000 mg | ORAL_TABLET | Freq: Every evening | ORAL | 3 refills | Status: AC

## 2023-07-18 MED ORDER — AIRSUPRA 90-80 MCG/ACT IN AERO: 2.0000 | INHALATION_SPRAY | Freq: Four times a day (QID) | RESPIRATORY_TRACT | 2 refills | Status: DC | PRN

## 2023-07-18 MED ORDER — IBUPROFEN 800 MG PO TABS: 800.0000 mg | ORAL_TABLET | Freq: Three times a day (TID) | ORAL | 0 refills | Status: DC | PRN

## 2023-07-18 MED ORDER — VALACYCLOVIR HCL 500 MG PO TABS: 500.0000 mg | ORAL_TABLET | ORAL | 2 refills | Status: AC | PRN

## 2023-07-18 MED ORDER — PANTOPRAZOLE SODIUM 40 MG PO TBEC: 40.0000 mg | DELAYED_RELEASE_TABLET | Freq: Two times a day (BID) | ORAL | 3 refills | Status: AC

## 2023-07-18 MED ORDER — ALPRAZOLAM 0.25 MG PO TABS: ORAL_TABLET | ORAL | 0 refills | Status: AC

## 2023-07-18 NOTE — Progress Notes (Signed)
 Patient ID: Abigail White, female    DOB: 10-Oct-1980, 43 y.o.   MRN: [id number]   Assessment & Plan:  Encounter for annual physical exam  Generalized anxiety disorder -     ALPRAZolam; Take 1/2 to one tablet daily as needed for anxiety/panic attacks.  Dispense: 20 tablet; Refill: 0  Panic attacks -     ALPRAZolam; Take 1/2 to one tablet daily as needed for anxiety/panic attacks.  Dispense: 20 tablet; Refill: 0  PTSD (post-traumatic stress disorder) -     ALPRAZolam; Take 1/2 to one tablet daily as needed for anxiety/panic attacks.  Dispense: 20 tablet; Refill: 0  Insomnia, unspecified type -     ALPRAZolam; Take 1/2 to one tablet daily as needed for anxiety/panic attacks.  Dispense: 20 tablet; Refill: 0  Other orders -     Ibuprofen; Take 1 tablet (800 mg total) by mouth every 8 (eight) hours as needed for cramping. Take with food.  Dispense: 90 tablet; Refill: 0 -     Airsupra; Inhale 2 puffs into the lungs every 6 (six) hours as needed.  Dispense: 10.7 g; Refill: 2 -     Albuterol Sulfate HFA; Inhale 2 puffs into the lungs every 6 (six) hours as needed.  Dispense: 18 g; Refill: 2 -     Pantoprazole Sodium; Take 1 tablet (40 mg total) by mouth 2 (two) times daily before a meal.  Dispense: 60 tablet; Refill: 3 -     Levocetirizine Dihydrochloride; Take 1 tablet (5 mg total) by mouth every evening.  Dispense: 90 tablet; Refill: 3 -     valACYclovir HCl; Take 1 tablet (500 mg total) by mouth as needed. Take 1 tablet as needed for outbreak  Dispense: 30 tablet; Refill: 2     Age-appropriate screening and counseling performed today. Reviewed labs with patient. Preventive measures discussed and printed in AVS for patient.   Patient Counseling: [x]   Nutrition: Stressed importance of moderation in sodium/caffeine intake, saturated fat and cholesterol, caloric balance, sufficient intake of fresh fruits, vegetables, and fiber.  [x]   Stressed the importance of regular exercise.   []    Substance Abuse: Discussed cessation/primary prevention of tobacco, alcohol, or other drug use; driving or other dangerous activities under the influence; availability of treatment for abuse.   []   Injury prevention: Discussed safety belts, safety helmets, smoke detector, smoking near bedding or upholstery.   []   Sexuality: Discussed sexually transmitted diseases, partner selection, use of condoms, avoidance of unintended pregnancy  and contraceptive alternatives.   [x]   Dental health: Discussed importance of regular tooth brushing, flossing, and dental visits.  [x]   Health maintenance and immunizations reviewed. Please refer to Health maintenance section.        Cervical Radiculopathy Chronic neck pain with arm radiation, MRI shows C6-C7 issues. Discussed epidural steroid injection as conservative measure before surgery. Explained potential bone density decrease with frequent injections. - Administer one epidural steroid injection in the neck. - Refer to Dr. Shon Baton, neurosurgeon, for surgical consultation.  Asthma Recent exacerbations. Discussed new inhaler with albuterol and budesonide to reduce inflammation and prevent exacerbations. - Prescribe Air Supra inhaler with albuterol and budesonide. - Provide coupon for [employer] inhaler. - Prescribe albuterol inhaler as backup.  Thyroid Nodule Thyroid nodule on MRI, history of previous nodule. Discussed further evaluation importance. - Order thyroid ultrasound to evaluate the nodule.  Anxiety and Depression Managed with counseling. Reports increased stress and emotional burden. - Stable on Xanax 0.25 mg prn for  panic, rarely takes these, #20 should last a year PDMP reviewed today, no red flags, filling appropriately.   Obesity Significant weight gain, discussed lifestyle impact and insurance coverage for weight loss options. - Lobbyist for weight loss medication coverage.  Vitamin D Deficiency Chronic deficiency  managed with prescribed high dose x 12 weeks.  General Health Maintenance Up to date with gynecological exams and mammograms. Missed GI appointment recently. Discussed rescheduling importance and early pneumonia vaccination due to asthma. - Encourage rescheduling of GI appointment for colonoscopy. - Recommend pneumonia vaccination due to asthma history.  Follow-up Regular follow-up needed to monitor health issues and treatment efficacy. - Schedule follow-up appointment in six months.          Return in about 6 months (around 01/18/2024) for recheck/follow-up.    Subjective:    Chief Complaint  Patient presents with   Annual Exam    Pt is present for Annual Exam states has done had labs.    HPI Discussed the use of AI scribe software for clinical note transcription with the patient, who gave verbal consent to proceed.  History of Present Illness   The patient presents for an annual physical exam.  She experiences ongoing neck and shoulder pain, which she attributes to a possible spinal issue. She has a ten-year history of neck pain, with significant worsening over the past two years. Nerve conduction studies revealed issues at C8, and an MRI confirmed problems between C6 and C7. She has not yet received an epidural injection. She is concerned about the potential need for surgery and the impact of repeated injections on bone density, citing her brother's experience with spinal issues.  She has gained approximately 50 pounds over two years, now weighing 190 pounds. She attributes this to decreased physical activity due to pain and fatigue. She feels 'lazy' and 'tired all the time,' impacting her ability to maintain an active lifestyle.  She has a history of anxiety and depression, for which she works with a Veterinary surgeon. She takes ibuprofen 800 mg for pain, Tylenol, and occasionally Xanax for anxiety. She reports crying frequently and feeling overwhelmed by her  responsibilities.  She reports difficulty sleeping, often needing to change positions due to neck and arm pain.  She has a thyroid nodule that was previously identified and is concerned about its potential growth, especially after witnessing a colleague's experience with thyroid cancer.  She has a history of vitamin D deficiency and is currently taking 2000 units of vitamin D. She also reports elevated cholesterol levels.  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         Past Medical History:  Diagnosis Date   ADHD (attention deficit hyperactivity disorder)    Allergy    Anemia    Anxiety    H/O   Carpal tunnel syndrome    bilateral   Chest pain    H/O - no current problems - anxiety related   Chronic fatigue    Depression    Gestational   Dyspnea    GERD (gastroesophageal reflux disease)    History of chickenpox    HSV (herpes simplex virus) infection    Hx of pyelonephritis    Migraine    Ovarian cyst    Palpitations    H/O - no current problems-anxiety related   PONV (postoperative nausea and vomiting)    Postpartum care following vaginal delivery (2/13) 06/21/2016   PTSD (post-traumatic  stress disorder)    Smoker    Spontaneous vaginal delivery 06/21/2016   svd x 3   Vaginal Pap smear, abnormal     Past Surgical History:  Procedure Laterality Date   LEEP  2005, 2013   x 2   NASAL SINUS SURGERY  1999   WISDOM TOOTH EXTRACTION      Family History  Problem Relation Age of Onset   Coronary artery disease Mother 14   Hypertension Mother    Diabetes Mother    Heart disease Mother    Kidney Stones Mother    Asthma Mother    Kidney disease Mother    Hyperthyroidism Mother    COPD Mother    Heart attack Mother    Dementia Mother    Hyperlipidemia Mother    Arthritis Mother    Fibromyalgia Mother    Obesity Mother    Other Father        Shot-violence   Alcohol abuse Father     Hypertension Brother    Kidney Stones Brother    Hyperlipidemia Brother    COPD Brother    Heart attack Brother    Diabetes Brother    Drug abuse Brother    Hypertension Brother    High Cholesterol Brother    Obesity Brother    Obesity Brother    Obesity Brother    Hypertension Maternal Grandmother    Hyperlipidemia Maternal Grandmother    Cancer Maternal Grandmother        Skin   COPD Maternal Grandmother    Diabetes Maternal Grandmother    Cancer Maternal Grandfather        Bone   Cancer Paternal Grandmother    Stroke Paternal Grandmother    Aneurysm Paternal Grandfather    Hypertension Other    Diabetes Other    Asthma Son    Hypertension Maternal Aunt    COPD Maternal Aunt    Diabetes Maternal Aunt    Heart attack Maternal Aunt    Cancer Paternal Aunt        Breast   Asthma Son    Asthma Daughter     Social History   Tobacco Use   Smoking status: Every Day    Current packs/day: 1.00    Average packs/day: 1 pack/day for 20.0 years (20.0 ttl pk-yrs)    Types: Cigarettes   Smokeless tobacco: Never  Vaping Use   Vaping status: Never Used  Substance Use Topics   Alcohol use: Not Currently   Drug use: No     Allergies  Allergen Reactions   Effexor [Venlafaxine]     Multiple somatic issues   Lexapro [Escitalopram Oxalate]     Mania    Paxil [Paroxetine Hcl]     Feels detached    Penicillins Hives    Has patient had a PCN reaction causing immediate rash, facial/tongue/throat swelling, SOB or lightheadedness with hypotension: no Has patient had a PCN reaction causing severe rash involving mucus membranes or skin necrosis: yes Has patient had a PCN reaction that required hospitalization no Has patient had a PCN reaction occurring within the last 10 years: no If all of the above answers are "NO", then may proceed with Cephalosporin use.    Sulfa Antibiotics    Vicodin [Hydrocodone-Acetaminophen] Nausea And Vomiting    Projectile vomiting   Wellbutrin  [Bupropion]     Became belligerant   Zoloft [Sertraline Hcl] Hives    rash   Differin [Adapalene] Rash   Doxycycline Rash  Lamictal [Lamotrigine] Rash   Latex Rash    Review of Systems NEGATIVE UNLESS OTHERWISE INDICATED IN HPI      Objective:     BP 120/74   Pulse (!) 101   Temp 98.6 F (37 C) (Temporal)   Ht 5\' 6"  (1.676 m)   Wt 190 lb 6.4 oz (86.4 kg)   LMP  (LMP Unknown)   SpO2 98%   BMI 30.73 kg/m   Wt Readings from Last 3 Encounters:  07/18/23 190 lb 6.4 oz (86.4 kg)  05/29/23 186 lb 6.4 oz (84.6 kg)  11/16/22 180 lb 3.2 oz (81.7 kg)    BP Readings from Last 3 Encounters:  07/18/23 120/74  05/29/23 116/80  11/16/22 108/74     Physical Exam Vitals and nursing note reviewed.  Constitutional:      Appearance: Normal appearance. She is obese. She is not toxic-appearing.  HENT:     Head: Normocephalic and atraumatic.     Right Ear: External ear normal.     Left Ear: External ear normal.     Nose: Nose normal.     Mouth/Throat:     Mouth: Mucous membranes are moist.  Eyes:     Extraocular Movements: Extraocular movements intact.     Conjunctiva/sclera: Conjunctivae normal.     Pupils: Pupils are equal, round, and reactive to light.  Neck:     Thyroid: Thyromegaly present. No thyroid tenderness.  Cardiovascular:     Rate and Rhythm: Normal rate and regular rhythm.     Pulses: Normal pulses.     Heart sounds: Normal heart sounds.  Pulmonary:     Effort: Pulmonary effort is normal.     Breath sounds: Normal breath sounds.  Abdominal:     General: Abdomen is flat. Bowel sounds are normal.     Palpations: Abdomen is soft.  Musculoskeletal:        General: Normal range of motion.     Cervical back: Normal range of motion and neck supple.     Right lower leg: No edema.     Left lower leg: No edema.  Skin:    General: Skin is warm and dry.  Neurological:     General: No focal deficit present.     Mental Status: She is alert and oriented to person,  place, and time.  Psychiatric:        Mood and Affect: Mood normal.        Behavior: Behavior normal.        Thought Content: Thought content normal.        Judgment: Judgment normal.        Aulden Calise M Dajaun Goldring, PA-C

## 2023-07-25 ENCOUNTER — Ambulatory Visit
Admission: RE | Admit: 2023-07-25 | Discharge: 2023-07-25 | Disposition: A | Discharge status: Home / Self Care | Source: Ambulatory Visit | Attending: Physician Assistant | Admitting: Physician Assistant

## 2023-07-25 DIAGNOSIS — E041 Nontoxic single thyroid nodule: Secondary | ICD-10-CM

## 2023-08-21 ENCOUNTER — Other Ambulatory Visit: Payer: Self-pay | Admitting: Physician Assistant

## 2023-12-01 ENCOUNTER — Other Ambulatory Visit: Payer: Self-pay | Admitting: Physician Assistant

## 2023-12-25 ENCOUNTER — Other Ambulatory Visit: Payer: Self-pay

## 2023-12-25 ENCOUNTER — Ambulatory Visit
Admission: EM | Admit: 2023-12-25 | Discharge: 2023-12-25 | Disposition: A | Discharge status: Home / Self Care | Attending: Nurse Practitioner | Admitting: Nurse Practitioner

## 2023-12-25 ENCOUNTER — Encounter: Payer: Self-pay | Admitting: Emergency Medicine

## 2023-12-25 DIAGNOSIS — R0981 Nasal congestion: Secondary | ICD-10-CM | POA: Diagnosis not present

## 2023-12-25 LAB — POC SOFIA SARS ANTIGEN FIA: SARS Coronavirus 2 Ag: NEGATIVE

## 2023-12-25 MED ORDER — IPRATROPIUM-ALBUTEROL 0.5-2.5 (3) MG/3ML IN SOLN
3.0000 mL | Freq: Once | RESPIRATORY_TRACT | Status: AC
  Administered 2023-12-25: 3 mL via RESPIRATORY_TRACT

## 2023-12-25 MED ORDER — METHYLPREDNISOLONE SODIUM SUCC 125 MG IJ SOLR
125.0000 mg | Freq: Once | INTRAMUSCULAR | Status: AC
  Administered 2023-12-25: 125 mg via INTRAMUSCULAR

## 2023-12-25 MED ORDER — METHYLPREDNISOLONE 4 MG PO TBPK: ORAL_TABLET | ORAL | 0 refills | Status: DC

## 2023-12-25 MED ORDER — PSEUDOEPH-BROMPHEN-DM 30-2-10 MG/5ML PO SYRP: 5.0000 mL | ORAL_SOLUTION | Freq: Four times a day (QID) | ORAL | 0 refills | Status: DC | PRN

## 2023-12-25 NOTE — ED Provider Notes (Signed)
 [facility]    CSN: [id number] Arrival date & time: 12/25/23  1840      History   Chief Complaint Chief Complaint  Patient presents with   Nasal Congestion    HPI Abigail White is a 43 y.o. female.   The history is provided by the patient.   Presents with a 2-week history of nasal congestion, cough, sore throat, generalized bodyaches, and postnasal drainage.  Patient denies fever, chills, headache, ear pain, chest pain, abdominal pain, nausea, vomiting, diarrhea, or rash.  Patient states that she has also been experiencing wheezing and shortness of breath.  States that she did complete a telehealth visit last week and was prescribed Levaquin .  States that symptoms somewhat improved, but now she continues to have the shortness of breath and wheezing.  Patient states symptoms started after she had neck surgery in May.  States that she used her albuterol  inhaler prior to arrival.  Past Medical History:  Diagnosis Date   ADHD (attention deficit hyperactivity disorder)    Allergy    Anemia    Anxiety    H/O   Carpal tunnel syndrome    bilateral   Chest pain    H/O - no current problems - anxiety related   Chronic fatigue    Depression    Gestational   Dyspnea    GERD (gastroesophageal reflux disease)    History of chickenpox    HSV (herpes simplex virus) infection    Hx of pyelonephritis    Migraine    Ovarian cyst    Palpitations    H/O - no current problems-anxiety related   PONV (postoperative nausea and vomiting)    Postpartum care following vaginal delivery (2/13) 06/21/2016   PTSD (post-traumatic stress disorder)    Smoker    Spontaneous vaginal delivery 06/21/2016   svd x 3   Vaginal Pap smear, abnormal     Patient Active Problem List   Diagnosis Date Noted   Left cervical radiculopathy 07/18/2023   Mild intermittent asthma without complication 07/18/2023   Mixed hyperlipidemia 11/16/2022   Gastroesophageal reflux disease without esophagitis  05/18/2022   Generalized anxiety disorder 05/18/2022   PTSD (post-traumatic stress disorder) 05/18/2022   Fibromyalgia 11/15/2020   Chronic pain of multiple joints 11/15/2020   Thyromegaly 07/24/2017   Palpable lymph node 07/24/2017   Visit for preventive health examination 08/07/2016   Migraine without status migrainosus, not intractable 07/30/2016   Attention deficit disorder (ADD) 07/26/2016   Postpartum care following vaginal delivery (2/13) 06/21/2016   Nicotine addiction 12/02/2012   Anxiety and depression     Past Surgical History:  Procedure Laterality Date   BACK SURGERY     cervical disc fusion   LEEP  2005, 2013   x 2   NASAL SINUS SURGERY  1999   WISDOM TOOTH EXTRACTION      OB History     Gravida  4   Para  3   Term  3   Preterm  0   AB  1   Living  3      SAB  0   IAB  1   Ectopic  0   Multiple      Live Births  3            Home Medications    Prior to Admission medications   Medication Sig Start Date End Date Taking? Authorizing Provider  brompheniramine-pseudoephedrine-DM 30-2-10 MG/5ML syrup Take 5 mLs by mouth 4 (four) times  daily as needed. 12/25/23  Yes Leath-Warren, Etta PARAS, NP  methylPREDNISolone  (MEDROL  DOSEPAK) 4 MG TBPK tablet Take medication as directed. 12/25/23  Yes Leath-Warren, Etta PARAS, NP  Acetaminophen  (TYLENOL  PO) Take by mouth as needed.    [provider]  albuterol  (PROAIR  HFA) 108 (90 Base) MCG/ACT inhaler Inhale 2 puffs into the lungs every 6 (six) hours as needed. 07/18/23   Allwardt, Alyssa M, PA-C  Albuterol -Budesonide (AIRSUPRA ) 90-80 MCG/ACT AERO Inhale 2 puffs into the lungs every 6 (six) hours as needed. 07/18/23   Allwardt, Alyssa M, PA-C  ALPRAZolam  (XANAX ) 0.25 MG tablet Take 1/2 to one tablet daily as needed for anxiety/panic attacks. 07/18/23   Allwardt, Alyssa M, PA-C  blood glucose meter kit and supplies KIT Dispense based on patient and insurance preference. Use up to four times daily as  needed to test glucose. 11/16/21   Allwardt, Alyssa M, PA-C  butalbital -acetaminophen -caffeine  (FIORICET ) 50-325-40 MG tablet Take 2 tablets by mouth 2 (two) times daily as needed for headache. 11/16/21   Allwardt, Mardy HERO, PA-C  ciprofloxacin  (CIPRO ) 500 MG tablet Take 500 mg by mouth 2 (two) times daily. 10/05/22   [provider]  cyanocobalamin  (VITAMIN B12) 1000 MCG/ML injection Inject Vit B12 Intramuscularly once a week x 4 weeks, then once a month x 3 months. 12/01/22   Allwardt, Alyssa M, PA-C  dicyclomine  (BENTYL ) 10 MG capsule Take 1 capsule (10 mg total) by mouth 4 (four) times daily -  before meals and at bedtime. 09/19/22   Allwardt, Alyssa M, PA-C  fluticasone  (FLOVENT  HFA) 110 MCG/ACT inhaler TAKE 2 PUFFS BY MOUTH TWICE A DAY 01/22/20   Gladis Elsie BROCKS, PA-C  ibuprofen  (ADVIL ) 800 MG tablet TAKE 1 TABLET (800 MG TOTAL) BY MOUTH EVERY 8 (EIGHT) HOURS AS NEEDED FOR CRAMPING. TAKE WITH FOOD. 08/22/23   Allwardt, Alyssa M, PA-C  L-Methylfolate-Algae (DEPLIN 7.5) 7.5-90.314 MG CAPS Take 1 capsule by mouth daily. Fax prescription to [facility]: [phone] 07/31/22   Allwardt, Alyssa M, PA-C  levocetirizine (XYZAL ) 5 MG tablet Take 1 tablet (5 mg total) by mouth every evening. 07/18/23   Allwardt, Alyssa M, PA-C  methocarbamol  (ROBAXIN ) 500 MG tablet Take 1 tablet (500 mg total) by mouth every 6 (six) hours as needed for muscle spasms. 11/16/22   Allwardt, Alyssa M, PA-C  metroNIDAZOLE (METROGEL) 1 % gel Apply topically as needed. 03/07/22   [provider]  ondansetron  (ZOFRAN ) 4 MG tablet Take 1 tablet (4 mg total) by mouth every 8 (eight) hours as needed for nausea or vomiting. 11/16/21   Allwardt, Alyssa M, PA-C  pantoprazole  (PROTONIX ) 40 MG tablet Take 1 tablet (40 mg total) by mouth 2 (two) times daily before a meal. 07/18/23   Allwardt, Alyssa M, PA-C  SYRINGE-NEEDLE, DISP, 3 ML (B-D 3CC LUER-LOK SYR 25GX1) 25G X 1 3 ML MISC USE TO INJECT VIT B12 AS DIRECTED 11/24/22    Allwardt, Alyssa M, PA-C  valACYclovir  (VALTREX ) 500 MG tablet Take 1 tablet (500 mg total) by mouth as needed. Take 1 tablet as needed for outbreak 07/18/23   Allwardt, Alyssa M, PA-C  Vitamin D , Ergocalciferol , (DRISDOL ) 1.25 MG (50000 UNIT) CAPS capsule TAKE 1 CAPSULE (50,000 UNITS TOTAL) BY MOUTH EVERY 7 (SEVEN) DAYS 08/24/22   Allwardt, Mardy HERO, PA-C  Vitamin D , Ergocalciferol , (DRISDOL ) 1.25 MG (50000 UNIT) CAPS capsule Take 1 capsule (50,000 Units total) by mouth every 7 (seven) days. 06/01/23   Allwardt, Mardy HERO, PA-C    Family History  Family History  Problem Relation Age of Onset   Coronary artery disease Mother 21   Hypertension Mother    Diabetes Mother    Heart disease Mother    Kidney Stones Mother    Asthma Mother    Kidney disease Mother    Hyperthyroidism Mother    COPD Mother    Heart attack Mother    Dementia Mother    Hyperlipidemia Mother    Arthritis Mother    Fibromyalgia Mother    Obesity Mother    Other Father        Shot-violence   Alcohol abuse Father    Hypertension Brother    Kidney Stones Brother    Hyperlipidemia Brother    COPD Brother    Heart attack Brother    Diabetes Brother    Drug abuse Brother    Hypertension Brother    High Cholesterol Brother    Obesity Brother    Obesity Brother    Obesity Brother    Hypertension Maternal Grandmother    Hyperlipidemia Maternal Grandmother    Cancer Maternal Grandmother        Skin   COPD Maternal Grandmother    Diabetes Maternal Grandmother    Cancer Maternal Grandfather        Bone   Cancer Paternal Grandmother    Stroke Paternal Grandmother    Aneurysm Paternal Grandfather    Hypertension Other    Diabetes Other    Asthma Son    Hypertension Maternal Aunt    COPD Maternal Aunt    Diabetes Maternal Aunt    Heart attack Maternal Aunt    Cancer Paternal Aunt        Breast   Asthma Son    Asthma Daughter     Social History Social History   Tobacco Use   Smoking status: Every  Day    Current packs/day: 1.00    Average packs/day: 1 pack/day for 20.0 years (20.0 ttl pk-yrs)    Types: Cigarettes   Smokeless tobacco: Never  Vaping Use   Vaping status: Never Used  Substance Use Topics   Alcohol use: Not Currently   Drug use: No     Allergies   Effexor [venlafaxine], Lexapro [escitalopram oxalate], Paxil [paroxetine hcl], Penicillins, Sulfa antibiotics, Vicodin [hydrocodone -acetaminophen ], Wellbutrin [bupropion], Zoloft [sertraline hcl], Differin [adapalene], Doxycycline , Lamictal  [lamotrigine ], and Latex   Review of Systems Review of Systems Per HPI  Physical Exam Triage Vital Signs ED Triage Vitals  Encounter Vitals Group     BP 12/25/23 1846 115/70     Girls Systolic BP Percentile --      Girls Diastolic BP Percentile --      Boys Systolic BP Percentile --      Boys Diastolic BP Percentile --      Pulse Rate 12/25/23 1846 88     Resp 12/25/23 1846 20     Temp 12/25/23 1846 98.2 F (36.8 C)     Temp Source 12/25/23 1846 Oral     SpO2 12/25/23 1846 96 %     Weight --      Height --      Head Circumference --      Peak Flow --      Pain Score 12/25/23 1850 5     Pain Loc --      Pain Education --      Exclude from Growth Chart --    No data found.  Updated Vital Signs BP 115/70 (BP Location:  Right Arm)   Pulse 88   Temp 98.2 F (36.8 C) (Oral)   Resp 20   SpO2 96%   Visual Acuity Right Eye Distance:   Left Eye Distance:   Bilateral Distance:    Right Eye Near:   Left Eye Near:    Bilateral Near:     Physical Exam Vitals and nursing note reviewed.  Constitutional:      General: She is not in acute distress.    Appearance: Normal appearance.  HENT:     Head: Normocephalic.     Right Ear: Tympanic membrane, ear canal and external ear normal.     Left Ear: Tympanic membrane, ear canal and external ear normal.     Nose: Congestion present.     Mouth/Throat:     Mouth: Mucous membranes are moist.  Eyes:     Extraocular  Movements: Extraocular movements intact.     Conjunctiva/sclera: Conjunctivae normal.     Pupils: Pupils are equal, round, and reactive to light.  Cardiovascular:     Rate and Rhythm: Normal rate and regular rhythm.     Pulses: Normal pulses.     Heart sounds: Normal heart sounds.  Pulmonary:     Effort: Pulmonary effort is normal.     Breath sounds: Wheezing (Posterior bilateral lower lobes) present.  Abdominal:     General: Bowel sounds are normal.     Palpations: Abdomen is soft.     Tenderness: There is no abdominal tenderness.  Musculoskeletal:     Cervical back: Normal range of motion.  Skin:    General: Skin is warm and dry.  Neurological:     General: No focal deficit present.     Mental Status: She is alert and oriented to person, place, and time.  Psychiatric:        Mood and Affect: Mood normal.        Behavior: Behavior normal.      UC Treatments / Results  Labs (all labs ordered are listed, but only abnormal results are displayed) Labs Reviewed  POC SOFIA SARS ANTIGEN FIA    EKG   Radiology No results found.  Procedures Procedures (including critical care time)  Medications Ordered in UC Medications  ipratropium-albuterol  (DUONEB) 0.5-2.5 (3) MG/3ML nebulizer solution 3 mL (3 mLs Nebulization Given 12/25/23 1927)  methylPREDNISolone  sodium succinate (SOLU-MEDROL ) 125 mg/2 mL injection 125 mg (125 mg Intramuscular Given 12/25/23 1926)    Initial Impression / Assessment and Plan / UC Course  I have reviewed the triage vital signs and the nursing notes.  Pertinent labs & imaging results that were available during my care of the patient were reviewed by me and considered in my medical decision making (see chart for details).  The COVID test was negative.  Patient completed Levaquin  within the past several days for acute sinusitis.  She continues to have nasal congestion, cough, shortness of breath, and wheezing.  On exam, she did have wheezing in the  posterior bilateral lower lobes.  DuoNeb was administered along with Solu-Medrol  125 mg IM.  Will avoid use of additional steroids at this time as patient was initially treated with Levaquin .  Will treat with methylprednisolone  4 mg taper.  Bromfed-DM also prescribed for cough.  Supportive care recommendations were provided and discussed with the patient to include over-the-counter analgesics, continuing her current allergy regimen, and following up with her PCP to discuss possible referral to ENT if symptoms fail to improve.  Patient was in agreement with this  plan of care and verbalizes understanding.  All questions were answered.  Patient stable for discharge.   Final Clinical Impressions(s) / UC Diagnoses   Final diagnoses:  Nasal congestion     Discharge Instructions      The COVID test was negative. You were given injection of Solu-Medrol  125 mg.  You also given a DuoNeb for your wheezing. Take medication as prescribed. Increase fluids and allow for plenty of rest. You may take over-the-counter Tylenol  or ibuprofen  as needed for pain, fever, or general discomfort. Continue to use his normal saline nasal spray throughout the day for nasal congestion and runny nose. For your cough and nasal congestion, it may be helpful to use a humidifier in your bedroom at nighttime during sleep and to sleep elevated on pillows while symptoms persist. As discussed, if symptoms fail to improve, please follow-up with your primary care physician to discuss possible referral to ENT. Follow-up as needed.     ED Prescriptions     Medication Sig Dispense Auth. Provider   methylPREDNISolone  (MEDROL  DOSEPAK) 4 MG TBPK tablet Take medication as directed. 21 tablet Leath-Warren, Etta PARAS, NP   brompheniramine-pseudoephedrine-DM 30-2-10 MG/5ML syrup Take 5 mLs by mouth 4 (four) times daily as needed. 140 mL Leath-Warren, Etta PARAS, NP      PDMP not reviewed this encounter.   Gilmer Etta PARAS,  NP 12/25/23 1954

## 2023-12-25 NOTE — ED Triage Notes (Addendum)
 Pt reports nasal congestion,cough,sore throat,generalized body aches, nasal drainage x2 weeks. has been taking xyzal , tylenol  with minimal change in symptoms. Was prescribed abx x1 week ago. Denies any known fevers. Inhaler prior to arrival at [facility].

## 2023-12-25 NOTE — Discharge Instructions (Addendum)
 The COVID test was negative. You were given injection of Solu-Medrol  125 mg.  You also given a DuoNeb for your wheezing. Take medication as prescribed. Increase fluids and allow for plenty of rest. You may take over-the-counter Tylenol  or ibuprofen  as needed for pain, fever, or general discomfort. Continue to use his normal saline nasal spray throughout the day for nasal congestion and runny nose. For your cough and nasal congestion, it may be helpful to use a humidifier in your bedroom at nighttime during sleep and to sleep elevated on pillows while symptoms persist. As discussed, if symptoms fail to improve, please follow-up with your primary care physician to discuss possible referral to ENT. Follow-up as needed.

## 2024-02-06 ENCOUNTER — Ambulatory Visit: Admitting: Family

## 2024-02-06 ENCOUNTER — Encounter: Payer: Self-pay | Admitting: Family

## 2024-02-06 ENCOUNTER — Ambulatory Visit: Payer: Self-pay

## 2024-02-06 VITALS — BP 138/82 | HR 98 | Temp 98.8°F | Ht 66.0 in | Wt 197.0 lb

## 2024-02-06 DIAGNOSIS — M797 Fibromyalgia: Secondary | ICD-10-CM

## 2024-02-06 DIAGNOSIS — M545 Low back pain, unspecified: Secondary | ICD-10-CM

## 2024-02-06 DIAGNOSIS — G8929 Other chronic pain: Secondary | ICD-10-CM | POA: Diagnosis not present

## 2024-02-06 DIAGNOSIS — M2550 Pain in unspecified joint: Secondary | ICD-10-CM

## 2024-02-06 DIAGNOSIS — M549 Dorsalgia, unspecified: Secondary | ICD-10-CM

## 2024-02-06 DIAGNOSIS — M5412 Radiculopathy, cervical region: Secondary | ICD-10-CM | POA: Diagnosis not present

## 2024-02-06 MED ORDER — METHOCARBAMOL 500 MG PO TABS: 500.0000 mg | ORAL_TABLET | Freq: Four times a day (QID) | ORAL | 0 refills | Status: AC | PRN

## 2024-02-06 NOTE — Telephone Encounter (Signed)
 FYI Only or Action Required?: FYI only for provider.  Patient was last seen in primary care on 07/18/2023 by Allwardt, Mardy HERO, PA-C.  Called Nurse Triage reporting Pain.  Symptoms began several days ago.  Interventions attempted: OTC medications: tylenol  ibuprofen .  Symptoms are: unchanged.  Triage Disposition: See HCP Within 4 Hours (Or PCP Triage)  Patient/caregiver understands and will follow disposition?: Yes  Copied from CRM #[id number]. Topic: Clinical - Red Word Triage >> Feb 06, 2024 12:13 PM Abigail White wrote: Red Word that prompted transfer to Nurse Triage: pain  Pt stated that she is experiencing pain from head to toe and has been experiencing this for 3 days. Pt would like to schedule an appt with any provider in the office and stated that she didn't want to go to the ER or UC.   ----------------------------------------------------------------------- From previous Reason for Contact - Scheduling: Patient/patient representative is calling to schedule an appointment. Refer to attachments for appointment information. Reason for Disposition  [1] SEVERE pain (e.g., excruciating, unable to do any normal activities) AND [2] not improved 2 hours after pain medicine  Answer Assessment - Initial Assessment Questions Pt calls in with body pain for last 2-3 days. States hx of fibromyalgia and states theres a possibility she has RA. States her PCP said next time she has a flare up like this to try to get into the office for evaluation and lab work. Pt states she's been taking tylenol  and ibuprofen  but with little relief.   1. ONSET: When did the muscle aches or body pains start?      3 days ago 2. LOCATION: What part of your body is hurting? (e.g., entire body, arms, legs)      States head to toe, entire body 3. SEVERITY: How bad is the pain? (Scale 1-10; or mild, moderate, severe)     7-8/10 4. CAUSE: What do you think is causing the pains?     Poss fibromyalgia/poss RA 5.  FEVER: Do you have a fever? If Yes, ask: What is your temperature, how was it measured, and  when did it start?      denies 6. OTHER SYMPTOMS: Do you have any other symptoms? (e.g., chest pain, cold or flu symptoms, rash, weakness, weight loss)     Denies 7. PREGNANCY: Is there any chance you are pregnant? When was your last menstrual period?     Denies Pregnancy; ablation in April 2024 8. TRAVEL: Have you traveled out of the country in the last month? (e.g., exposures, travel history)     denies  Dx fibro potentially RA; states pcp said next time she felt like this needs labs  Protocols used: Muscle Aches and Body Pain-A-AH

## 2024-02-06 NOTE — Telephone Encounter (Signed)
 FYI pt scheduled with [facility] 02/06/24

## 2024-02-06 NOTE — Telephone Encounter (Signed)
 Noted and agreed, thank you.

## 2024-02-06 NOTE — Progress Notes (Signed)
 Patient ID: Abigail White, female    DOB: [birth year], 43 y.o.   MRN: [id number]  Chief Complaint  Patient presents with   Joint Pain    Pt c/o generalized body pain, Present since Monday> Has tried tylneol and ibuprofen  which did help slightly   Discussed the use of AI scribe software for clinical note transcription with the patient, who gave verbal consent to proceed.  History of Present Illness   Abigail White is a 43 year old female with fibromyalgia who presents with worsening joint and muscle pain.  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 She has a slight fever of 99.73F today, with no respiratory symptoms but does have ear pain and a history of sinus issues. Her past medical history includes sinus and neck surgeries, with a C6, C7 replacement. She was seen by Rheumatology in the past, autoimmune panels all negative and given Fibromyalgia DX. Tried Celebrex  but did not tolerate.  Her current medications are ibuprofen  and Tylenol , with today's doses being 400 mg and 1000 mg, respectively. She tolerates ibuprofen  with food but has stomach issues with other NSAIDs. Tramadol  is effective but used cautiously due to addiction concerns. She has a history of a severe sinus infection treated with Levaquin  and a steroid shot, resulting in persistent ear pain. She takes Xyzal  for allergies and has a history of fluid in her ears.  Socially, she has gained weight since her mother's death last 01-10-2024, partly due to emotional eating and increased chocolate consumption.     Assessment and Plan    Fibromyalgia with acute pain flare and myalgia Acute fibromyalgia exacerbation with joint pain, muscle cramps, myalgia, brain fog, and exhaustion. Previous autoimmune workup negative. Current pain management insufficient. Advised in past to come in when having a pain flare to get labs done. - Order  CK, ESR, CRP, BMP and vitamin D  level. - Prescribed methocarbamol  500 mg, take two tablets at night. - Recommend dual-action acetaminophen  and ibuprofen  in order to take smaller doses of both meds. - Advise ibuprofen  up to 600mg  three times a day prn if not using dual-action medication, monitor for gastrointestinal side effects and blood pressure changes.  Chronic neck pain after C6-C7 cervical disc replacement Chronic neck pain post C6-C7 disc replacement, no acute changes.  Left ear pain Persistent left ear pain post sinus infection, no current infection signs. Possible residual effects from previous infection or fibromyalgia. Bilateral TM wnl on exam. - Consider ENT referral if symptoms persist or worsen.      Subjective:    Outpatient Medications Prior to Visit  Medication Sig Dispense Refill   Acetaminophen  (TYLENOL  PO) Take by mouth as needed.     albuterol  (PROAIR  HFA) 108 (90 Base) MCG/ACT inhaler Inhale 2 puffs into the lungs every 6 (six) hours as needed. 18 g 2   Albuterol -Budesonide (AIRSUPRA ) 90-80 MCG/ACT AERO Inhale 2 puffs into the lungs every 6 (six) hours as needed. 10.7 g 2   ALPRAZolam  (XANAX ) 0.25 MG tablet Take 1/2 to one tablet daily as needed for anxiety/panic attacks. 20 tablet 0   blood glucose meter kit and supplies KIT Dispense based on patient and insurance preference. Use up to four times daily as needed to test glucose. 1 each 0   cyanocobalamin  (VITAMIN B12) 1000 MCG/ML injection Inject Vit B12 Intramuscularly once a week x 4 weeks, then once a month x  3 months. 4 mL 2   fluticasone  (FLOVENT  HFA) 110 MCG/ACT inhaler TAKE 2 PUFFS BY MOUTH TWICE A DAY 12 g 2   ibuprofen  (ADVIL ) 800 MG tablet TAKE 1 TABLET (800 MG TOTAL) BY MOUTH EVERY 8 (EIGHT) HOURS AS NEEDED FOR CRAMPING. TAKE WITH FOOD. 90 tablet 0   levocetirizine (XYZAL ) 5 MG tablet Take 1 tablet (5 mg total) by mouth every evening. 90 tablet 3   methocarbamol  (ROBAXIN ) 500 MG tablet Take 1 tablet (500 mg  total) by mouth every 6 (six) hours as needed for muscle spasms. 30 tablet 0   methylPREDNISolone  (MEDROL  DOSEPAK) 4 MG TBPK tablet Take medication as directed. 21 tablet 0   metroNIDAZOLE (METROGEL) 1 % gel Apply topically as needed.     ondansetron  (ZOFRAN ) 4 MG tablet Take 1 tablet (4 mg total) by mouth every 8 (eight) hours as needed for nausea or vomiting. 20 tablet 0   pantoprazole  (PROTONIX ) 40 MG tablet Take 1 tablet (40 mg total) by mouth 2 (two) times daily before a meal. 60 tablet 3   SYRINGE-NEEDLE, DISP, 3 ML (B-D 3CC LUER-LOK SYR 25GX1) 25G X 1 3 ML MISC USE TO INJECT VIT B12 AS DIRECTED 7 each 0   traMADol  (ULTRAM ) 50 MG tablet Take 50-100 mg by mouth every 6 (six) hours as needed.     valACYclovir  (VALTREX ) 500 MG tablet Take 1 tablet (500 mg total) by mouth as needed. Take 1 tablet as needed for outbreak 30 tablet 2   Vitamin D , Ergocalciferol , (DRISDOL ) 1.25 MG (50000 UNIT) CAPS capsule TAKE 1 CAPSULE (50,000 UNITS TOTAL) BY MOUTH EVERY 7 (SEVEN) DAYS 12 capsule 1   brompheniramine-pseudoephedrine-DM 30-2-10 MG/5ML syrup Take 5 mLs by mouth 4 (four) times daily as needed. 140 mL 0   butalbital -acetaminophen -caffeine  (FIORICET ) 50-325-40 MG tablet Take 2 tablets by mouth 2 (two) times daily as needed for headache. 14 tablet 0   ciprofloxacin  (CIPRO ) 500 MG tablet Take 500 mg by mouth 2 (two) times daily.     dicyclomine  (BENTYL ) 10 MG capsule Take 1 capsule (10 mg total) by mouth 4 (four) times daily -  before meals and at bedtime. 45 capsule 2   L-Methylfolate-Algae (DEPLIN 7.5) 7.5-90.314 MG CAPS Take 1 capsule by mouth daily. Fax prescription to [facility]: [phone] 30 capsule 11   Vitamin D , Ergocalciferol , (DRISDOL ) 1.25 MG (50000 UNIT) CAPS capsule Take 1 capsule (50,000 Units total) by mouth every 7 (seven) days. 10 capsule 0   No facility-administered medications prior to visit.   Past Medical History:  Diagnosis Date   ADHD (attention deficit hyperactivity  disorder)    Allergy    Anemia    Anxiety    H/O   Carpal tunnel syndrome    bilateral   Chest pain    H/O - no current problems - anxiety related   Chronic fatigue    Depression    Gestational   Dyspnea    GERD (gastroesophageal reflux disease)    History of chickenpox    HSV (herpes simplex virus) infection    Hx of pyelonephritis    Migraine    Ovarian cyst    Palpitations    H/O - no current problems-anxiety related   PONV (postoperative nausea and vomiting)    Postpartum care following vaginal delivery (2/13) 06/21/2016   PTSD (post-traumatic stress disorder)    Smoker    Spontaneous vaginal delivery 06/21/2016   svd x 3   Vaginal Pap smear, abnormal  Past Surgical History:  Procedure Laterality Date   BACK SURGERY     cervical disc fusion   LEEP  2005, 2013   x 2   NASAL SINUS SURGERY  1999   WISDOM TOOTH EXTRACTION     Allergies  Allergen Reactions   Effexor [Venlafaxine]     Multiple somatic issues   Lexapro [Escitalopram Oxalate]     Mania    Paxil [Paroxetine Hcl]     Feels detached    Penicillins Hives    Has patient had a PCN reaction causing immediate rash, facial/tongue/throat swelling, SOB or lightheadedness with hypotension: no Has patient had a PCN reaction causing severe rash involving mucus membranes or skin necrosis: yes Has patient had a PCN reaction that required hospitalization no Has patient had a PCN reaction occurring within the last 10 years: no If all of the above answers are NO, then may proceed with Cephalosporin use.    Sulfa Antibiotics    Vicodin [Hydrocodone -Acetaminophen ] Nausea And Vomiting    Projectile vomiting   Wellbutrin [Bupropion]     Became belligerant   Zoloft [Sertraline Hcl] Hives    rash   Differin [Adapalene] Rash   Doxycycline  Rash   Lamictal  [Lamotrigine ] Rash   Latex Rash      Objective:    Physical Exam Vitals and nursing note reviewed.  Constitutional:      Appearance: Normal appearance.   HENT:     Right Ear: Tympanic membrane and ear canal normal.     Left Ear: Tympanic membrane and ear canal normal.  Cardiovascular:     Rate and Rhythm: Normal rate and regular rhythm.  Pulmonary:     Effort: Pulmonary effort is normal.     Breath sounds: Normal breath sounds.  Musculoskeletal:        General: Normal range of motion.  Lymphadenopathy:     Cervical: No cervical adenopathy.  Skin:    General: Skin is warm and dry.  Neurological:     Mental Status: She is alert.  Psychiatric:        Mood and Affect: Mood normal.        Behavior: Behavior normal.    BP 138/82 (BP Location: Left Arm, Patient Position: Sitting, Cuff Size: Normal)   Pulse 98   Temp 98.8 F (37.1 C) (Temporal)   Ht 5' 6 (1.676 m)   Wt 197 lb (89.4 kg)   SpO2 98%   BMI 31.80 kg/m  Wt Readings from Last 3 Encounters:  02/06/24 197 lb (89.4 kg)  07/18/23 190 lb 6.4 oz (86.4 kg)  05/29/23 186 lb 6.4 oz (84.6 kg)      Lucius Krabbe, NP

## 2024-02-07 LAB — BASIC METABOLIC PANEL WITH GFR
BUN: 8 mg/dL (ref 6–23)
CO2: 25 meq/L (ref 19–32)
Calcium: 9 mg/dL (ref 8.4–10.5)
Chloride: 104 meq/L (ref 96–112)
Creatinine, Ser: 0.65 mg/dL (ref 0.40–1.20)
GFR: 108.19 mL/min (ref >60.00)
Glucose, Bld: 96 mg/dL (ref 70–99)
Potassium: 3.8 meq/L (ref 3.5–5.1)
Sodium: 139 meq/L (ref 135–145)

## 2024-02-07 LAB — SEDIMENTATION RATE: Sed Rate: 18 mm/h (ref 0–20)

## 2024-02-07 LAB — CK: Total CK: 76 U/L (ref 17–177)

## 2024-02-07 LAB — VITAMIN D 25 HYDROXY (VIT D DEFICIENCY, FRACTURES): VITD: 20.46 ng/mL — ABNORMAL LOW (ref 30.00–100.00)

## 2024-02-07 LAB — C-REACTIVE PROTEIN: CRP: 2.2 mg/dL (ref 0.5–20.0)

## 2024-02-08 ENCOUNTER — Ambulatory Visit: Payer: Self-pay | Admitting: Family

## 2024-03-28 ENCOUNTER — Other Ambulatory Visit: Payer: Self-pay | Admitting: Physician Assistant

## 2024-03-28 ENCOUNTER — Other Ambulatory Visit: Payer: Self-pay

## 2024-03-28 ENCOUNTER — Telehealth

## 2024-03-28 ENCOUNTER — Encounter: Payer: Self-pay | Admitting: Physician Assistant

## 2024-03-28 DIAGNOSIS — E559 Vitamin D deficiency, unspecified: Secondary | ICD-10-CM

## 2024-03-28 MED ORDER — VITAMIN D (ERGOCALCIFEROL) 1.25 MG (50000 UNIT) PO CAPS: 50000.0000 [IU] | ORAL_CAPSULE | ORAL | 1 refills | Status: AC

## 2024-03-28 NOTE — Telephone Encounter (Signed)
 Looks like inhalers have already been sent today by another CMA. Please advise on refill of 49999 Vitamin D  Rx

## 2024-04-14 ENCOUNTER — Other Ambulatory Visit: Payer: Self-pay

## 2024-05-06 ENCOUNTER — Ambulatory Visit (INDEPENDENT_AMBULATORY_CARE_PROVIDER_SITE_OTHER)

## 2024-05-06 DIAGNOSIS — Z23 Encounter for immunization: Secondary | ICD-10-CM

## 2024-05-06 NOTE — Progress Notes (Signed)
 Patient is in office today for a nurse visit for Immunization. Patient Injection was given in the  Left deltoid. Patient tolerated injection well.

## 2024-07-21 ENCOUNTER — Encounter: Admitting: Physician Assistant
